# Patient Record
Sex: Female | Born: 1970 | Race: White | Hispanic: No | Smoking: Current every day smoker
Health system: Southern US, Community
[De-identification: ages and names within clinical notes are randomized; demographics above are authoritative.]

## PROBLEM LIST (undated history)

## (undated) ENCOUNTER — Emergency Department: Admission: EM | Payer: Self-pay | Source: Home / Self Care

## (undated) VITALS — BP 129/88 | HR 75 | Temp 97.7°F | Resp 18 | Ht 69.0 in | Wt 147.0 lb

## (undated) DIAGNOSIS — Z8669 Personal history of other diseases of the nervous system and sense organs: Secondary | ICD-10-CM

## (undated) DIAGNOSIS — R569 Unspecified convulsions: Secondary | ICD-10-CM

## (undated) DIAGNOSIS — G43909 Migraine, unspecified, not intractable, without status migrainosus: Secondary | ICD-10-CM

## (undated) DIAGNOSIS — G8929 Other chronic pain: Secondary | ICD-10-CM

## (undated) DIAGNOSIS — B019 Varicella without complication: Secondary | ICD-10-CM

## (undated) DIAGNOSIS — K219 Gastro-esophageal reflux disease without esophagitis: Secondary | ICD-10-CM

## (undated) DIAGNOSIS — G4700 Insomnia, unspecified: Secondary | ICD-10-CM

## (undated) DIAGNOSIS — F1910 Other psychoactive substance abuse, uncomplicated: Secondary | ICD-10-CM

## (undated) DIAGNOSIS — F419 Anxiety disorder, unspecified: Secondary | ICD-10-CM

## (undated) DIAGNOSIS — J449 Chronic obstructive pulmonary disease, unspecified: Secondary | ICD-10-CM

## (undated) DIAGNOSIS — M1990 Unspecified osteoarthritis, unspecified site: Secondary | ICD-10-CM

## (undated) DIAGNOSIS — F319 Bipolar disorder, unspecified: Secondary | ICD-10-CM

## (undated) DIAGNOSIS — M542 Cervicalgia: Secondary | ICD-10-CM

## (undated) DIAGNOSIS — F17200 Nicotine dependence, unspecified, uncomplicated: Secondary | ICD-10-CM

## (undated) DIAGNOSIS — Z789 Other specified health status: Secondary | ICD-10-CM

## (undated) DIAGNOSIS — E785 Hyperlipidemia, unspecified: Secondary | ICD-10-CM

## (undated) DIAGNOSIS — L0390 Cellulitis, unspecified: Secondary | ICD-10-CM

## (undated) DIAGNOSIS — F32A Depression, unspecified: Secondary | ICD-10-CM

## (undated) DIAGNOSIS — M549 Dorsalgia, unspecified: Secondary | ICD-10-CM

## (undated) DIAGNOSIS — F329 Major depressive disorder, single episode, unspecified: Secondary | ICD-10-CM

## (undated) DIAGNOSIS — E559 Vitamin D deficiency, unspecified: Secondary | ICD-10-CM

## (undated) HISTORY — DX: Insomnia, unspecified: G47.00

## (undated) HISTORY — DX: Unspecified convulsions: R56.9

## (undated) HISTORY — DX: Anxiety disorder, unspecified: F41.9

## (undated) HISTORY — DX: Chronic obstructive pulmonary disease, unspecified: J44.9

## (undated) HISTORY — DX: Cellulitis, unspecified: L03.90

## (undated) HISTORY — DX: Cervicalgia: M54.2

## (undated) HISTORY — DX: Migraine, unspecified, not intractable, without status migrainosus: G43.909

## (undated) HISTORY — DX: Vitamin D deficiency, unspecified: E55.9

## (undated) HISTORY — DX: Bipolar disorder, unspecified: F31.9

## (undated) HISTORY — PX: ESOPHAGOGASTRODUODENOSCOPY: SHX1529

## (undated) HISTORY — PX: ABDOMINAL HYSTERECTOMY: SHX81

## (undated) HISTORY — DX: Hyperlipidemia, unspecified: E78.5

## (undated) HISTORY — DX: Other psychoactive substance abuse, uncomplicated: F19.10

## (undated) HISTORY — DX: Varicella without complication: B01.9

## (undated) HISTORY — DX: Nicotine dependence, unspecified, uncomplicated: F17.200

## (undated) HISTORY — DX: Personal history of other diseases of the nervous system and sense organs: Z86.69

## (undated) HISTORY — PX: BREAST SURGERY: SHX581

## (undated) HISTORY — PX: CHOLECYSTECTOMY: SHX55

## (undated) HISTORY — DX: Gastro-esophageal reflux disease without esophagitis: K21.9

---

## 2000-03-18 ENCOUNTER — Encounter: Payer: Self-pay | Admitting: Gastroenterology

## 2000-03-18 ENCOUNTER — Ambulatory Visit (HOSPITAL_COMMUNITY): Admission: RE | Admit: 2000-03-18 | Discharge: 2000-03-18 | Payer: Self-pay | Admitting: Gastroenterology

## 2004-07-22 ENCOUNTER — Emergency Department: Payer: Self-pay | Admitting: Emergency Medicine

## 2004-09-11 ENCOUNTER — Emergency Department: Payer: Self-pay | Admitting: Emergency Medicine

## 2005-10-09 ENCOUNTER — Emergency Department: Payer: Self-pay | Admitting: Emergency Medicine

## 2005-11-24 ENCOUNTER — Ambulatory Visit: Payer: Self-pay | Admitting: Psychiatry

## 2005-11-24 ENCOUNTER — Inpatient Hospital Stay (HOSPITAL_COMMUNITY): Admission: EM | Admit: 2005-11-24 | Discharge: 2005-11-27 | Payer: Self-pay | Admitting: Psychiatry

## 2007-12-24 ENCOUNTER — Other Ambulatory Visit: Payer: Self-pay

## 2007-12-24 ENCOUNTER — Emergency Department: Payer: Self-pay | Admitting: Unknown Physician Specialty

## 2008-01-15 ENCOUNTER — Inpatient Hospital Stay (HOSPITAL_COMMUNITY): Admission: RE | Admit: 2008-01-15 | Discharge: 2008-01-19 | Payer: Self-pay | Admitting: *Deleted

## 2008-01-15 ENCOUNTER — Ambulatory Visit: Payer: Self-pay | Admitting: *Deleted

## 2008-02-19 ENCOUNTER — Ambulatory Visit: Payer: Self-pay | Admitting: Pain Medicine

## 2008-11-23 ENCOUNTER — Ambulatory Visit: Payer: Self-pay | Admitting: Pulmonary Disease

## 2008-11-23 ENCOUNTER — Inpatient Hospital Stay (HOSPITAL_COMMUNITY): Admission: EM | Admit: 2008-11-23 | Discharge: 2008-11-25 | Payer: Self-pay | Admitting: Emergency Medicine

## 2008-11-25 ENCOUNTER — Ambulatory Visit: Payer: Self-pay | Admitting: *Deleted

## 2008-11-25 ENCOUNTER — Inpatient Hospital Stay (HOSPITAL_COMMUNITY): Admission: AD | Admit: 2008-11-25 | Discharge: 2008-12-01 | Payer: Self-pay | Admitting: *Deleted

## 2009-01-25 ENCOUNTER — Emergency Department: Payer: Self-pay | Admitting: Emergency Medicine

## 2009-03-06 ENCOUNTER — Emergency Department: Payer: Self-pay | Admitting: Emergency Medicine

## 2009-03-08 ENCOUNTER — Ambulatory Visit: Payer: Self-pay | Admitting: Unknown Physician Specialty

## 2009-03-24 ENCOUNTER — Inpatient Hospital Stay (HOSPITAL_COMMUNITY): Admission: EM | Admit: 2009-03-24 | Discharge: 2009-04-02 | Payer: Self-pay | Admitting: Pulmonary Disease

## 2009-03-24 ENCOUNTER — Ambulatory Visit: Payer: Self-pay | Admitting: Pulmonary Disease

## 2009-07-04 ENCOUNTER — Emergency Department (HOSPITAL_COMMUNITY): Admission: EM | Admit: 2009-07-04 | Discharge: 2009-07-04 | Payer: Self-pay | Admitting: Emergency Medicine

## 2009-08-17 ENCOUNTER — Emergency Department: Payer: Self-pay | Admitting: Emergency Medicine

## 2009-09-12 ENCOUNTER — Ambulatory Visit: Payer: Self-pay | Admitting: Unknown Physician Specialty

## 2009-09-13 ENCOUNTER — Emergency Department: Payer: Self-pay | Admitting: Emergency Medicine

## 2009-10-06 ENCOUNTER — Ambulatory Visit: Payer: Self-pay | Admitting: Unknown Physician Specialty

## 2010-02-07 ENCOUNTER — Emergency Department: Payer: Self-pay | Admitting: Emergency Medicine

## 2011-01-12 LAB — PREGNANCY, URINE: Preg Test, Ur: NEGATIVE

## 2011-01-12 LAB — URINALYSIS, ROUTINE W REFLEX MICROSCOPIC
Bilirubin Urine: NEGATIVE
Glucose, UA: NEGATIVE mg/dL
Ketones, ur: NEGATIVE mg/dL
Protein, ur: NEGATIVE mg/dL
pH: 6 (ref 5.0–8.0)

## 2011-01-12 LAB — POCT I-STAT, CHEM 8
Calcium, Ion: 1.03 mmol/L — ABNORMAL LOW (ref 1.12–1.32)
HCT: 42 % (ref 36.0–46.0)
Hemoglobin: 14.3 g/dL (ref 12.0–15.0)
Sodium: 141 mEq/L (ref 135–145)
TCO2: 23 mmol/L (ref 0–100)

## 2011-01-12 LAB — ETHANOL: Alcohol, Ethyl (B): 18 mg/dL — ABNORMAL HIGH (ref 0–10)

## 2011-01-12 LAB — URINE MICROSCOPIC-ADD ON

## 2011-01-12 LAB — RAPID URINE DRUG SCREEN, HOSP PERFORMED
Benzodiazepines: POSITIVE — AB
Cocaine: POSITIVE — AB
Opiates: POSITIVE — AB

## 2011-01-12 LAB — POCT CARDIAC MARKERS: Myoglobin, poc: 53.7 ng/mL (ref 12–200)

## 2011-01-15 LAB — BASIC METABOLIC PANEL
BUN: 2 mg/dL — ABNORMAL LOW (ref 6–23)
BUN: 2 mg/dL — ABNORMAL LOW (ref 6–23)
CO2: 26 mEq/L (ref 19–32)
CO2: 27 mEq/L (ref 19–32)
CO2: 28 mEq/L (ref 19–32)
CO2: 30 mEq/L (ref 19–32)
CO2: 31 mEq/L (ref 19–32)
Calcium: 8.4 mg/dL (ref 8.4–10.5)
Calcium: 8.6 mg/dL (ref 8.4–10.5)
Chloride: 102 mEq/L (ref 96–112)
Chloride: 103 mEq/L (ref 96–112)
Creatinine, Ser: 0.54 mg/dL (ref 0.4–1.2)
Creatinine, Ser: 0.58 mg/dL (ref 0.4–1.2)
GFR calc Af Amer: >60 mL/min (ref >60)
GFR calc Af Amer: >60 mL/min (ref >60)
GFR calc Af Amer: >60 mL/min (ref >60)
GFR calc non Af Amer: >60 mL/min (ref >60)
GFR calc non Af Amer: >60 mL/min (ref >60)
Glucose, Bld: 117 mg/dL — ABNORMAL HIGH (ref 70–99)
Glucose, Bld: 87 mg/dL (ref 70–99)
Potassium: 3.3 mEq/L — ABNORMAL LOW (ref 3.5–5.1)
Potassium: 4.3 mEq/L (ref 3.5–5.1)
Sodium: 137 mEq/L (ref 135–145)
Sodium: 137 mEq/L (ref 135–145)
Sodium: 139 mEq/L (ref 135–145)

## 2011-01-15 LAB — URINE CULTURE: Culture: NO GROWTH

## 2011-01-15 LAB — RETICULOCYTES
RBC.: 3.05 MIL/uL — ABNORMAL LOW (ref 3.87–5.11)
Retic Ct Pct: 0.6 % (ref 0.4–3.1)

## 2011-01-15 LAB — CBC
HCT: 27.6 % — ABNORMAL LOW (ref 36.0–46.0)
HCT: 28.8 % — ABNORMAL LOW (ref 36.0–46.0)
HCT: 29.3 % — ABNORMAL LOW (ref 36.0–46.0)
HCT: 32.2 % — ABNORMAL LOW (ref 36.0–46.0)
Hemoglobin: 10.7 g/dL — ABNORMAL LOW (ref 12.0–15.0)
Hemoglobin: 10.8 g/dL — ABNORMAL LOW (ref 12.0–15.0)
Hemoglobin: 9.8 g/dL — ABNORMAL LOW (ref 12.0–15.0)
Hemoglobin: 9.9 g/dL — ABNORMAL LOW (ref 12.0–15.0)
MCHC: 33.1 g/dL (ref 30.0–36.0)
MCHC: 33.4 g/dL (ref 30.0–36.0)
MCHC: 33.9 g/dL (ref 30.0–36.0)
MCHC: 34.1 g/dL (ref 30.0–36.0)
MCHC: 34.1 g/dL (ref 30.0–36.0)
MCV: 91.7 fL (ref 78.0–100.0)
MCV: 91.9 fL (ref 78.0–100.0)
MCV: 92.8 fL (ref 78.0–100.0)
MCV: 93.3 fL (ref 78.0–100.0)
MCV: 93.6 fL (ref 78.0–100.0)
Platelets: 376 10*3/uL (ref 150–400)
Platelets: 399 10*3/uL (ref 150–400)
RBC: 2.93 MIL/uL — ABNORMAL LOW (ref 3.87–5.11)
RBC: 3.1 MIL/uL — ABNORMAL LOW (ref 3.87–5.11)
RBC: 3.16 MIL/uL — ABNORMAL LOW (ref 3.87–5.11)
RBC: 3.16 MIL/uL — ABNORMAL LOW (ref 3.87–5.11)
RBC: 3.39 MIL/uL — ABNORMAL LOW (ref 3.87–5.11)
RDW: 15.2 % (ref 11.5–15.5)
RDW: 15.4 % (ref 11.5–15.5)
RDW: 15.4 % (ref 11.5–15.5)
RDW: 15.9 % — ABNORMAL HIGH (ref 11.5–15.5)
WBC: 15.5 10*3/uL — ABNORMAL HIGH (ref 4.0–10.5)

## 2011-01-15 LAB — LEGIONELLA ANTIGEN, URINE: Legionella Antigen, Urine: NEGATIVE

## 2011-01-15 LAB — COMPREHENSIVE METABOLIC PANEL
ALT: 11 U/L (ref 0–35)
AST: 37 U/L (ref 0–37)
Albumin: 2.4 g/dL — ABNORMAL LOW (ref 3.5–5.2)
BUN: <1 mg/dL — ABNORMAL LOW (ref 6–23)
CO2: 24 mEq/L (ref 19–32)
Chloride: 103 mEq/L (ref 96–112)
Chloride: 99 mEq/L (ref 96–112)
Creatinine, Ser: 0.54 mg/dL (ref 0.4–1.2)
Creatinine, Ser: 0.58 mg/dL (ref 0.4–1.2)
GFR calc Af Amer: >60 mL/min (ref >60)
GFR calc non Af Amer: >60 mL/min (ref >60)
Glucose, Bld: 136 mg/dL — ABNORMAL HIGH (ref 70–99)
Sodium: 138 mEq/L (ref 135–145)
Total Bilirubin: 0.2 mg/dL — ABNORMAL LOW (ref 0.3–1.2)
Total Bilirubin: 0.4 mg/dL (ref 0.3–1.2)
Total Protein: 5.7 g/dL — ABNORMAL LOW (ref 6.0–8.3)

## 2011-01-15 LAB — DIFFERENTIAL
Basophils Absolute: 0 10*3/uL (ref 0.0–0.1)
Lymphocytes Relative: 13 % (ref 12–46)
Monocytes Absolute: 0.3 10*3/uL (ref 0.1–1.0)
Neutro Abs: 5.6 10*3/uL (ref 1.7–7.7)

## 2011-01-15 LAB — IRON AND TIBC: UIBC: 149 ug/dL

## 2011-01-15 LAB — CULTURE, BLOOD (ROUTINE X 2)

## 2011-01-15 LAB — MAGNESIUM: Magnesium: 1.4 mg/dL — ABNORMAL LOW (ref 1.5–2.5)

## 2011-01-23 LAB — CBC
HCT: 32.9 % — ABNORMAL LOW (ref 36.0–46.0)
Hemoglobin: 12.8 g/dL (ref 12.0–15.0)
MCHC: 35 g/dL (ref 30.0–36.0)
MCHC: 35.1 g/dL (ref 30.0–36.0)
MCV: 93.4 fL (ref 78.0–100.0)
MCV: 94.1 fL (ref 78.0–100.0)
Platelets: 170 10*3/uL (ref 150–400)
RBC: 3.49 MIL/uL — ABNORMAL LOW (ref 3.87–5.11)
RBC: 3.93 MIL/uL (ref 3.87–5.11)
RBC: 3.97 MIL/uL (ref 3.87–5.11)
RBC: 4.1 MIL/uL (ref 3.87–5.11)
RDW: 13.7 % (ref 11.5–15.5)
WBC: 5.8 10*3/uL (ref 4.0–10.5)
WBC: 6.6 10*3/uL (ref 4.0–10.5)

## 2011-01-23 LAB — POCT PREGNANCY, URINE: Preg Test, Ur: NEGATIVE

## 2011-01-23 LAB — TRICYCLICS SCREEN, URINE: TCA Scrn: NOT DETECTED

## 2011-01-23 LAB — COMPREHENSIVE METABOLIC PANEL
ALT: 42 U/L — ABNORMAL HIGH (ref 0–35)
AST: 25 U/L (ref 0–37)
CO2: 26 mEq/L (ref 19–32)
Chloride: 111 mEq/L (ref 96–112)
GFR calc Af Amer: >60 mL/min (ref >60)
GFR calc non Af Amer: >60 mL/min (ref >60)
Sodium: 145 mEq/L (ref 135–145)
Total Bilirubin: 0.4 mg/dL (ref 0.3–1.2)

## 2011-01-23 LAB — BASIC METABOLIC PANEL
BUN: 5 mg/dL — ABNORMAL LOW (ref 6–23)
CO2: 25 mEq/L (ref 19–32)
CO2: 25 mEq/L (ref 19–32)
Calcium: 8.7 mg/dL (ref 8.4–10.5)
Chloride: 101 mEq/L (ref 96–112)
Chloride: 111 mEq/L (ref 96–112)
Creatinine, Ser: 0.62 mg/dL (ref 0.4–1.2)
GFR calc Af Amer: >60 mL/min (ref >60)
GFR calc Af Amer: >60 mL/min (ref >60)
GFR calc non Af Amer: >60 mL/min (ref >60)
Glucose, Bld: 84 mg/dL (ref 70–99)
Potassium: 3.4 mEq/L — ABNORMAL LOW (ref 3.5–5.1)
Potassium: 3.5 mEq/L (ref 3.5–5.1)
Sodium: 141 mEq/L (ref 135–145)

## 2011-01-23 LAB — PROTIME-INR
Prothrombin Time: 12.5 seconds (ref 11.6–15.2)
Prothrombin Time: 13.6 seconds (ref 11.6–15.2)

## 2011-01-23 LAB — URINALYSIS, ROUTINE W REFLEX MICROSCOPIC
Glucose, UA: NEGATIVE mg/dL
Protein, ur: NEGATIVE mg/dL
Urobilinogen, UA: 0.2 mg/dL (ref 0.0–1.0)

## 2011-01-23 LAB — POCT I-STAT 3, ART BLOOD GAS (G3+)
Acid-Base Excess: 3 mmol/L — ABNORMAL HIGH (ref 0.0–2.0)
O2 Saturation: 100 %
Patient temperature: 97.7

## 2011-01-23 LAB — LACTIC ACID, PLASMA: Lactic Acid, Venous: 2.4 mmol/L — ABNORMAL HIGH (ref 0.5–2.2)

## 2011-01-23 LAB — PHOSPHORUS: Phosphorus: 3.3 mg/dL (ref 2.3–4.6)

## 2011-01-23 LAB — DIFFERENTIAL
Basophils Absolute: 0 10*3/uL (ref 0.0–0.1)
Eosinophils Absolute: 0.2 10*3/uL (ref 0.0–0.7)
Eosinophils Relative: 3 % (ref 0–5)
Lymphs Abs: 2.1 10*3/uL (ref 0.7–4.0)

## 2011-01-23 LAB — POCT CARDIAC MARKERS
CKMB, poc: <1 ng/mL — ABNORMAL LOW (ref 1.0–8.0)
Myoglobin, poc: 57.9 ng/mL (ref 12–200)
Troponin i, poc: <0.05 ng/mL (ref 0.00–0.09)

## 2011-01-23 LAB — MAGNESIUM: Magnesium: 2.4 mg/dL (ref 1.5–2.5)

## 2011-01-23 LAB — ACETAMINOPHEN LEVEL: Acetaminophen (Tylenol), Serum: <10 ug/mL — ABNORMAL LOW (ref 10–30)
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
## 2011-02-20 NOTE — Consult Note (Signed)
Erin [name], Erin [name]                ACCOUNT NO.:  [id number]   MEDICAL RECORD NO.:  [id number]          PATIENT TYPE:  INP   LOCATION:  3730                         FACILITY:  [facility]   PHYSICIAN:  Antonietta Breach, M.D.  DATE OF BIRTH:  1971/07/13   DATE OF CONSULTATION:  03/29/2009  DATE OF DISCHARGE:                                 CONSULTATION   REASON FOR CONSULTATION:  Labile mood.   REQUESTING PHYSICIAN:  Felipa Evener, MD   HISTORY OF PRESENT ILLNESS:  Ms. Erin [name] is a 40 year old female  admitted to the [facility] on June 17 due to pneumonia.   She has experienced low energy with her pneumonia.  She was displaying  some irritability over the past 2 days.  Her irritability has subsided  today.  She is discussing constructive future goals and interests.   Her physical discomfort has improved which is correlated with her  improvement in mood.  She has not been violent.  She has no  hallucinations or delusions.  She is not having any racing thoughts or  pressured speech.   Her orientation and memory function are intact.  She has been maintained  on her Prozac 20 mg daily along with Lamictal 25 mg daily and Seroquel  200 mg q.h.s.   She has been displaying some feeling on edge and muscle tension.  She  has been receiving Valium 10 mg t.i.d. p.r.n. in the hospital as she  takes it at home and she has required less than 30 mg per day.   PAST PSYCHIATRIC HISTORY:  Ms. Erin [name] does have a history of suicide  attempts.  She does have a history of severe depression.   In review of the past medical record, she was last admitted to the [facility] in February 2010 which was her third admission  to the [facility].  At that time, she had overdosed  on multiple medications.   She does have a history of panic attacks requiring benzodiazepine  therapy.   She also has a history of alcohol dependence.  She has received 2 DWIs.  She also  required 7 days of jail and 18 months' probation.   FAMILY PSYCHIATRIC HISTORY:  Her father has had difficulty with alcohol  and mood symptoms.  The specifics are not known.   SOCIAL HISTORY:  She has a 79-year-old daughter and a common-law husband.  She worked as a Interior and spatial designer.  She does not use any illegal drugs.   PAST MEDICAL HISTORY:  Bilateral pneumonia, anemia.   MEDICATIONS:  Her MAR is reviewed.  Her psychotropics include:  1. Prozac 20 mg daily.  2. Lamictal 25 mg daily.  3. Seroquel 200 mg q.h.s.  4. Valium 10 mg q.8 h. p.r.n.   ALLERGIES:  SULFA.   LABORATORY DATA:  Sodium 137, BUN 2, creatinine 0.68, glucose 92.  WBC  7.4, hemoglobin 9.9, platelet count 423,000.  Folic acid normal, B12  normal.  SGOT 51, SGPT 22.   REVIEW OF SYSTEMS:  CONSTITUTIONAL, HEAD, EYES, EARS, NOSE, AND THROAT,  MOUTH, NEUROLOGIC:  Unremarkable.  PSYCHIATRIC:  Ms. Erin [name] was on  Wellbutrin with her Prozac for antidepression in March as prescribed by  the [facility].  However, she had side effects with  the Wellbutrin including increased feeling on edge and tremor.  The  Wellbutrin was discontinued.  Also, she has been followed by an  outpatient psychiatrist in [place].  CARDIOVASCULAR, RESPIRATORY,  GASTROINTESTINAL, GENITOURINARY, SKIN, MUSCULOSKELETAL, HEMATOLOGIC,  LYMPHATIC, ENDOCRINE, METABOLIC:  All unremarkable.   EXAMINATION:  VITAL SIGNS:  Temperature 99.0, pulse 94, respiratory rate  14, blood pressure 100/70, O2 saturation 2 liters 82%.  GENERAL APPEARANCE:  Ms. Erin [name] is a middle-aged female lying in a  partially reclined supine position in her hospital bed with no abnormal  involuntary movements.   MENTAL STATUS EXAM:  Ms. Erin [name] is alert.  Her eye contact is good.  Her  attention span is normal.  Her affect is mildly flat at baseline but  with a broad and appropriate range.  Her mood is slightly anxious.  Her  concentration is within normal limits.  She  is oriented to all spheres.  Her memory is intact to immediate, recent, and remote.  Her fund of  knowledge and intelligence are normal.  Her speech involves normal rate  and prosody without dysarthria.  Thought process is logical, coherent,  goal-directed.  No looseness of associations.  Thought content:  No  thoughts of harming herself or others.  No delusions or hallucinations.  Her insight is intact.  Her judgment is intact.   ASSESSMENT:  AXIS I:  293.83  Mood disorder not otherwise specified  (idiopathic baseline with general medical acute factors), depressed, now  improved.  This category is used to emphasize that Ms. Erin [name] has  experienced acute factors such as requiring opioid medication and  elevated cytokines of her infection on top of her anemia.  As her  general medical care has progressed, her irritability has improved.  293.84  Anxiety disorder not otherwise specified, rule out panic  disorder.  This is now stable.  She is still requiring some p.r.n.  Valium and could benefit from a course of cognitive behavioral therapy.  Major depressive disorder, stable.  Alcohol dependence.  AXIS II:  Deferred.  AXIS III:  See past medical history.  AXIS IV:  General medical.  AXIS V:  55.   Ms. Erin [name] is not at risk to harm herself or others.  She agrees to call  Emergency Services immediately for any thoughts of harming herself,  thoughts of harming others, or distress.   The undersigned provided ego supportive psychotherapy and education.   The indications, alternatives, and adverse effects of her psychotropic  medication were discussed.  The patient understands and wants to proceed  with the following.   RECOMMENDATIONS:  She will continue on her Seroquel 200 mg q.h.s. with  Valium 5-10 mg t.i.d. p.r.n. anxiety, Prozac 20 mg daily augmented with  Lamictal 25 mg daily for antidepression.   Would continue to monitor for any rash regarding the Lamictal and would  use the  Valium judiciously with the goal of discontinuing the Valium due  to its risk of dependence.   In order to eliminate the need for benzodiazepine therapy, she could  benefit from a psychotherapy course of cognitive behavioral therapy  combined with deep breathing and progressive muscle relaxation.   Would ask the social worker to set Ms. Erin [name] up with outpatient  psychiatric followup within the first week of discharge.   Would also  recommend a course of psychotherapy as discussed above.   DISCUSSION:  In addition to reducing her need for benzodiazepine  therapy, cognitive behavioral therapy has been helpful with insomnia.  At this time, she is requiring Seroquel which can have adverse long-term  side effects.  With cognitive behavioral therapy, alternative  pharmacotherapy could be re-tried in order to get her off of the  Seroquel.  Therapy could be coordinated between the psychiatrist and the  psychotherapist.      Antonietta Breach, M.D.  Electronically Signed     JW/MEDQ  D:  03/29/2009  T:  03/29/2009  Job:  [id number]

## 2011-02-20 NOTE — H&P (Signed)
NAMEMARGEAN, Erin [name]                ACCOUNT NO.:  [id number]   MEDICAL RECORD NO.:  [id number]          PATIENT TYPE:  IPS   LOCATION:  0506                          FACILITY:  [facility]   PHYSICIAN:  Jasmine Pang, M.D. DATE OF BIRTH:  August 24, 1971   DATE OF ADMISSION:  11/25/2008  DATE OF DISCHARGE:                       PSYCHIATRIC ADMISSION ASSESSMENT   TIME:  1200.   IDENTIFYING INFORMATION:  A 40 year old Caucasian female, voluntary  admission.   HISTORY OF PRESENT ILLNESS:  Third [facility] admission for this patient who  was transferred from the medical unit where she was admitted on November 23, 2008 after a polypharmacy overdose of approximately 75 tablets of  Seroquel 50 mg; 90 tablets of Valium 10 mg and 30 tablets of Prozac 40  mg.  She endorses drinking alcohol on the night that the overdose  occurred, but she is unable to remember exactly what triggered the  overdose.  She reports that her boyfriend of 7 years told her that they  had argued that night, but she has no memory of it.  She does endorse  some recent depressed mood, being stressed by being out of work for the  past 10 months and her boyfriend is also out of work due to the  inclement winter weather, and they have had significant financial  stressors.  She says she has mixed feelings today about surviving the  overdose, part of her wishes that she had succeeded and another part of  her is glad that she is alive to care for her 47-year-old daughter.   PAST PSYCHIATRIC HISTORY:  Third [facility] admission.  She has a history of  previous admission in February 2007 to address problems of anxiety,  agitation and increased tearfulness.  Had decreased sleep at that time  for 3 weeks and describes panic attacks.  She has a history of alcohol  abuse  and received her second DWI.  In October 2009, went to jail for 7  days and is currently on 18 months probation.  She is currently  minimizing her alcohol use as it is only possibly a  glass of wine once  or twice a week.  She endorses depressive mood disorder and has a  history of prior suicidal thoughts, but no prior suicide attempts.   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  They have a 39-year-old daughter together. Currently endorsing  financial stressors since her boyfriend is unable to do his roofing job  in the inclement winter weather, 18 months probation for her second DWI  and probation started October 2009.   FAMILY HISTORY:  Remarkable for father with mood problems and alcohol  abuse.   MEDICAL HISTORY:  Primary care [name] is Dr. Mylinda Latina in [place], [place].   MEDICAL PROBLEMS:  1. Back pain NOS.  2. Acute respiratory failure secondary to polypharmacy overdose.  3. Polypharmacy overdose.   MEDICATIONS PRIOR TO ADMISSION:  1. Valium 10 mg t.i.d.  2. Prozac 40 mg daily  taking for about 5 months.  3. Percocet 1-2 tablets as needed for back pain which she states she      does not have a current prescription.  4. Seroquel 50 mg b.i.d. as needed and routinely took 100 mg p.o.      q.h.s.   DRUG ALLERGIES:  SULFA.   PHYSICAL EXAMINATION:  See the physical exam and medical history  dictated by Dr. Molli Knock on the medical unit and his complete discharge  summary dated November 25, 2008.   LABORATORY DATA:  Urine drug screen was positive for benzodiazepines and  initial blood alcohol level was 285 mg/dL.   MENTAL STATUS EXAM:  Fully alert female, pleasant, cooperative.  She has  a very poor memory of the events surrounding her overdose.  Endorses  having some conflict and depressed mood.  Endorsing financial stresses  at home.  Speech is normal.  Gives a coherent history with memory lapses  related to the precipitating event.  Mood is mildly irritable.  Thought  process logical, coherent.  No signs of delusion or internal   distractions.  No homicidal thoughts.  Endorsing passive SI.  Cognition,  immediate and distant memory are intact.  Her recent intact other than  events of overdose.   AXIS I:  Depressive disorder, not otherwise specified.  Alcohol abuse  rule out dependence.  AXIS III:  Status post polypharmacy overdose.  AXIS IV:  Relationship conflict, not otherwise specified.  AXIS V:  Current 44, past year not known.   PLAN:  The plan is to voluntarily admit her with a goal of alleviating  her suicidal thoughts.  We will not restart her Valium and I have placed  her on a Librium taper to follow up the Ativan taper started on the  medical unit.  We will increase her Prozac by 10 mg a day to 50 mg daily  and we will consider the need for a mood stabilizer.  Meanwhile, we hope  to get a family session with her boyfriend and get some additional  history.  She is currently followed by Dr. Janeece Riggers at [facility] [place] and we will plan to have her followup  there.      Margaret A. Lorin Picket, N.P.      Jasmine Pang, M.D.  Electronically Signed    MAS/MEDQ  D:  11/26/2008  T:  11/26/2008  Job:  [id number]

## 2011-02-20 NOTE — Discharge Summary (Signed)
Erin [name], Erin [name]                ACCOUNT NO.:  [id number]   MEDICAL RECORD NO.:  [id number]          PATIENT TYPE:  INP   LOCATION:  5009                         FACILITY:  [facility]   PHYSICIAN:  Beckey Rutter, MD  DATE OF BIRTH:  1971-04-12   DATE OF ADMISSION:  03/24/2009  DATE OF DISCHARGE:  04/02/2009                               DISCHARGE SUMMARY   BRIEF HISTORY OF PRESENT ILLNESS:  This is a 40 year old Caucasian  female, transferred to our hospital from small hospital in [place] with  pneumonia, respiratory distress.   HOSPITAL COURSE:  1. During hospital stay, the patient was treated for pneumonia with      good improvement.  Her white count is normal and there is no fever      for the last 3 days.  2. Pain issue.  The patient was requesting multiple medications for      pain control.  It was felt that the pain issue is secondary to      diagnosis #3, which is the depression.  3. Depression, please see the psychiatric consultation.  4. Evidence of iron-deficiency anemia.   It was felt that the overriding diagnosis to her problem is the  depression and the psychiatric problems.  The patient will be discharged  with sedative and psychotic plan.   DISCHARGE DIAGNOSES:  1. Psychiatric disorder/depression.  2. Pneumonia, resolved.  3. Chronic back pain, not otherwise specified.  4. Acute respiratory failure secondary to polypharmacy overdose.  5. History of polypharmacy overdose.   DISCHARGE MEDICATIONS:  1. Colace 100 mg p.o. b.i.d. p.r.n.  2. Ferrous sulfate 325 mg p.o. q.12h.  3. Prozac 20 mg daily.  4. Folic acid 1 mg daily.  5. Lamictal 25 mg p.o. daily.  6. Seroquel 200 mg p.o. at bedtime.  7. Albuterol MDI q.2-4 h. p.r.n.  8. Percocet 5/325 two tabs p.o. q.6 h. p.r.n.  9. Tussionex suspension 5 mL p.o. q.12 h. p.r.n.  10.Valium 5 mg p.o. q.8 h. p.r.n.   DISCHARGE PLAN:  The patient should follow up with her psychiatrist.  She stated she has an  appointment with the psychiatrist on June 29.  She  should follow up with her primary physician and discussed with her.  I  discussed the discharge plan with the patient thoroughly and also for  more than 20 minutes over the phone with her mom.  She is stable for  discharge.      Beckey Rutter, MD  Electronically Signed     EME/MEDQ  D:  04/02/2009  T:  04/02/2009  Job:  [phone]
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
## 2011-02-23 NOTE — Procedures (Signed)
[facility]  Patient:    Erin [name], Erin [name]                       MRN: [id number] Proc. Date: 03/18/00 Adm. Date:  [id number] Disc. Date: [id number] Attending:  Deneen Harts CC:         Dr. Sullivan Lone, [facility]             Cardiologist, [facility]                           Procedure Report  PROCEDURE:  Panendoscopy.  INDICATION:  A 40 year old white female undergoing endoscopy to evaluate symptoms of recurrent chest pain.  Evaluated by three different cardiologists without etiology.  Thought to have probable intermittent esophageal spasm based on symptoms, negative cardiology evaluation, failure to respond to Prevacid.  Prescribed NuLev at the time of her initial evaluation three weeks ago.  This has offered symptomatic improvement.  Undergoing endoscopy to rule out upper GI pathology.  DESCRIPTION OF PROCEDURE:  After reviewing the nature of the procedure with the patient, including potential risks and complications, and after discussing alternative methods of diagnosis and treatment, informed consent was signed.  Patient premedicated, receiving IV sedation totalling Versed 10 mg, fentanyl 100 mcg administered IV in divided doses prior to the onset of the procedure.  Using Olympus video endoscope, the patient was intubated under direct vision. Normal oropharynx, no lesion of the epiglottis, vocal cords, or piriform sinus.  The proximal, mid-, and distal segments of the esophagus were entirely normal.  The mucosal Z-line distinct and at 40 cm.  No hiatal hernia.  No evidence of reflux-induced inflammation.  No neoplasia, no infection.  No hiatal hernia appreciated.  Gastric fundus, body, and antrum normal.  Pylorus symmetric.  Duodenal bulb and second portion normal.  Retroflexed view of the angularis, lesser curve, gastric cardia, and fundus without findings.  Stomach decompressed, scope withdrawn.  The patient tolerated the  procedure without difficulty, being maintained on Dayscope monitor, low-flow oxygen throughout.  ASSESSMENT:  Normal endoscopy to chest pain, suspect functional etiology given normal endoscopy, and response to NuLev.  RECOMMENDATIONS: 1. Continue NuLev p.r.n. 2. Antireflux measures. 3. ROV p.r.n. DD:  03/18/00 TD:  03/21/00 Job: [id number]

## 2011-02-23 NOTE — Discharge Summary (Signed)
NAMESADY, MONACO                ACCOUNT NO.:  [id number]   MEDICAL RECORD NO.:  [id number]          PATIENT TYPE:  IPS   LOCATION:  0307                          [facility]ACILITY:  [facility]   PHYSICIAN:  Anselm Jungling, MD  DATE OF BIRTH:  Apr 11, 1971   DATE OF ADMISSION:  11/23/2005  DATE OF DISCHARGE:  11/27/2005                                 DISCHARGE SUMMARY   IDENTIFYING DATA/REASON FOR ADMISSION:  The patient is a 40 year old single  white female who was admitted on a voluntary basis. She requested help with  her moods. She described anxiety, agitation, and increasing tearfulness for  three weeks with decreased sleep, increase in cigarette smoking, panic  attacks, and feeling out of control. She also had had some suicidal  ideation. She felt hopeless and helpless. Please refer to the admission note  for further details pertaining to the symptoms, circumstances and history  that led to her hospitalization. This was her first inpatient  hospitalization ever.   INITIAL DIAGNOSTIC IMPRESSION:  She was given an initial AXIS I diagnosis of  rule out bipolar disorder, mixed state.   MEDICAL/LABORATORY:  The patient was medically and physically assessed by  the psychiatric nurse practitioner. She had a history of taking Percocet  10/650 p.o. q.i.d. for chronic pain and described allergy to SULFA. There  were no significant medical issues during this brief inpatient psychiatric  stay. She was continued on her usual Percocet.   HOSPITAL COURSE:  The patient was admitted to the adult inpatient  psychiatric service where she participated in various therapeutic groups,  activities and classes. She presented as a well-nourished, normally  developed adult female who was pleasant and cooperative throughout her  inpatient stay. She came to [place] on a course of Zoloft, and was continued on a  dose of 100 mg daily. To address anxiety, Valium in doses of 5-10 mg was  ordered. She was started on a trial  of Lamictal 25 mg daily to address  bipolar, mixed state. Wellbutrin XL 150 mg q.a.m. was given, along with  Zyprexa 2.5 mg q.p.m.   Zoloft was subsequently tapered to 50 mg daily, and Zyprexa was increased to  5 mg q.p.m.   The patient reported feeling better over the course of her inpatient stay.  She was absent suicidal ideation throughout. On the fifth hospital day, she  indicated that she felt ready for discharge.   AFTERCARE:  The patient was to follow-up with her therapist, Clement Sayres, on November 28, 2005, the day following discharge. For  medications, she was to follow-up with Dr. Lang Snow at the [facility] on  November 29, 2005.   DISCHARGE MEDICATIONS:  1.  Valium 5 mg q.a.m. and 10 mg q.p.m.  2.  Lamictal 25 mg daily.  3.  Wellbutrin XL 150 mg daily.  4.  Zoloft 50 mg daily.  5.  Zyprexa 5 mg q.h.s.  6.  Ambien 10 mg at h.s. p.r.n. insomnia.  7.  Percocet 10/625 q.i.d.   DISCHARGE DIAGNOSES:  AXIS I:  Bipolar disorder, type 2, most recently  mixed.  AXIS II:  Deferred.  AXIS III:  No acute or chronic illnesses.  AXIS IV:  Stressors:  Severe.  AXIS V:  GAF on discharge 65.           ______________________________  Anselm Jungling, MD  Electronically Signed     SPB/MEDQ  D:  11/28/2005  T:  11/28/2005  Job:  [id number]
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
## 2011-02-23 NOTE — Discharge Summary (Signed)
Erin [name], Erin [name] NO.:  [id number]   MEDICAL RECORD NO.:  [id number]          PATIENT TYPE:  IPS   LOCATION:  0304                          FACILITY:  [facility]   PHYSICIAN:  Jasmine Pang, M.D. DATE OF BIRTH:  02/15/1971   DATE OF ADMISSION:  01/15/2008  DATE OF DISCHARGE:  01/19/2008                               DISCHARGE SUMMARY   IDENTIFICATION:  This is a 40 year old single white female from  [place], [place], who was admitted on January 15, 2008.   HISTORY OF PRESENT ILLNESS:  The patient reports a history of depression  with suicidal ideation.  She states that she is having passive suicidal  ideation with severe depressive symptoms.  She states she has lost  interest in everything.  She is not working or cleaning herself, not  bathing.  The symptoms have gotten progressively worse over the past 4-6  months.  She states that she does not like her current medications.   PAST PSYCHIATRIC HISTORY:  The patient was here 3 years ago for  depression in the past.  She is treated with Zoloft for 6 years.  She  tapered herself off pristiq.  She has no outpatient therapy at this  point.   FAMILY HISTORY:  Father suicided 7 years ago, he had problems with  alcohol and shot himself.   MEDICAL PROBLEMS:  Pain for which she is going to [facility].   MEDICATIONS:  1. Lovastatin 20 mg.  2. Valium 10 mg t.i.d.  3. Seroquel 50 mg h.s.  4. Gabapentin 300 mg p.o. q.h.s.  5. Oxycodone 10 mg p.r.n. q.4-6 h. pain.   PHYSICAL FINDINGS:  There were no acute physical or medical problems  noted.   HOSPITAL COURSE:  Upon admission, the patient was restarted on  lovastatin 20 mg daily, Endocet 10/650 mg q.i.d., diazepam 10 mg p.o.  t.i.d., Seroquel 50 mg p.o. q.h.s. gabapentin 300 mg p.o. q.h.s.  She  was also given 21 mg nicotine patch as per smoking cessation protocol.  On January 16, 2008, Valium was decreased to 10 mg b.i.d. and 5 mg q.h.s.  She was also started on Wellbutrin XL 150 mg daily.  In the individual  sessions with me, the patient was friendly and cooperative.  She also  participated appropriately in unit therapeutic groups and activities.  She denied any stressors.  She discussed just sitting on my couch and  being vegetative.  She came for help because her mother and her  boyfriend wanted her to.  She had been on pristiq, but took herself off.  She has a bulging disk and is on pain medicines.  As hospitalization  progressed, mental status improved.  The patient became less depressed  and less anxious.  Her back was hurting, and she was anxious about  getting into pain management clinic as soon as possible.  She has an  appointment May 14, in [place], but states she would come to  [place], she could get an earlier appointment.  She is very active in  the milieu.  There was some  discussion about conflict with her  boyfriend, but this did not appear to be severe.  On January 19, 2008,  mental status had improved markedly from admission status.  Her sleep  was good, appetite was good, mood was less depressed, and less anxious.  Affect consistent with mood.  There was no suicidal or homicidal  ideation.  No thoughts of self-injurious behavior.  Cognitive was  grossly back to baseline.  The patient discussed family issues and  indicated she had a supportive mother who she felt would help her when  she left the hospital.  She was felt to be safe for discharge.  The  patient's Wellbutrin was also increased to 300 mg p.o. daily.  As the  patient's Wellbutrin XL was also increased to 300 mg daily, she was also  started on Neurontin 300 mg p.o. q.i.d. p.r.n. breakthrough pain.   DISCHARGE DIAGNOSES:  Axis I:  Depressive disorder, not otherwise  specified.  Axis II:  None.  Axis III:  Back pain.  Axis IV:  Mild-to-moderate (medical problems).  Axis V:  Global assessment of functioning was 50 upon discharge.  GAF  was 35  upon admission.  GAF highest past year was 65.   DISCHARGE PLAN:  There was no specific activity level or dietary  restriction.   POSTHOSPITAL CARE PLANS:  The patient will be followed up by the [facility] on May 18 at 10 a.m.  She will also follow up at [facility] in 2 weeks on April 21 at 11:30 a.m. for her back  pain.   DISCHARGE MEDICATIONS:  1. Wellbutrin XL 300 mg p.o. daily.  2. Valium 10 mg p.o. b.i.d. and 5 mg at bedtime.  3. Lovastatin 20 mg daily.  4. Neurontin 300 mg at bedtime.  5. Endocet 10/650 mg 4 times a day.      Jasmine Pang, M.D.  Electronically Signed     BHS/MEDQ  D:  02/19/2008  T:  02/19/2008  Job:  [id number]

## 2011-07-27 ENCOUNTER — Emergency Department (HOSPITAL_COMMUNITY): Payer: Medicaid Other

## 2011-07-27 ENCOUNTER — Emergency Department (HOSPITAL_COMMUNITY)
Admission: EM | Admit: 2011-07-27 | Discharge: 2011-07-27 | Disposition: A | Discharge status: Home / Self Care | Payer: Medicaid Other | Attending: Emergency Medicine | Admitting: Emergency Medicine

## 2011-07-27 DIAGNOSIS — R110 Nausea: Secondary | ICD-10-CM | POA: Insufficient documentation

## 2011-07-27 DIAGNOSIS — F1120 Opioid dependence, uncomplicated: Secondary | ICD-10-CM | POA: Insufficient documentation

## 2011-07-27 DIAGNOSIS — F19939 Other psychoactive substance use, unspecified with withdrawal, unspecified: Secondary | ICD-10-CM | POA: Insufficient documentation

## 2011-07-27 DIAGNOSIS — R569 Unspecified convulsions: Secondary | ICD-10-CM | POA: Insufficient documentation

## 2011-07-27 DIAGNOSIS — M545 Low back pain, unspecified: Secondary | ICD-10-CM | POA: Insufficient documentation

## 2011-07-27 DIAGNOSIS — F29 Unspecified psychosis not due to a substance or known physiological condition: Secondary | ICD-10-CM | POA: Insufficient documentation

## 2011-07-27 LAB — URINALYSIS, ROUTINE W REFLEX MICROSCOPIC
Bilirubin Urine: NEGATIVE
Glucose, UA: NEGATIVE mg/dL
Ketones, ur: NEGATIVE mg/dL
Leukocytes, UA: NEGATIVE
pH: 6.5 (ref 5.0–8.0)

## 2011-07-27 LAB — URINE MICROSCOPIC-ADD ON

## 2011-07-27 LAB — POCT I-STAT, CHEM 8
BUN: 4 mg/dL — ABNORMAL LOW (ref 6–23)
Calcium, Ion: 1.17 mmol/L (ref 1.12–1.32)
Chloride: 107 meq/L (ref 96–112)
Creatinine, Ser: 0.7 mg/dL (ref 0.50–1.10)
Glucose, Bld: 135 mg/dL — ABNORMAL HIGH (ref 70–99)
HCT: 46 % (ref 36.0–46.0)
Hemoglobin: 15.6 g/dL — ABNORMAL HIGH (ref 12.0–15.0)
Potassium: 3.9 meq/L (ref 3.5–5.1)
Sodium: 139 meq/L (ref 135–145)
TCO2: 21 mmol/L (ref 0–100)

## 2011-07-27 LAB — CBC
Hemoglobin: 15.4 g/dL — ABNORMAL HIGH (ref 12.0–15.0)
MCH: 33 pg (ref 26.0–34.0)
MCV: 90.6 fL (ref 78.0–100.0)
RBC: 4.66 MIL/uL (ref 3.87–5.11)

## 2011-07-27 LAB — RAPID URINE DRUG SCREEN, HOSP PERFORMED: Barbiturates: NOT DETECTED

## 2011-07-27 LAB — PREGNANCY, URINE: Preg Test, Ur: NEGATIVE

## 2011-07-29 LAB — URINE CULTURE: Culture  Setup Time: 201210192025

## 2011-08-28 ENCOUNTER — Encounter: Payer: Self-pay | Admitting: Anesthesiology

## 2011-09-08 ENCOUNTER — Encounter: Payer: Self-pay | Admitting: Anesthesiology

## 2011-11-11 ENCOUNTER — Encounter (HOSPITAL_COMMUNITY): Payer: Self-pay | Admitting: Emergency Medicine

## 2011-11-11 ENCOUNTER — Emergency Department (HOSPITAL_COMMUNITY)
Admission: EM | Admit: 2011-11-11 | Discharge: 2011-11-11 | Disposition: A | Discharge status: Home / Self Care | Payer: Medicaid Other | Attending: Emergency Medicine | Admitting: Emergency Medicine

## 2011-11-11 DIAGNOSIS — G40909 Epilepsy, unspecified, not intractable, without status epilepticus: Secondary | ICD-10-CM | POA: Insufficient documentation

## 2011-11-11 DIAGNOSIS — M549 Dorsalgia, unspecified: Secondary | ICD-10-CM

## 2011-11-11 DIAGNOSIS — Z79899 Other long term (current) drug therapy: Secondary | ICD-10-CM | POA: Insufficient documentation

## 2011-11-11 DIAGNOSIS — M545 Low back pain, unspecified: Secondary | ICD-10-CM | POA: Insufficient documentation

## 2011-11-11 DIAGNOSIS — G8929 Other chronic pain: Secondary | ICD-10-CM | POA: Insufficient documentation

## 2011-11-11 DIAGNOSIS — M546 Pain in thoracic spine: Secondary | ICD-10-CM | POA: Insufficient documentation

## 2011-11-11 HISTORY — DX: Dorsalgia, unspecified: M54.9

## 2011-11-11 HISTORY — DX: Other chronic pain: G89.29

## 2011-11-11 HISTORY — DX: Unspecified convulsions: R56.9

## 2011-11-11 MED ORDER — HYDROMORPHONE HCL PF 2 MG/ML IJ SOLN
2.0000 mg | Freq: Once | INTRAMUSCULAR | Status: AC
  Administered 2011-11-11: 2 mg via INTRAMUSCULAR
  Filled 2011-11-11: qty 1

## 2011-11-11 NOTE — ED Provider Notes (Signed)
History     CSN: [id number]  Arrival date & time 11/11/11  1550   First MD Initiated Contact with Patient 11/11/11 1701      Chief Complaint  Patient presents with  . Back Pain     Patient is a 41 y.o. female presenting with back pain. The history is provided by the patient.  Back Pain  This is a chronic problem. Episode onset: years ago. The problem occurs constantly. The problem has been gradually worsening. The pain is associated with no known injury. The pain is present in the thoracic spine and lumbar spine. The pain is severe. The symptoms are aggravated by twisting and bending. The pain is the same all the time. Pertinent negatives include no chest pain, no fever, no numbness, no abdominal pain, no bowel incontinence, no bladder incontinence, no dysuria, no paresthesias, no paresis and no weakness.  denies h/o cancer No falls/trauma No fever She is able to ambulate No h/o back surgery No h/o neck surgery  Reports she seen chronic pain specialist for back pain but now her meds are not working No new injury Denies fecal/urinary incontinence Reports never seen by spine specialist She is requesting MRI  Past Medical History  Diagnosis Date  . Back pain, chronic   . Seizures     pt had 1 seizure October 2012- no other history of seizures    Past Surgical History  Procedure Date  . Abdominal hysterectomy     No family history on file.  History  Substance Use Topics  . Smoking status: Current Everyday Smoker  . Smokeless tobacco: Not on file  . Alcohol Use: Yes    OB History    Grav Para Term Preterm Abortions TAB SAB Ect Mult Living                  Review of Systems  Constitutional: Negative for fever.  Cardiovascular: Negative for chest pain.  Gastrointestinal: Negative for abdominal pain and bowel incontinence.  Genitourinary: Negative for bladder incontinence and dysuria.  Musculoskeletal: Positive for back pain.  Neurological: Negative for weakness,  numbness and paresthesias.  All other systems reviewed and are negative.    Allergies  Clonidine derivatives and Sulfa drugs cross reactors  Home Medications   Current Outpatient Rx  Name Route Sig Dispense Refill  . ALPRAZOLAM 1 MG PO TABS Oral Take 1 mg by mouth 4 (four) times daily.    . BC HEADACHE POWDER PO Oral Take 1 Package by mouth every 4 (four) hours as needed. For pain    . IBUPROFEN 800 MG PO TABS Oral Take 800 mg by mouth every 8 (eight) hours.    . OXYCODONE HCL 15 MG PO TABS Oral Take 15 mg by mouth 4 (four) times daily.    Marland Kitchen PROGESTERONE MICRONIZED 100 MG PO CAPS Oral Take 100 mg by mouth daily.      BP 108/42  Pulse 101  Temp(Src) 98.2 F (36.8 C) (Oral)  Resp 20  SpO2 99%  Physical Exam CONSTITUTIONAL: Well developed/well nourished HEAD AND FACE: Normocephalic/atraumatic EYES: EOMI/PERRL ENMT: Mucous membranes moist NECK: supple no meningeal signs SPINE:cervical spine nontender, thoracic/lumbar spine tender, No bruising/crepitance/stepoffs noted to spine CV: S1/S2 noted, no murmurs/rubs/gallops noted LUNGS: Lungs are clear to auscultation bilaterally, no apparent distress ABDOMEN: soft, nontender, no rebound or guarding GU:no cva tenderness NEURO: Awake/alert, equal distal motor: hip flexion/knee flexion/extension, ankle dorsi/plantar flexion, great toe extension intact bilaterally, no clonus bilaterally, plantar reflex appropriate, no apparent sensory  deficit in any dermatome.  Equal patellar/achilles reflex noted.  Pt is able to ambulate. EXTREMITIES: pulses normal, full ROM SKIN: warm, color normal PSYCH: no abnormalities of mood noted  ED Course  Procedures      1. Back pain     The patient appears reasonably screened and/or stabilized for discharge and I doubt any other medical condition or other [facility] requiring further screening, evaluation, or treatment in the ED at this time prior to discharge.   MDM  Nursing notes reviewed and  considered in documentation Narcotic database reviewed        Joya Gaskins, MD 11/11/11 [phone]

## 2011-11-11 NOTE — ED Notes (Signed)
I gave the patient a large ice pack. 

## 2011-11-11 NOTE — ED Notes (Signed)
Pt has been going to pain clinic since October and has been complying with all the recommendations provided. Since the last two weeks her pain has increased and needs an MRI. sts they have not done an MRI on her since she has been going there.

## 2011-11-11 NOTE — ED Notes (Signed)
C/o chronic lower back pain.  States she was seen at Pain clinic 2 weeks ago and the medicine that has been working for the past couple of months is no longer working. States her pain clinic doctor told her to come to ED if pain got worse.

## 2012-01-01 ENCOUNTER — Other Ambulatory Visit: Payer: Self-pay | Admitting: Neurological Surgery

## 2012-01-01 DIAGNOSIS — M545 Low back pain: Secondary | ICD-10-CM

## 2012-01-08 ENCOUNTER — Ambulatory Visit
Admission: RE | Admit: 2012-01-08 | Discharge: 2012-01-08 | Disposition: A | Discharge status: Home / Self Care | Payer: Medicaid Other | Source: Ambulatory Visit | Attending: Neurological Surgery | Admitting: Neurological Surgery

## 2012-01-08 DIAGNOSIS — M545 Low back pain: Secondary | ICD-10-CM

## 2012-02-07 ENCOUNTER — Encounter (HOSPITAL_COMMUNITY): Payer: Self-pay | Admitting: *Deleted

## 2012-02-07 ENCOUNTER — Emergency Department (HOSPITAL_COMMUNITY)
Admission: EM | Admit: 2012-02-07 | Discharge: 2012-02-08 | Disposition: A | Discharge status: Home / Self Care | Payer: Self-pay | Attending: Emergency Medicine | Admitting: Emergency Medicine

## 2012-02-07 DIAGNOSIS — F1120 Opioid dependence, uncomplicated: Secondary | ICD-10-CM | POA: Insufficient documentation

## 2012-02-07 HISTORY — DX: Other chronic pain: G89.29

## 2012-02-07 HISTORY — DX: Dorsalgia, unspecified: M54.9

## 2012-02-07 LAB — COMPREHENSIVE METABOLIC PANEL
Alkaline Phosphatase: 68 U/L (ref 39–117)
BUN: 8 mg/dL (ref 6–23)
Calcium: 9.3 mg/dL (ref 8.4–10.5)
Creatinine, Ser: 0.63 mg/dL (ref 0.50–1.10)
GFR calc Af Amer: >90 mL/min (ref >90)
Glucose, Bld: 74 mg/dL (ref 70–99)
Potassium: 4.4 mEq/L (ref 3.5–5.1)
Total Protein: 7.1 g/dL (ref 6.0–8.3)

## 2012-02-07 LAB — RAPID URINE DRUG SCREEN, HOSP PERFORMED: Benzodiazepines: POSITIVE — AB

## 2012-02-07 LAB — CBC
HCT: 41.1 % (ref 36.0–46.0)
Hemoglobin: 14 g/dL (ref 12.0–15.0)
MCH: 31.5 pg (ref 26.0–34.0)
MCHC: 34.1 g/dL (ref 30.0–36.0)
MCV: 92.6 fL (ref 78.0–100.0)

## 2012-02-07 LAB — ETHANOL: Alcohol, Ethyl (B): 105 mg/dL — ABNORMAL HIGH (ref 0–11)

## 2012-02-07 NOTE — ED Notes (Signed)
The pt is here for detox from percocet and xanax.  Her mother is at her bedside and says the pt has not had percocet since Monday,  However the pt is very sleepy anf can barely keep her eyes open

## 2012-02-07 NOTE — ED Notes (Signed)
Pt up to the br with assistance 

## 2012-02-07 NOTE — ED Notes (Signed)
Pt states that she needs detox from "high dose of percocet" which she is on due to chronic back pain.  Pt used this last on Tuesday.  Pt appears sedated and states that she takes xanax

## 2012-02-07 NOTE — ED Notes (Signed)
Patient was desatting while asleep, so I started her on 2L O2 to compensate.

## 2012-02-07 NOTE — ED Provider Notes (Signed)
History     CSN: [id number]  Arrival date & time 02/07/12  1813   First MD Initiated Contact with Patient 02/07/12 2107      Chief Complaint  Patient presents with  . Medical Clearance    (Consider location/radiation/quality/duration/timing/severity/associated sxs/prior treatment) Patient is a 41 y.o. female presenting with drug/alcohol assessment. The history is provided by the patient.  Drug / Alcohol Assessment Primary symptoms include somnolence and weakness. This is a chronic problem. Pertinent negatives include no fever.  Pt states she is taking prescribed opiods, benzos, and drinks alcohol. Here for detox. States medications are for chronic back pain, states she takes more than prescribed because not working and wants to get off of them. She denies new complaints with back pain, no recent injuries. Followed by a neurosurgeon and pain management. Denies SI or HI. No other complaints.   Past Medical History  Diagnosis Date  . Back pain, chronic   . Seizures     pt had 1 seizure October 2012- no other history of seizures  . Chronic back pain     Past Surgical History  Procedure Date  . Abdominal hysterectomy     No family history on file.  History  Substance Use Topics  . Smoking status: Current Everyday Smoker  . Smokeless tobacco: Not on file  . Alcohol Use: Yes    OB History    Grav Para Term Preterm Abortions TAB SAB Ect Mult Living                  Review of Systems  Constitutional: Negative for fever and chills.  HENT: Negative.   Eyes: Negative.   Respiratory: Negative.   Cardiovascular: Negative.   Gastrointestinal: Negative.   Musculoskeletal: Positive for back pain.  Skin: Negative.   Neurological: Positive for weakness. Negative for dizziness, tremors and numbness.  Psychiatric/Behavioral: The patient is nervous/anxious.     Allergies  Clonidine derivatives and Sulfa drugs cross reactors  Home Medications   Current Outpatient Rx  Name  Route Sig Dispense Refill  . ALPRAZOLAM 1 MG PO TABS Oral Take 1 mg by mouth 4 (four) times daily.    . BC HEADACHE POWDER PO Oral Take 1 Package by mouth 2 (two) times daily as needed. For pain    . IBUPROFEN 800 MG PO TABS Oral Take 800 mg by mouth every 8 (eight) hours as needed. For pain    . OXYCODONE HCL 15 MG PO TABS Oral Take 15 mg by mouth every 4 (four) hours.    Marland Kitchen PROGESTERONE MICRONIZED 100 MG PO CAPS Oral Take 100 mg by mouth at bedtime.     Marland Kitchen QUETIAPINE FUMARATE 200 MG PO TABS Oral Take 200 mg by mouth at bedtime.      BP 97/64  Pulse 85  Temp(Src) 97.5 F (36.4 C) (Oral)  Resp 12  SpO2 92%  Physical Exam  Nursing note and vitals reviewed. Constitutional: She is oriented to person, place, and time. She appears well-developed and well-nourished.       Very sedated, solmnolent  HENT:  Head: Normocephalic and atraumatic.  Eyes: Conjunctivae and EOM are normal. Pupils are equal, round, and reactive to light.  Neck: Normal range of motion. Neck supple.  Cardiovascular: Normal rate, regular rhythm and normal heart sounds.   Pulmonary/Chest: Effort normal and breath sounds normal. No respiratory distress.  Abdominal: Soft. Bowel sounds are normal. She exhibits no distension.  Musculoskeletal: Normal range of motion.  Neurological: She is alert  and oriented to person, place, and time. She has normal reflexes. No cranial nerve deficit.       Poor coordination, slurring speech  Skin: Skin is warm and dry.  Psychiatric:       Flat affect, appears sedated, intoxicated    ED Course  Procedures (including critical care time)  Labs Reviewed  COMPREHENSIVE METABOLIC PANEL - Abnormal; Notable for the following:    Total Bilirubin 0.2 (*)    All other components within normal limits  ETHANOL - Abnormal; Notable for the following:    Alcohol, Ethyl (B) 105 (*)    All other components within normal limits  URINE RAPID DRUG SCREEN (HOSP PERFORMED) - Abnormal; Notable for the  following:    Benzodiazepines POSITIVE (*)    All other components within normal limits  CBC   Pt wanting detox from alcohol, opiates. She is currently very sedated, but able to keep a conversation. Will call ACT for assessment. Pt denies SI or HI  Spoke with ACT, will assess  1. Polysubstance dependence including opioid type drug, continuous use       MDM        Lottie Mussel, PA 02/13/12 1610

## 2012-02-08 MED ORDER — LORAZEPAM 1 MG PO TABS
1.0000 mg | ORAL_TABLET | Freq: Three times a day (TID) | ORAL | Status: DC | PRN
  Administered 2012-02-08 (×2): 1 mg via ORAL
  Filled 2012-02-08 (×2): qty 1

## 2012-02-08 MED ORDER — CLONIDINE HCL 0.1 MG PO TABS: 0.1000 mg | ORAL_TABLET | Freq: Every day | ORAL | Status: DC

## 2012-02-08 MED ORDER — HYDROXYZINE HCL 25 MG PO TABS
25.0000 mg | ORAL_TABLET | Freq: Four times a day (QID) | ORAL | Status: DC | PRN
  Administered 2012-02-08: 25 mg via ORAL
  Filled 2012-02-08: qty 1

## 2012-02-08 MED ORDER — DICYCLOMINE HCL 20 MG PO TABS: 20.0000 mg | ORAL_TABLET | ORAL | Status: DC | PRN

## 2012-02-08 MED ORDER — NICOTINE 21 MG/24HR TD PT24
21.0000 mg | MEDICATED_PATCH | Freq: Every day | TRANSDERMAL | Status: DC
  Administered 2012-02-08: 21 mg via TRANSDERMAL
  Filled 2012-02-08: qty 1

## 2012-02-08 MED ORDER — IBUPROFEN 200 MG PO TABS
600.0000 mg | ORAL_TABLET | Freq: Three times a day (TID) | ORAL | Status: DC | PRN
  Administered 2012-02-08 (×2): 600 mg via ORAL
  Filled 2012-02-08 (×2): qty 3

## 2012-02-08 MED ORDER — LOPERAMIDE HCL 2 MG PO CAPS
2.0000 mg | ORAL_CAPSULE | ORAL | Status: DC | PRN
Start: 2012-02-08 — End: 2012-02-09

## 2012-02-08 MED ORDER — ONDANSETRON HCL 8 MG PO TABS
4.0000 mg | ORAL_TABLET | Freq: Three times a day (TID) | ORAL | Status: DC | PRN
  Administered 2012-02-08: 4 mg via ORAL
  Filled 2012-02-08: qty 1

## 2012-02-08 MED ORDER — ALBUTEROL SULFATE HFA 108 (90 BASE) MCG/ACT IN AERS
2.0000 | INHALATION_SPRAY | Freq: Once | RESPIRATORY_TRACT | Status: AC
  Administered 2012-02-08: 2 via RESPIRATORY_TRACT
  Filled 2012-02-08: qty 6.7

## 2012-02-08 MED ORDER — ONDANSETRON 4 MG PO TBDP
4.0000 mg | ORAL_TABLET | Freq: Four times a day (QID) | ORAL | Status: DC | PRN
  Administered 2012-02-08: 4 mg via ORAL
  Filled 2012-02-08: qty 1

## 2012-02-08 MED ORDER — NAPROXEN 500 MG PO TABS: 500.0000 mg | ORAL_TABLET | Freq: Two times a day (BID) | ORAL | Status: DC | PRN

## 2012-02-08 MED ORDER — CLONIDINE HCL 0.1 MG PO TABS
0.1000 mg | ORAL_TABLET | Freq: Four times a day (QID) | ORAL | Status: DC
  Administered 2012-02-08: 0.1 mg via ORAL
  Filled 2012-02-08: qty 1

## 2012-02-08 MED ORDER — CLONIDINE HCL 0.1 MG PO TABS: 0.1000 mg | ORAL_TABLET | ORAL | Status: DC

## 2012-02-08 MED ORDER — METHOCARBAMOL 500 MG PO TABS: 500.0000 mg | ORAL_TABLET | Freq: Three times a day (TID) | ORAL | Status: DC | PRN

## 2012-02-08 NOTE — ED Notes (Signed)
Pt denies any pain or questions upon discharge, pt discharge with sister and verbal understanding pt is going [facility].

## 2012-02-08 NOTE — ED Notes (Signed)
Patient's O2 saturation decreased to upper 80s while sleeping on RA. Oxygen applied via [place]. O2 sats increased to the mid 90s. Patient resting comfortably. Will continue to monitor

## 2012-02-08 NOTE — ED Notes (Signed)
Pt reports addiction to prescription pain medication for severe back pain. States she was taking 6-8 15mg  percocets/day PTA. States relapsed in October. Prior to that last use was five years ago when her personal physician was reprimanded by the state medical board for over prescription of narcotics. Pt also reports severe anxiety and depression. States she normally takes xanax several times a day. Pt very drowsy and medicated during conversation. States she feels sweaty. No other withdrawal symptoms expressed at present.

## 2012-02-08 NOTE — ED Notes (Signed)
Patient in blue scrubs. Patient clothing and medications sent home with mother. Pocketbook remains at bedside with patient.

## 2012-02-08 NOTE — BH Assessment (Signed)
Assessment Note   [name] is an 41 y.o. female.  Patient is a 41 year old white female that requests detox from pain pills.  Documentation in her file reports that she has a blood alcohol level of 105.  Patient tested positive for Benzos.  Patient reports that she has a chronic back problem.   Patient reports that she receives her pain medication from her pain management physician.  Patient reports that he is prescribed 6 pills daily but will take more due to the increased pain.  Patient reports that the pills do not stop her nonstop pain.  Patient reports a history of detox in the past at [name], and [facility].  Patient was not able to remember the year in which she was in detox.  Patient reports that she has hospitalized when she was 41 years old for depression after she had her child.  Patient denies SI/HI.  Patient denies psychosis.   Therapist sent referral form to [name] for possible placement for detox.    Axis I: 304.00 Opoid Dependence     311 Depressive Disorder  Axis II: Deferred Axis III:  Past Medical History  Diagnosis Date  . Back pain, chronic   . Seizures     pt had 1 seizure October 2012- no other history of seizures  . Chronic back pain    Axis IV: occupational problems, other psychosocial or environmental problems and problems with access to health care services Axis V: 51-60 moderate symptoms  Past Medical History:  Past Medical History  Diagnosis Date  . Back pain, chronic   . Seizures     pt had 1 seizure October 2012- no other history of seizures  . Chronic back pain     Past Surgical History  Procedure Date  . Abdominal hysterectomy     Family History: No family history on file.  Social History:  reports that she has been smoking.  She does not have any smokeless tobacco history on file. She reports that she drinks alcohol. She reports that she does not use illicit drugs.  Additional Social History:    Allergies:  Allergies  Allergen Reactions  . Clonidine  Derivatives Other (See Comments)    The patch causes seizures  . Sulfa Drugs Cross Reactors Other (See Comments)    unknown    Home Medications:  (Not in a hospital admission)  OB/GYN Status:  No LMP recorded. Patient has had a hysterectomy.  General Assessment Data Location of Assessment: [name] Assessment: Yes Living Arrangements: Children (She has her own home) Can pt return to current living arrangement?: Yes Admission Status: Voluntary Is patient capable of signing voluntary admission?: Yes Transfer from: Home Referral Source: Self/Family/Friend     Risk to self Suicidal Ideation: No Suicidal Intent: No Is patient at risk for suicide?: No Suicidal Plan?: No Access to Means: No What has been your use of drugs/alcohol within the last 12 months?: benzos Previous Attempts/Gestures: No How many times?: 0  Other Self Harm Risks: 0 Triggers for Past Attempts: Unpredictable Intentional Self Injurious Behavior: None Family Suicide History: No Recent stressful life event(s): Trauma (Comment) Persecutory voices/beliefs?: No Depression: Yes Depression Symptoms: Fatigue;Guilt;Feeling worthless/self pity Substance abuse history and/or treatment for substance abuse?: Yes Suicide prevention information given to non-admitted patients: Not applicable  Risk to Others Homicidal Ideation: No Thoughts of Harm to Others: No Current Homicidal Intent: No Current Homicidal Plan: No Access to Homicidal Means: No Identified Victim: na History of harm to others?:  No Assessment of Violence: None Noted Violent Behavior Description: calm Does patient have access to weapons?: No Criminal Charges Pending?: No Does patient have a court date: No  Psychosis Hallucinations: None noted Delusions: None noted  Mental Status Report Appear/Hygiene: Disheveled Eye Contact: Fair Motor Activity: Restlessness;Unsteady Speech: Soft;Slow;Slurred Level of Consciousness:  Alert;Quiet/awake Mood: Depressed Affect: Irritable;Sad Anxiety Level: None Thought Processes: Coherent;Relevant Judgement: Unimpaired Orientation: Person;Place;Time;Situation Obsessive Compulsive Thoughts/Behaviors: None  Cognitive Functioning Concentration: Decreased Memory: Recent Impaired;Remote Impaired IQ: Average Insight: Poor Impulse Control: Poor Appetite: Fair Weight Loss: 0  Weight Gain: 0  Sleep: Decreased Total Hours of Sleep: 4  Vegetative Symptoms: None  Prior Inpatient Therapy Prior Inpatient Therapy: Yes Prior Therapy Dates: unknown  [name] Reason for Treatment: detox  Prior Outpatient Therapy Prior Outpatient Therapy: No Prior Therapy Dates: na Prior Therapy Facilty/Provider(s): na Reason for Treatment: na                     Additional Information 1:1 In Past 12 Months?: No CIRT Risk: No Elopement Risk: No Does patient have medical clearance?: Yes     Disposition: Pending placement at [name] Disposition Type of inpatient treatment program: Adult  On Site Evaluation by:   Reviewed with Physician:     Phillip Heal LaVerne 02/08/2012 1:00 AM

## 2012-02-08 NOTE — ED Provider Notes (Signed)
Patient with no acute medical issues this morning she is pending possible admission at the behavioral health for her substance abuse of pain medications.  Erin Christen, MD 02/08/12 [phone]

## 2012-02-08 NOTE — ED Notes (Signed)
Pt continues to sleep in room. VSS. Pts medication held pending pt waking up with withdrawal symptoms.

## 2012-02-08 NOTE — ED Notes (Signed)
Labs faxed to [facility] per request of Irving Burton, ACT team for patient placement.

## 2012-02-08 NOTE — Discharge Instructions (Signed)
Go to [facility]

## 2012-02-14 NOTE — ED Provider Notes (Signed)
Medical screening examination/treatment/procedure(s) were performed by non-physician practitioner and as supervising physician I was immediately available for consultation/collaboration.    Glynn Octave, MD 02/14/12 [phone]

## 2013-03-04 ENCOUNTER — Emergency Department: Payer: Self-pay | Admitting: Emergency Medicine

## 2013-03-04 LAB — COMPREHENSIVE METABOLIC PANEL
Alkaline Phosphatase: 89 U/L (ref 50–136)
BUN: 9 mg/dL (ref 7–18)
Bilirubin,Total: 0.2 mg/dL (ref 0.2–1.0)
EGFR (African American): >60
EGFR (Non-African Amer.): >60
Glucose: 109 mg/dL — ABNORMAL HIGH (ref 65–99)
Potassium: 3.7 mmol/L (ref 3.5–5.1)
SGOT(AST): 19 U/L (ref 15–37)
SGPT (ALT): 21 U/L (ref 12–78)
Sodium: 138 mmol/L (ref 136–145)
Total Protein: 8 g/dL (ref 6.4–8.2)

## 2013-03-04 LAB — URINALYSIS, COMPLETE
Glucose,UR: NEGATIVE mg/dL (ref 0–75)
Ketone: NEGATIVE
Nitrite: NEGATIVE
Ph: 6 (ref 4.5–8.0)
Protein: NEGATIVE
Squamous Epithelial: 5
WBC UR: 21 /HPF (ref 0–5)

## 2013-03-04 LAB — CBC: MCH: 33.4 pg (ref 26.0–34.0)

## 2013-03-06 ENCOUNTER — Ambulatory Visit: Payer: Self-pay | Admitting: Surgery

## 2013-03-09 LAB — PATHOLOGY REPORT

## 2013-03-24 ENCOUNTER — Encounter (HOSPITAL_COMMUNITY): Payer: Self-pay | Admitting: *Deleted

## 2013-03-24 ENCOUNTER — Encounter (HOSPITAL_COMMUNITY): Payer: Self-pay

## 2013-03-24 ENCOUNTER — Inpatient Hospital Stay (HOSPITAL_COMMUNITY)
Admission: RE | Admit: 2013-03-24 | Discharge: 2013-03-30 | DRG: 885 | Disposition: A | Discharge status: Home / Self Care | Payer: Medicaid Other | Attending: Psychiatry | Admitting: Psychiatry

## 2013-03-24 ENCOUNTER — Emergency Department (HOSPITAL_COMMUNITY)
Admission: EM | Admit: 2013-03-24 | Discharge: 2013-03-24 | Disposition: A | Discharge status: Other Acute Inpatient Hospital | Payer: Medicaid Other | Source: Home / Self Care | Attending: Emergency Medicine | Admitting: Emergency Medicine

## 2013-03-24 DIAGNOSIS — F329 Major depressive disorder, single episode, unspecified: Secondary | ICD-10-CM

## 2013-03-24 DIAGNOSIS — F11220 Opioid dependence with intoxication, uncomplicated: Secondary | ICD-10-CM

## 2013-03-24 DIAGNOSIS — Z3202 Encounter for pregnancy test, result negative: Secondary | ICD-10-CM | POA: Insufficient documentation

## 2013-03-24 DIAGNOSIS — R45851 Suicidal ideations: Secondary | ICD-10-CM | POA: Insufficient documentation

## 2013-03-24 DIAGNOSIS — G40909 Epilepsy, unspecified, not intractable, without status epilepticus: Secondary | ICD-10-CM | POA: Insufficient documentation

## 2013-03-24 DIAGNOSIS — F314 Bipolar disorder, current episode depressed, severe, without psychotic features: Secondary | ICD-10-CM

## 2013-03-24 DIAGNOSIS — G8929 Other chronic pain: Secondary | ICD-10-CM | POA: Insufficient documentation

## 2013-03-24 DIAGNOSIS — R450 Nervousness: Secondary | ICD-10-CM | POA: Insufficient documentation

## 2013-03-24 DIAGNOSIS — F1110 Opioid abuse, uncomplicated: Secondary | ICD-10-CM

## 2013-03-24 DIAGNOSIS — Z79899 Other long term (current) drug therapy: Secondary | ICD-10-CM

## 2013-03-24 DIAGNOSIS — F1910 Other psychoactive substance abuse, uncomplicated: Secondary | ICD-10-CM | POA: Diagnosis present

## 2013-03-24 DIAGNOSIS — F1120 Opioid dependence, uncomplicated: Secondary | ICD-10-CM | POA: Insufficient documentation

## 2013-03-24 DIAGNOSIS — M549 Dorsalgia, unspecified: Secondary | ICD-10-CM | POA: Diagnosis present

## 2013-03-24 DIAGNOSIS — F32A Depression, unspecified: Secondary | ICD-10-CM

## 2013-03-24 DIAGNOSIS — F319 Bipolar disorder, unspecified: Secondary | ICD-10-CM | POA: Insufficient documentation

## 2013-03-24 DIAGNOSIS — F411 Generalized anxiety disorder: Secondary | ICD-10-CM | POA: Insufficient documentation

## 2013-03-24 DIAGNOSIS — F3160 Bipolar disorder, current episode mixed, unspecified: Principal | ICD-10-CM | POA: Diagnosis present

## 2013-03-24 HISTORY — DX: Depression, unspecified: F32.A

## 2013-03-24 HISTORY — DX: Major depressive disorder, single episode, unspecified: F32.9

## 2013-03-24 LAB — COMPREHENSIVE METABOLIC PANEL
AST: 12 U/L (ref 0–37)
Albumin: 3.9 g/dL (ref 3.5–5.2)
Alkaline Phosphatase: 106 U/L (ref 39–117)
BUN: 4 mg/dL — ABNORMAL LOW (ref 6–23)
CO2: 27 mEq/L (ref 19–32)
Chloride: 106 mEq/L (ref 96–112)
GFR calc non Af Amer: >90 mL/min (ref >90)
Potassium: 3.7 mEq/L (ref 3.5–5.1)
Total Bilirubin: 0.1 mg/dL — ABNORMAL LOW (ref 0.3–1.2)

## 2013-03-24 LAB — CBC
HCT: 38.6 % (ref 36.0–46.0)
Platelets: 273 10*3/uL (ref 150–400)
RBC: 4.15 MIL/uL (ref 3.87–5.11)
RDW: 12.6 % (ref 11.5–15.5)
WBC: 9.2 10*3/uL (ref 4.0–10.5)

## 2013-03-24 LAB — RAPID URINE DRUG SCREEN, HOSP PERFORMED
Amphetamines: NOT DETECTED
Barbiturates: POSITIVE — AB
Benzodiazepines: POSITIVE — AB

## 2013-03-24 LAB — ACETAMINOPHEN LEVEL: Acetaminophen (Tylenol), Serum: <15 ug/mL (ref 10–30)

## 2013-03-24 MED ORDER — ACETAMINOPHEN 325 MG PO TABS: 650.0000 mg | ORAL_TABLET | ORAL | Status: DC | PRN

## 2013-03-24 MED ORDER — NICOTINE 21 MG/24HR TD PT24
21.0000 mg | MEDICATED_PATCH | Freq: Every day | TRANSDERMAL | Status: DC
  Administered 2013-03-24: 21 mg via TRANSDERMAL
  Filled 2013-03-24: qty 1

## 2013-03-24 MED ORDER — DIAZEPAM 5 MG PO TABS
10.0000 mg | ORAL_TABLET | Freq: Three times a day (TID) | ORAL | Status: DC | PRN
  Administered 2013-03-24: 10 mg via ORAL
  Filled 2013-03-24: qty 2

## 2013-03-24 MED ORDER — TEMAZEPAM 30 MG PO CAPS
30.0000 mg | ORAL_CAPSULE | Freq: Every day | ORAL | Status: DC
  Administered 2013-03-24: 30 mg via ORAL
  Filled 2013-03-24: qty 1

## 2013-03-24 MED ORDER — IBUPROFEN 600 MG PO TABS: 600.0000 mg | ORAL_TABLET | Freq: Three times a day (TID) | ORAL | Status: DC | PRN

## 2013-03-24 MED ORDER — PROGESTERONE MICRONIZED 200 MG PO CAPS
200.0000 mg | ORAL_CAPSULE | Freq: Every day | ORAL | Status: DC
  Administered 2013-03-24: 200 mg via ORAL
  Filled 2013-03-24 (×2): qty 1

## 2013-03-24 MED ORDER — VORTIOXETINE HBR 20 MG PO TABS: 1.0000 | ORAL_TABLET | Freq: Every day | ORAL | Status: DC

## 2013-03-24 MED ORDER — TRAMADOL HCL 50 MG PO TABS: 50.0000 mg | ORAL_TABLET | Freq: Three times a day (TID) | ORAL | Status: DC | PRN

## 2013-03-24 MED ORDER — ZOLPIDEM TARTRATE 5 MG PO TABS: 5.0000 mg | ORAL_TABLET | Freq: Every evening | ORAL | Status: DC | PRN

## 2013-03-24 MED ORDER — ALUM & MAG HYDROXIDE-SIMETH 200-200-20 MG/5ML PO SUSP: 30.0000 mL | ORAL | Status: DC | PRN

## 2013-03-24 MED ORDER — LORAZEPAM 1 MG PO TABS
1.0000 mg | ORAL_TABLET | Freq: Three times a day (TID) | ORAL | Status: DC | PRN
  Administered 2013-03-24: 1 mg via ORAL
  Filled 2013-03-24: qty 1

## 2013-03-24 MED ORDER — CYANOCOBALAMIN 1000 MCG/ML IJ SOLN: 1000.0000 ug | INTRAMUSCULAR | Status: DC

## 2013-03-24 MED ORDER — BUTALBITAL-APAP-CAFFEINE 50-325-40 MG PO TABS: 2.0000 | ORAL_TABLET | Freq: Two times a day (BID) | ORAL | Status: DC | PRN

## 2013-03-24 MED ORDER — ONDANSETRON HCL 4 MG PO TABS: 4.0000 mg | ORAL_TABLET | Freq: Three times a day (TID) | ORAL | Status: DC | PRN

## 2013-03-24 MED ORDER — GABAPENTIN 300 MG PO CAPS
600.0000 mg | ORAL_CAPSULE | Freq: Three times a day (TID) | ORAL | Status: DC
  Administered 2013-03-24 (×2): 600 mg via ORAL
  Filled 2013-03-24 (×2): qty 2

## 2013-03-24 NOTE — ED Provider Notes (Signed)
  Medical screening examination/treatment/procedure(s) were performed by non-physician practitioner and as supervising physician I was immediately available for consultation/collaboration.    Fitzgerald Dunne, MD 03/24/13 2342 

## 2013-03-24 NOTE — ED Notes (Signed)
Assessment completed; crying; medicated

## 2013-03-24 NOTE — ED Notes (Signed)
Pt was sent here from [facility] for medical clearance d/t severe depression and SI with no plan.  Pt is calm and cooperative at this time.  Pt reports back and head pain at this time.
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
## 2013-03-24 NOTE — ED Provider Notes (Signed)
History  This chart was scribed for non-physician practitioner working with Gerhard Munch, MD by Greggory Stallion, ED scribe. This patient was seen in room WTR2/WLPT2 and the patient's care was started at 4:32 PM.  CSN: [id number]  Arrival date & time 03/24/13  1611    No chief complaint on file.   The history is provided by the patient. No language interpreter was used.    HPI Comments: Erin [name] is a 42 y.o. female with h/o depression and bipolar disorder who presents to the Emergency Department complaining of gradual onset, gradually worsening anxiety and depression. Pt states she was sent here from [facility] for medical clearance. Pt states her depression has gotten worse over the last two weeks since she got her gallbladder taken out. Pt states that her depression gets bad like this every couple of years. She states she has self medicated in the past but not today or yesterday. Pt states she took a methadone pill yesterday from a friend. She states she has chronic back pain. She states she has an opiate addiction. Pt states she took pills in 2006 to try to commit suicide but she states she doesn't want to do that so that's why she wants help. She denies SI, HI, hallucinations, CP, and SOB as associated symptoms. Pt states that are quite a few people in her family that have committed suicide. Pt states she smokes 1 pack of cigarettes a day.   Past Medical History  Diagnosis Date  . Back pain, chronic   . Seizures     pt had 1 seizure October 2012- no other history of seizures  . Chronic back pain     Past Surgical History  Procedure Laterality Date  . Abdominal hysterectomy      No family history on file.  History  Substance Use Topics  . Smoking status: Current Every Day Smoker  . Smokeless tobacco: Not on file  . Alcohol Use: Yes    OB History   Grav Para Term Preterm Abortions TAB SAB Ect Mult Living                  Review of Systems   Respiratory: Negative for shortness of breath.   Cardiovascular: Negative for chest pain.  Psychiatric/Behavioral: Negative for suicidal ideas and hallucinations. The patient is nervous/anxious.   All other systems reviewed and are negative.    Allergies  Clonidine derivatives and Sulfa drugs cross reactors  Home Medications   Current Outpatient Rx  Name  Route  Sig  Dispense  Refill  . albuterol (PROVENTIL HFA;VENTOLIN HFA) 108 (90 BASE) MCG/ACT inhaler   Inhalation   Inhale 2 puffs into the lungs every 6 (six) hours as needed for wheezing or shortness of breath.         . butalbital-acetaminophen-caffeine (FIORICET, ESGIC) 50-325-40 MG per tablet   Oral   Take 2 tablets by mouth 2 (two) times daily as needed for pain or headache.         . cyanocobalamin (,VITAMIN B-12,) 1000 MCG/ML injection   Intramuscular   Inject 1,000 mcg into the muscle every 30 (thirty) days.         . cyclobenzaprine (FLEXERIL) 10 MG tablet   Oral   Take 10 mg by mouth 3 (three) times daily as needed for muscle spasms.         . diazepam (VALIUM) 10 MG tablet   Oral   Take 10 mg by mouth every 8 (eight)  hours as needed for anxiety.         . gabapentin (NEURONTIN) 600 MG tablet   Oral   Take 600 mg by mouth 3 (three) times daily.         Marland Kitchen ibuprofen (ADVIL,MOTRIN) 600 MG tablet   Oral   Take 600 mg by mouth every 8 (eight) hours as needed for pain.         . progesterone (PROMETRIUM) 200 MG capsule   Oral   Take 200 mg by mouth at bedtime.         . temazepam (RESTORIL) 15 MG capsule   Oral   Take 30 mg by mouth at bedtime.         . traMADol (ULTRAM) 50 MG tablet   Oral   Take 50 mg by mouth every 8 (eight) hours as needed for pain.         . Vortioxetine HBr (BRINTELLIX) 20 MG TABS   Oral   Take 1 tablet by mouth daily.           BP 107/79  Pulse 62  Temp(Src) 98.9 F (37.2 C) (Oral)  Resp 16  Ht 5\' 9"  (1.753 m)  Wt 148 lb (67.132 kg)  BMI 21.85  kg/m2  SpO2 100%  Physical Exam  Nursing note and vitals reviewed. Constitutional: She is oriented to person, place, and time. She appears well-developed and well-nourished.  HENT:  Head: Normocephalic and atraumatic.  Eyes: Pupils are equal, round, and reactive to light.  Neck: Normal range of motion. Neck supple.  Cardiovascular: Normal rate.   Pulmonary/Chest: Effort normal and breath sounds normal.  Abdominal: Soft. There is no tenderness.  Musculoskeletal: Normal range of motion.  Neurological: She is alert and oriented to person, place, and time. GCS eye subscore is 4. GCS verbal subscore is 5. GCS motor subscore is 6.  Skin: Skin is warm and dry. No rash noted.  Psychiatric: She has a normal mood and affect. Her speech is normal and behavior is normal. Thought content normal.  Anxious.     ED Course  Procedures (including critical care time)  DIAGNOSTIC STUDIES: Oxygen Saturation is 100% on RA, normal by my interpretation.    COORDINATION OF CARE: 4:41 PM-Discussed treatment plan which includes CBC, CMP, ethanol, acetaminophen level, and urine rapid drug screen with pt at bedside and pt agreed to plan.   5:02 PM Pt currently waiting for a bed at [facility].  Med rec and psych hold orders filled.  Pt currently stable, medically cleared.  Care discussed with ACT.    Labs Reviewed  COMPREHENSIVE METABOLIC PANEL - Abnormal; Notable for the following:    BUN 4 (*)    Total Bilirubin 0.1 (*)    All other components within normal limits  SALICYLATE LEVEL - Abnormal; Notable for the following:    Salicylate Lvl <2.0 (*)    All other components within normal limits  URINE RAPID DRUG SCREEN (HOSP PERFORMED) - Abnormal; Notable for the following:    Benzodiazepines POSITIVE (*)    Barbiturates POSITIVE (*)    All other components within normal limits  ACETAMINOPHEN LEVEL  CBC  ETHANOL  POCT PREGNANCY, URINE   No results found.   1. Depression   2.  Opiate abuse, continuous       MDM  BP 127/81  Pulse 61  Temp(Src) 97.5 F (36.4 C) (Oral)  Resp 16  Ht 5\' 9"  (1.753 m)  Wt 148 lb (67.132  kg)  BMI 21.85 kg/m2  SpO2 98%  I personally performed the services described in this documentation, which was scribed in my presence. The recorded information has been reviewed and is accurate.     Fayrene Helper, PA-C 03/24/13 2027

## 2013-03-25 MED ORDER — BUTALBITAL-APAP-CAFFEINE 50-325-40 MG PO TABS
2.0000 | ORAL_TABLET | Freq: Two times a day (BID) | ORAL | Status: DC | PRN
  Administered 2013-03-26 – 2013-03-27 (×2): 2 via ORAL
  Filled 2013-03-25 (×2): qty 2

## 2013-03-25 MED ORDER — PROGESTERONE MICRONIZED 200 MG PO CAPS
200.0000 mg | ORAL_CAPSULE | Freq: Every day | ORAL | Status: DC
  Filled 2013-03-25: qty 1

## 2013-03-25 MED ORDER — TRAMADOL HCL 50 MG PO TABS
50.0000 mg | ORAL_TABLET | Freq: Four times a day (QID) | ORAL | Status: DC | PRN
  Administered 2013-03-25 – 2013-03-26 (×5): 50 mg via ORAL
  Filled 2013-03-25 (×5): qty 1

## 2013-03-25 MED ORDER — TRAZODONE HCL 50 MG PO TABS
50.0000 mg | ORAL_TABLET | Freq: Every evening | ORAL | Status: DC | PRN
  Filled 2013-03-25: qty 1

## 2013-03-25 MED ORDER — CYCLOBENZAPRINE HCL 10 MG PO TABS
10.0000 mg | ORAL_TABLET | Freq: Three times a day (TID) | ORAL | Status: DC | PRN
  Administered 2013-03-25 – 2013-03-30 (×9): 10 mg via ORAL
  Filled 2013-03-25 (×9): qty 1

## 2013-03-25 MED ORDER — IBUPROFEN 600 MG PO TABS
600.0000 mg | ORAL_TABLET | Freq: Three times a day (TID) | ORAL | Status: DC | PRN
  Administered 2013-03-25 – 2013-03-30 (×5): 600 mg via ORAL
  Filled 2013-03-25 (×5): qty 1

## 2013-03-25 MED ORDER — HYDROXYZINE HCL 50 MG PO TABS
50.0000 mg | ORAL_TABLET | Freq: Four times a day (QID) | ORAL | Status: DC | PRN
  Administered 2013-03-25 – 2013-03-29 (×9): 50 mg via ORAL
  Filled 2013-03-25 (×2): qty 1

## 2013-03-25 MED ORDER — MAGNESIUM HYDROXIDE 400 MG/5ML PO SUSP: 30.0000 mL | Freq: Every day | ORAL | Status: DC | PRN

## 2013-03-25 MED ORDER — VORTIOXETINE HBR 20 MG PO TABS
20.0000 mg | ORAL_TABLET | Freq: Every day | ORAL | Status: DC
  Administered 2013-03-25: 20 mg via ORAL
  Filled 2013-03-25: qty 1

## 2013-03-25 MED ORDER — LURASIDONE HCL 40 MG PO TABS
40.0000 mg | ORAL_TABLET | Freq: Every day | ORAL | Status: DC
  Administered 2013-03-25 – 2013-03-30 (×6): 40 mg via ORAL
  Filled 2013-03-25: qty 14
  Filled 2013-03-25 (×4): qty 1
  Filled 2013-03-25: qty 14
  Filled 2013-03-25 (×3): qty 1

## 2013-03-25 MED ORDER — TRAZODONE HCL 50 MG PO TABS
50.0000 mg | ORAL_TABLET | Freq: Every evening | ORAL | Status: DC | PRN
  Administered 2013-03-25 (×3): 50 mg via ORAL
  Filled 2013-03-25 (×7): qty 1

## 2013-03-25 MED ORDER — ACETAMINOPHEN 325 MG PO TABS
650.0000 mg | ORAL_TABLET | Freq: Four times a day (QID) | ORAL | Status: DC | PRN
  Administered 2013-03-28: 650 mg via ORAL

## 2013-03-25 MED ORDER — PROGESTERONE MICRONIZED 200 MG PO CAPS
200.0000 mg | ORAL_CAPSULE | Freq: Every day | ORAL | Status: DC
  Administered 2013-03-25 – 2013-03-26 (×2): 200 mg via ORAL
  Filled 2013-03-25 (×4): qty 1

## 2013-03-25 MED ORDER — ALUM & MAG HYDROXIDE-SIMETH 200-200-20 MG/5ML PO SUSP: 30.0000 mL | ORAL | Status: DC | PRN

## 2013-03-25 MED ORDER — NICOTINE 21 MG/24HR TD PT24
21.0000 mg | MEDICATED_PATCH | Freq: Every day | TRANSDERMAL | Status: DC
  Administered 2013-03-25 – 2013-03-30 (×6): 21 mg via TRANSDERMAL
  Filled 2013-03-25 (×9): qty 1

## 2013-03-25 MED ORDER — ALBUTEROL SULFATE HFA 108 (90 BASE) MCG/ACT IN AERS: 2.0000 | INHALATION_SPRAY | Freq: Four times a day (QID) | RESPIRATORY_TRACT | Status: DC | PRN

## 2013-03-25 MED ORDER — DIAZEPAM 5 MG PO TABS
10.0000 mg | ORAL_TABLET | Freq: Two times a day (BID) | ORAL | Status: DC | PRN
  Administered 2013-03-25 – 2013-03-27 (×3): 10 mg via ORAL
  Filled 2013-03-25 (×4): qty 2

## 2013-03-25 MED ORDER — GABAPENTIN 300 MG PO CAPS
600.0000 mg | ORAL_CAPSULE | Freq: Three times a day (TID) | ORAL | Status: DC
  Administered 2013-03-25 – 2013-03-30 (×17): 600 mg via ORAL
  Filled 2013-03-25 (×2): qty 2
  Filled 2013-03-25: qty 42
  Filled 2013-03-25 (×2): qty 2
  Filled 2013-03-25 (×2): qty 42
  Filled 2013-03-25 (×9): qty 2
  Filled 2013-03-25: qty 42
  Filled 2013-03-25 (×4): qty 2
  Filled 2013-03-25: qty 42
  Filled 2013-03-25: qty 2
  Filled 2013-03-25: qty 42
  Filled 2013-03-25: qty 2

## 2013-03-25 NOTE — H&P (Signed)
Psychiatric Admission Assessment Adult  Patient Identification:  Erin [name] Date of Evaluation:  03/25/2013 Chief Complaint:  MOOD DISORDER History of Present Illness:: Erin [name] is admitted emergently and voluntarily at [facility] for  bipolar disorder with a mix of symptoms and polysubstance abuse with suicidal ideation. Patient has a history of multiple suicide attempts with overdose on her medications and also has a family history of completed suicide attempts in her father and uncles. Her previous suicide attempt was in 2008 which was triggered by an argument with her boyfriend Patient Has been medically cleared at [facility] long emergency department. Her urine drug screen positive for barbiturates and benzodiazepines. Patient was diagnosed with bipolar disorder in 2006.  Patient Stated That she called her mother requesting help because of becoming emotionally drained, crying, sad, unhappy,hopeless, fatigue, guilt, tearful, isolating, loss of interest in usual pleasures, feeling worthless, self pity, angry, irritable and insomnia and has thoughts of ending her life x one week, "Patient stated she does not want to be here any longer". Patient complaining about feeling anxiety and need of medication management. Patient is accompanied by her mother and sister to the hospital. Patient has minimal withdrawal symptoms to include anxiety, tingling, agitation, tachycardia, sweats. Patient Lives  with her 68 yo daughter and her boyfriend x14 years.  Patient underwent laparoscopic gallbladder surgery 2 weeks ago since then taking Percocets. Patient reported he has history of polysubstance abuse,  alcohol abuse onset 42 yo (last use 6/13), cannabis  abuse onset 42 yo (last use 4/14), cocaine  abuse onset 42 yo (last use 6/13), opiates onset unsure (last use oxycodone 6/16 & Methadone last use 6/16),  smoking  cigarettes 1 1/2 pk daily. Patient has history of one episode of seizure in her life on 07/27/11.  Reportedly she  had a bruise relationship with her boyfriend when she was a teenager. Reportedly patient was admitted to the [facility] for Bi-Polar and Depression x 3 and her last admission was in 2010.  patient had  outpatient treatment from Dr. Adron Bene  In [place] x 8 years, [place] "until my insurance said I couldn't see him anymore". Patient stresses are unable to drive motor vehicle because of lost license 4 years ago secondary to 2 DWI's. She or her boyfriend gives her self "B-12 shots .     Elements:  Location:  [facility] adult . Quality:  All aspects of her life. Severity:  Does not want to live any longer. Timing:  Recent gallbladder surgery. Duration:  One week. Context:  Medication is not helping and unable to function. Associated Signs/Synptoms: Depression Symptoms:  depressed mood, anhedonia, insomnia, psychomotor agitation, fatigue, feelings of worthlessness/guilt, difficulty concentrating, hopelessness, impaired memory, suicidal thoughts without plan, panic attacks, weight loss, decreased appetite, (Hypo) Manic Symptoms:  Flight of Ideas, Impulsivity, Irritable Mood, Labiality of Mood, Anxiety Symptoms:  Excessive Worry, Panic Symptoms, Psychotic Symptoms:  Denied PTSD Symptoms: Negative  Psychiatric Specialty Exam: Physical Exam  ROS  Blood pressure 112/82, pulse 120, temperature 98.5 F (36.9 C), temperature source Oral, resp. rate 18, height 5\' 9"  (1.753 m), weight 66.679 kg (147 lb).Body mass index is 21.7 kg/(m^2).  General Appearance: Disheveled and Guarded  Eye Solicitor::  Fair  Speech:  Clear and Coherent and Slow  Volume:  Decreased  Mood:  Angry, Anxious, Depressed, Dysphoric, Hopeless, Irritable and Worthless  Affect:  Congruent and Depressed  Thought Process:  Coherent, Goal Directed, Linear and Logical  Orientation:  Full (Time, Place, and Person)  Thought Content:  Rumination  Suicidal Thoughts:  Yes.  without intent/plan  Homicidal Thoughts:  No  Memory:  Immediate;    Good Recent;   Fair  Judgement:  Fair  Insight:  Lacking  Psychomotor Activity:  Decreased, Psychomotor Retardation and Restlessness  Concentration:  Fair  Recall:  Fair  Akathisia:  Negative  Handed:  Right  AIMS (if indicated):     Assets:  Communication Skills Desire for Improvement Housing Resilience Social Support Transportation  Sleep:  Number of Hours: 3.75    Past Psychiatric History: Diagnosis:  Hospitalizations:  Outpatient Care:  Substance Abuse Care:  Self-Mutilation:  Suicidal Attempts:  Violent Behaviors:   Past Medical History:   Past Medical History  Diagnosis Date  . Back pain, chronic   . Seizures     pt had 1 seizure October 2012- no other history of seizures  . Chronic back pain   . Depression    Seizure History:  One episode in 2012 Allergies:   Allergies  Allergen Reactions  . Clonidine Derivatives Other (See Comments)    The patch causes seizures  . Sulfa Drugs Cross Reactors Other (See Comments)    unknown   PTA Medications: Prescriptions prior to admission  Medication Sig Dispense Refill  . albuterol (PROVENTIL HFA;VENTOLIN HFA) 108 (90 BASE) MCG/ACT inhaler Inhale 2 puffs into the lungs every 6 (six) hours as needed for wheezing or shortness of breath.      . butalbital-acetaminophen-caffeine (FIORICET, ESGIC) 50-325-40 MG per tablet Take 2 tablets by mouth 2 (two) times daily as needed for pain or headache.      . cyanocobalamin (,VITAMIN B-12,) 1000 MCG/ML injection Inject 1,000 mcg into the muscle every 30 (thirty) days.      . cyclobenzaprine (FLEXERIL) 10 MG tablet Take 10 mg by mouth 3 (three) times daily as needed for muscle spasms.      . diazepam (VALIUM) 10 MG tablet Take 10 mg by mouth every 8 (eight) hours as needed for anxiety.      . gabapentin (NEURONTIN) 600 MG tablet Take 600 mg by mouth 3 (three) times daily.      Marland Kitchen ibuprofen (ADVIL,MOTRIN) 600 MG tablet Take 600 mg by mouth every 8 (eight) hours as needed for pain.       . progesterone (PROMETRIUM) 200 MG capsule Take 200 mg by mouth at bedtime.      . temazepam (RESTORIL) 15 MG capsule Take 30 mg by mouth at bedtime.      . traMADol (ULTRAM) 50 MG tablet Take 50 mg by mouth every 8 (eight) hours as needed for pain.      . Vortioxetine HBr (BRINTELLIX) 20 MG TABS Take 1 tablet by mouth daily.        Previous Psychotropic Medications:  Medication/Dose                 Substance Abuse History in the last 12 months:  yes  Consequences of Substance Abuse: Legal Consequences:  2 DUIs Blackouts:   Withdrawal Symptoms:   Diaphoresis Tremors  Social History:  reports that she has been smoking Cigarettes.  She has been smoking about 1.00 pack per day. She does not have any smokeless tobacco history on file. She reports that  drinks alcohol. She reports that she does not use illicit drugs. Additional Social History: Pain Medications: pt confirms oxycodone and methadone Negative Consequences of Use: Financial;Legal;Personal relationships;Work / [employer]  Current Place of Residence:   Place of Birth:   Family Members: Marital Status:  She lives with her boyfriend x 13-14 years Children:  Sons:  Daughters: Relationships: Education:  HS Print production planner Problems/Performance: Religious Beliefs/Practices: History of Abuse (Emotional/Phsycial/Sexual) Teacher, music History:  None. Legal History: Hobbies/Interests:  Family History:  History reviewed. No pertinent family history.  Results for orders placed during the hospital encounter of 03/24/13 (from the past 72 hour(s))  ACETAMINOPHEN LEVEL     Status: None   Collection Time    03/24/13  4:46 PM      Result Value Range   Acetaminophen (Tylenol), Serum <15.0  10 - 30 ug/mL   Comment:            THERAPEUTIC CONCENTRATIONS VARY     SIGNIFICANTLY. A RANGE OF 10-30     ug/mL MAY BE AN EFFECTIVE     CONCENTRATION FOR MANY PATIENTS.      HOWEVER, SOME ARE BEST TREATED     AT CONCENTRATIONS OUTSIDE THIS     RANGE.     ACETAMINOPHEN CONCENTRATIONS     >150 ug/mL AT 4 HOURS AFTER     INGESTION AND >50 ug/mL AT 12     HOURS AFTER INGESTION ARE     OFTEN ASSOCIATED WITH TOXIC     REACTIONS.  CBC     Status: None   Collection Time    03/24/13  4:46 PM      Result Value Range   WBC 9.2  4.0 - 10.5 K/uL   RBC 4.15  3.87 - 5.11 MIL/uL   Hemoglobin 13.6  12.0 - 15.0 g/dL   HCT 16.1  09.6 - 04.5 %   MCV 93.0  78.0 - 100.0 fL   MCH 32.8  26.0 - 34.0 pg   MCHC 35.2  30.0 - 36.0 g/dL   RDW 40.9  81.1 - 91.4 %   Platelets 273  150 - 400 K/uL  COMPREHENSIVE METABOLIC PANEL     Status: Abnormal   Collection Time    03/24/13  4:46 PM      Result Value Range   Sodium 141  135 - 145 mEq/L   Potassium 3.7  3.5 - 5.1 mEq/L   Chloride 106  96 - 112 mEq/L   CO2 27  19 - 32 mEq/L   Glucose, Bld 97  70 - 99 mg/dL   BUN 4 (*) 6 - 23 mg/dL   Creatinine, Ser 7.82  0.50 - 1.10 mg/dL   Calcium 9.4  8.4 - 95.6 mg/dL   Total Protein 7.4  6.0 - 8.3 g/dL   Albumin 3.9  3.5 - 5.2 g/dL   AST 12  0 - 37 U/L   ALT 15  0 - 35 U/L   Alkaline Phosphatase 106  39 - 117 U/L   Total Bilirubin 0.1 (*) 0.3 - 1.2 mg/dL   GFR calc non Af Amer >90  >90 mL/min   GFR calc Af Amer >90  >90 mL/min   Comment:            The eGFR has been calculated     using the CKD EPI equation.     This calculation has not been     validated in all clinical     situations.     eGFR's persistently     <90 mL/min signify     possible Chronic Kidney Disease.  ETHANOL     Status: None   Collection  Time    03/24/13  4:46 PM      Result Value Range   Alcohol, Ethyl (B) <11  0 - 11 mg/dL   Comment:            LOWEST DETECTABLE LIMIT FOR     SERUM ALCOHOL IS 11 mg/dL     FOR MEDICAL PURPOSES ONLY  SALICYLATE LEVEL     Status: Abnormal   Collection Time    03/24/13  4:46 PM      Result Value Range   Salicylate Lvl <2.0 (*) 2.8 - 20.0 mg/dL  URINE RAPID DRUG  SCREEN (HOSP PERFORMED)     Status: Abnormal   Collection Time    03/24/13  5:14 PM      Result Value Range   Opiates NONE DETECTED  NONE DETECTED   Cocaine NONE DETECTED  NONE DETECTED   Benzodiazepines POSITIVE (*) NONE DETECTED   Amphetamines NONE DETECTED  NONE DETECTED   Tetrahydrocannabinol NONE DETECTED  NONE DETECTED   Barbiturates POSITIVE (*) NONE DETECTED   Comment:            DRUG SCREEN FOR MEDICAL PURPOSES     ONLY.  IF CONFIRMATION IS NEEDED     FOR ANY PURPOSE, NOTIFY LAB     WITHIN 5 DAYS.                LOWEST DETECTABLE LIMITS     FOR URINE DRUG SCREEN     Drug Class       Cutoff (ng/mL)     Amphetamine      1000     Barbiturate      200     Benzodiazepine   200     Tricyclics       300     Opiates          300     Cocaine          300     THC              50  POCT PREGNANCY, URINE     Status: None   Collection Time    03/24/13  5:22 PM      Result Value Range   Preg Test, Ur NEGATIVE  NEGATIVE   Comment:            THE SENSITIVITY OF THIS     METHODOLOGY IS >24 mIU/mL   Psychological Evaluations:  Assessment:   AXIS I:  Bipolar, mixed, Substance Abuse and Substance Induced Mood Disorder AXIS II:  Cluster B Traits AXIS III:   Past Medical History  Diagnosis Date  . Back pain, chronic   . Seizures     pt had 1 seizure October 2012- no other history of seizures  . Chronic back pain   . Depression    AXIS IV:  economic problems, occupational problems, other psychosocial or environmental problems, problems related to social environment and problems with access to health care services AXIS V:  41-50 serious symptoms  Treatment Plan/Recommendations:  Admit  Treatment Plan Summary: Daily contact with patient to assess and evaluate symptoms and progress in treatment Medication management Current Medications:  Current Facility-Administered Medications  Medication Dose Route Frequency Provider Last Rate Last Dose  . acetaminophen (TYLENOL) tablet  650 mg  650 mg Oral Q6H PRN Kerry Hough, PA-C      . albuterol (PROVENTIL HFA;VENTOLIN HFA) 108 (90 BASE) MCG/ACT inhaler 2 puff  2 puff Inhalation  Q6H PRN Kerry Hough, PA-C      . alum & mag hydroxide-simeth (MAALOX/MYLANTA) 200-200-20 MG/5ML suspension 30 mL  30 mL Oral Q4H PRN Kerry Hough, PA-C      . butalbital-acetaminophen-caffeine (FIORICET, ESGIC) [phone] MG per tablet 2 tablet  2 tablet Oral BID PRN Kerry Hough, PA-C      . cyclobenzaprine (FLEXERIL) tablet 10 mg  10 mg Oral TID PRN Kerry Hough, PA-C   10 mg at 03/25/13 0842  . diazepam (VALIUM) tablet 10 mg  10 mg Oral Q12H PRN Nehemiah Settle, MD      . gabapentin (NEURONTIN) capsule 600 mg  600 mg Oral TID Kerry Hough, PA-C   600 mg at 03/25/13 0841  . hydrOXYzine (ATARAX/VISTARIL) tablet 50 mg  50 mg Oral Q6H PRN Nehemiah Settle, MD      . ibuprofen (ADVIL,MOTRIN) tablet 600 mg  600 mg Oral Q8H PRN Kerry Hough, PA-C   600 mg at 03/25/13 0357  . lurasidone (LATUDA) tablet 40 mg  40 mg Oral Q breakfast Nehemiah Settle, MD      . magnesium hydroxide (MILK OF MAGNESIA) suspension 30 mL  30 mL Oral Daily PRN Kerry Hough, PA-C      . nicotine (NICODERM CQ - dosed in mg/24 hours) patch 21 mg  21 mg Transdermal Daily Nehemiah Settle, MD   21 mg at 03/25/13 0948  . progesterone (PROMETRIUM) capsule 200 mg  200 mg Oral QHS Nehemiah Settle, MD      . traMADol Janean Sark) tablet 50 mg  50 mg Oral Q6H PRN Kerry Hough, PA-C   50 mg at 03/25/13 0842  . traZODone (DESYREL) tablet 50 mg  50 mg Oral QHS,MR X 1 Kerry Hough, PA-C   50 mg at 03/25/13 0056    Observation Level/Precautions:  15 minute checks  Laboratory:  As per admission labs completed at [facility]  Psychotherapy:  Group therapy, milieu therapy, supportive therapy, substance abuse counseling   Medications:  Start Latuda 40 mg daily, discontinue Brintellix, continue valium, and Vistaril and Nicotine patch     Consultations: None    Discharge Concerns:   safety and suicidal ideations   Estimated LOS: 5-7 days   Other:  substance abuse rehabilitation    I certify that inpatient services furnished can reasonably be expected to improve the patient's condition.   Rheya Minogue,JANARDHAHA R. 6/18/201410:45 AM

## 2013-03-25 NOTE — Progress Notes (Signed)
D: Patient denies SI/HI and A/V hallucinations; patient has complaints of anxiety and that her antidepressant is not working; patient has complaints of fatigue  A: Monitored q 15 minutes; patient encouraged to attend groups; patient educated about medications; patient given medications per physician orders; patient encouraged to express feelings and/or concerns  R: Patient is reporting anxiety and back pain; patient's interaction with staff and peers is appropriate; patient is taking medications as prescribed and tolerating medications; patient attended some of the afternoon group but slept most of the day

## 2013-03-25 NOTE — Tx Team (Signed)
Initial Interdisciplinary Treatment Plan  PATIENT STRENGTHS: (choose at least two) Average or above average intelligence General fund of knowledge  PATIENT STRESSORS: Financial difficulties Medication change or noncompliance   PROBLEM LIST: Problem List/Patient Goals Date to be addressed Date deferred Reason deferred Estimated date of resolution  Depression      Medication management/Compliance      Risk for suicide                                           DISCHARGE CRITERIA:  Improved stabilization in mood, thinking, and/or behavior Medical problems require only outpatient monitoring Motivation to continue treatment in a less acute level of care  PRELIMINARY DISCHARGE PLAN: Attend aftercare/continuing care group Attend PHP/IOP  PATIENT/FAMIILY INVOLVEMENT: This treatment plan has been presented to and reviewed with the patient, Erin [name].  The patient and family have been given the opportunity to ask questions and make suggestions.  Jacques Navy A 03/25/2013, 1:21 AM

## 2013-03-25 NOTE — [facility] Suicide Risk Assessment (Signed)
Suicide Risk Assessment  Admission Assessment     Nursing information obtained from:  Patient Demographic factors:  Divorced or widowed;Unemployed;Low socioeconomic status Current Mental Status:  NA Loss Factors:  Financial problems / change in socioeconomic status Historical Factors:  Prior suicide attempts Risk Reduction Factors:  Employed;Positive social support;Responsible for children under 42 years of age  CLINICAL FACTORS:   Severe Anxiety and/or Agitation Bipolar Disorder:   Mixed State Depression:   Anhedonia Comorbid alcohol abuse/dependence Hopelessness Impulsivity Insomnia Recent sense of peace/wellbeing Severe Alcohol/Substance Abuse/Dependencies Personality Disorders:   Comorbid alcohol abuse/dependence Previous Psychiatric Diagnoses and Treatments Medical Diagnoses and Treatments/Surgeries  COGNITIVE FEATURES THAT CONTRIBUTE TO RISK:  Closed-mindedness Loss of executive function Polarized thinking    SUICIDE RISK:   Severe:  Frequent, intense, and enduring suicidal ideation, specific plan, no subjective intent, but some objective markers of intent (i.e., choice of lethal method), the method is accessible, some limited preparatory behavior, evidence of impaired self-control, severe dysphoria/symptomatology, multiple risk factors present, and few if any protective factors, particularly a lack of social support.  PLAN OF CARE: Admit emergently and voluntarily for severe depression and suicidal ideation and has history of multiple suicidal attempts and impulsive behaviors like drug abuse.   I certify that inpatient services furnished can reasonably be expected to improve the patient's condition.  Erin [name],Erin R. 03/25/2013, 10:36 AM

## 2013-03-25 NOTE — Progress Notes (Signed)
Adult Psychoeducational Group Note  Date:  03/25/2013 Time:  9:42 PM  Group Topic/Focus:  Wrap-Up Group:   The focus of this group is to help patients review their daily goal of treatment and discuss progress on daily workbooks.  Participation Level:  Active  Participation Quality:  Appropriate  Affect:  Appropriate  Cognitive:  Alert and Oriented  Insight: Appropriate  Engagement in Group:  Developing/Improving  Modes of Intervention:  Exploration, Problem-solving and Support  Additional Comments:  Pt stated that one positive is that she was able to get her meds changed. Pt stated that she has also been able to interact with others. Pt stated that she wants to work on not isolating herself and work on her depression.  Johnny Gorter, Randal Buba 03/25/2013, 9:42 PM

## 2013-03-25 NOTE — [facility] Group Notes (Signed)
[facility] LCSW Group Therapy  03/25/2013 2:35 PM  Type of Therapy:  Group Therapy  Participation Level:  Active  Participation Quality:  Attentive and Sharing  Affect:  Depressed  Cognitive:  Alert and Oriented  Insight:  Improving  Engagement in Therapy:  Engaged  Modes of Intervention:  Discussion, Exploration, Problem-solving, Rapport Building, Socialization and Support  Summary of Progress/Problems: The topic for group today was emotional regulation.  This group focused on both positive and negative emotion identification and allowed group members to process ways to identify feelings, regulate negative emotions, and find healthy ways to manage internal/external emotions. Group members were asked to reflect on a time when their reaction to an emotion led to a negative outcome and explored how alternative responses using emotion regulation would have benefited them. Group members were also asked to discuss a time when emotion regulation was utilized when a negative emotion was experienced.  Erin [name] was observed to be reserved in group initially but then she disclosed her thoughts towards emotions and how she currently identifies with the feeling of sadness. She discussed her diagnosis of bipolar disorder and verbalized her past experiences with severe depression. Erin [name] shared with her peers that her depression has caused severe impairments in her social functioning, evidenced by minimal desire to be around others including her boyfriend. She demonstrated improving engagement within group as she received the emotional support from her peers and CSW. Erin [name] ended the session by stating her desire to rectify her psychotropic medications and take control of her emotions.   Paulino Door, Matteus Mcnelly C 03/25/2013, 2:35 PM

## 2013-03-25 NOTE — Progress Notes (Signed)
Adult Psychoeducational Group Note  Date:  03/25/2013 Time:  11:49 AM  Group Topic/Focus:  Personal Choices and Values:   The focus of this group is to help patients assess and explore the importance of values in their lives, how their values affect their decisions, how they express their values and what opposes their expression.  Participation Level:  Did Not Attend  Participation Quality:  Did not Attend  Affect:  Did not Attend  Cognitive:  Did not Attend  Insight: None  Engagement in Group:  Did not Attend  Modes of Intervention:  Did not Attend  Additional Comments:  This Clinical research associate asked patient to attend group and patient stated they were waiting on the doctor for a medication change.  Cathlean Cower 03/25/2013, 11:49 AM

## 2013-03-25 NOTE — Progress Notes (Signed)
Patient ID: Erin [name], female   DOB: 22-Feb-1971, 42 y.o.   MRN: [id number] D: Pt. C/o pain and anxiety at 10 of 10. 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 A: Writer introduced self to client, administered meds(See MAR). Writer encouraged pt. To use coping skills also i.e. Guided imagery etc. Writer encouraged group. Staff will monitor q26min for safety. R: Pt. Attended group and is safe on the unit.

## 2013-03-25 NOTE — Progress Notes (Signed)
Patient ID: Erin [name], female   DOB: [birth year], 42 y.o.   MRN: [id number]  Admission Note:  D:41 yr female who presents [facility] in no acute distress for the treatment of  Depression and medication management . Pt appears flat and depressed, tearful and emotional. Pt was calm and cooperative with admission process. Pt Denies SI/HI/AVH   and contracts for safety upon admission. Pt states she had Gallbladder surgery 2 weeks ago and things have been getting worse and to prevent herself from having SI she thought it was time to come in for some help. Pt states she was started on new medication and it has not been helping. Pt would like to get her medications stabilized while here.   A: Skin was assessed and found to be clear of any abnormal marks apart from Tattoo R-side, L-back, Incisions on abdomen from gall bladder surgery.POC and unit policies explained and understanding verbalized. Consents obtained. Food and fluids offered, and fluids accepted.  R: Pt had no additional questions or concerns.

## 2013-03-25 NOTE — Progress Notes (Signed)
Recreation Therapy Notes  Date: 06.18.2014 Time: 3:00pm Location: [address] Dayroom      Group Topic/Focus: Communication, Journalist, newspaper, Team Work  Participation Level: Did not attend  [employer], LRT/CTRS  Jearl Klinefelter 03/25/2013 5:15 PM

## 2013-03-25 NOTE — Tx Team (Signed)
Interdisciplinary Treatment Plan Update (Adult)  Date: 03/25/2013  Time Reviewed:  9:45 AM  Progress in Treatment: Attending groups: Yes Participating in groups:  Yes Taking medication as prescribed:  Yes Tolerating medication:  Yes Family/Significant othe contact made: CSW assessing  Patient understands diagnosis:  Yes Discussing patient identified problems/goals with staff:  Yes Medical problems stabilized or resolved:  Yes Denies suicidal/homicidal ideation: Yes Issues/concerns per patient self-inventory:  Yes Other:  New problem(s) identified: N/A  Discharge Plan or Barriers: CSW assessing for appropriate referrals.  Reason for Continuation of Hospitalization: Anxiety Depression Medication Stabilization  Comments: N/A  Estimated length of stay: 3-5 days  For review of initial/current patient goals, please see plan of care.  Attendees: Patient:     Family:     Physician:  Dr. Javier Glazier 03/25/2013 11:09 AM   Nursing:   Lowella Grip, RN 03/25/2013 11:09 AM   Clinical Social Worker:  Reyes Ivan, LCSWA 03/25/2013 11:09 AM   Other: Tacy Learn, RN 03/25/2013 11:09 AM   Other:  Frankey Shown, MA care coordination 03/25/2013 11:09 AM   Other:  Juline Patch, LCSW 03/25/2013 11:09 AM   Other:     Other:    Other:    Other:    Other:    Other:    Other:     Scribe for Treatment Team:   Carmina Miller, 03/25/2013 11:09 AM

## 2013-03-26 DIAGNOSIS — F1994 Other psychoactive substance use, unspecified with psychoactive substance-induced mood disorder: Secondary | ICD-10-CM

## 2013-03-26 DIAGNOSIS — F3160 Bipolar disorder, current episode mixed, unspecified: Principal | ICD-10-CM

## 2013-03-26 DIAGNOSIS — F1910 Other psychoactive substance abuse, uncomplicated: Secondary | ICD-10-CM

## 2013-03-26 MED ORDER — CLONIDINE HCL 0.1 MG PO TABS
0.1000 mg | ORAL_TABLET | Freq: Four times a day (QID) | ORAL | Status: DC | PRN
  Administered 2013-03-26 – 2013-03-27 (×3): 0.1 mg via ORAL
  Filled 2013-03-26: qty 1

## 2013-03-26 MED ORDER — MIRTAZAPINE 15 MG PO TABS
15.0000 mg | ORAL_TABLET | Freq: Every day | ORAL | Status: DC
  Administered 2013-03-26 – 2013-03-29 (×4): 15 mg via ORAL
  Filled 2013-03-26 (×4): qty 1
  Filled 2013-03-26 (×2): qty 14
  Filled 2013-03-26 (×2): qty 1

## 2013-03-26 MED ORDER — HYDROXYZINE HCL 50 MG PO TABS: 50.0000 mg | ORAL_TABLET | Freq: Every evening | ORAL | Status: DC | PRN

## 2013-03-26 MED ORDER — TRAZODONE HCL 50 MG PO TABS
50.0000 mg | ORAL_TABLET | Freq: Every evening | ORAL | Status: DC | PRN
  Administered 2013-03-26 – 2013-03-29 (×3): 50 mg via ORAL
  Filled 2013-03-26 (×2): qty 1
  Filled 2013-03-26: qty 14

## 2013-03-26 NOTE — Progress Notes (Signed)
Patient ID: Erin [name], female   DOB: 1971/09/04, 42 y.o.   MRN: [id number]  D: Pt denies SI/HI/AVH. Pt is pleasant and cooperative. "I had a good day, I had no sleep in 2 days". Pt continues to be worried about not getting sleep. Pt observed in dayroom watching TV and interacting with other pt's. Pt concerned about diet after gallbladder surgery.  A: Pt was offered support and encouragement. Pt was given scheduled medications. Pt was encourage to attend groups. Q 15 minute checks were done for safety. Pt given information about diet and encouraged to speak to her doctor about the introduction of foods to her diet.   R:Pt attended Karaoke and interacts well with peers and staff. Pt is taking medication. Pt receptive to treatment and safety maintained on unit.

## 2013-03-26 NOTE — Progress Notes (Signed)
[facility] MD Progress Note  03/26/2013 3:26 PM Erin [name]  MRN:  [id number] Subjective:  Patient very concerned today about not sleeping but one hour last night. She does feel the Kasandra Knudsen is helping to stabilize her mood stating "I have been able to function today despite having no sleep. That is a difference for me." Rates depression at five. Appears very anxious and worried about not sleeping tonight. Patient feels that she is experiencing some opiate withdrawal stating "My whole body aches and I was sweating." Patient reports having a bad experience with the clonidine patch but states "I want to try the oral tablet. I think I will be fine. That patch was too much before."  Diagnosis:   Axis I: Bipolar, mixed, Substance Abuse and Substance Induced Mood Disorder Axis II: Cluster B Traits Axis III:  Past Medical History  Diagnosis Date  . Back pain, chronic   . Seizures     pt had 1 seizure October 2012- no other history of seizures  . Chronic back pain   . Depression    Axis IV: economic problems, occupational problems, other psychosocial or environmental problems, problems related to social environment and problems with access to health care services Axis V: 41-50 serious symptoms  ADL's:  Intact  Sleep: Poor  Appetite:  Poor  Suicidal Ideation:  Passive SI-Patient worries about not being able to function if the Latuda does Not increase her energy level.  Homicidal Ideation:  Denies AEB (as evidenced by):  Psychiatric Specialty Exam: Review of Systems  Constitutional: Negative.   HENT: Negative.   Eyes: Negative.   Respiratory: Negative.   Cardiovascular: Negative.   Gastrointestinal: Negative.   Genitourinary: Negative.   Musculoskeletal: Positive for back pain and joint pain.  Skin: Negative.   Neurological: Negative.   Endo/Heme/Allergies: Negative.   Psychiatric/Behavioral: Positive for depression and substance abuse. Negative for suicidal ideas, hallucinations and  memory loss. The patient is nervous/anxious and has insomnia.     Blood pressure 102/72, pulse 81, temperature 98.2 F (36.8 C), temperature source Oral, resp. rate 18, height 5\' 9"  (1.753 m), weight 66.679 kg (147 lb).Body mass index is 21.7 kg/(m^2).  General Appearance: Disheveled  Eye Solicitor::  Fair  Speech:  Clear and Coherent  Volume:  Normal  Mood:  Anxious and Dysphoric  Affect:  Flat  Thought Process:  Goal Directed  Orientation:  Full (Time, Place, and Person)  Thought Content:  WDL  Suicidal Thoughts:  No  Homicidal Thoughts:  No  Memory:  Immediate;   Good Recent;   Good Remote;   Good  Judgement:  Impaired  Insight:  Fair  Psychomotor Activity:  Normal  Concentration:  Fair  Recall:  Good  Akathisia:  No  Handed:  Right  AIMS (if indicated):     Assets:  Communication Skills Desire for Improvement Leisure Time Social Support Talents/Skills  Sleep:  Number of Hours: 2.5   Current Medications: Current Facility-Administered Medications  Medication Dose Route Frequency Provider Last Rate Last Dose  . acetaminophen (TYLENOL) tablet 650 mg  650 mg Oral Q6H PRN Kerry Hough, PA-C      . albuterol (PROVENTIL HFA;VENTOLIN HFA) 108 (90 BASE) MCG/ACT inhaler 2 puff  2 puff Inhalation Q6H PRN Kerry Hough, PA-C      . alum & mag hydroxide-simeth (MAALOX/MYLANTA) 200-200-20 MG/5ML suspension 30 mL  30 mL Oral Q4H PRN Kerry Hough, PA-C      . butalbital-acetaminophen-caffeine (FIORICET, ESGIC) 50-325-40 MG per tablet 2  tablet  2 tablet Oral BID PRN Kerry Hough, PA-C   2 tablet at 03/26/13 0810  . cloNIDine (CATAPRES) tablet 0.1 mg  0.1 mg Oral Q6H PRN Fransisca Kaufmann, NP      . cyclobenzaprine (FLEXERIL) tablet 10 mg  10 mg Oral TID PRN Kerry Hough, PA-C   10 mg at 03/26/13 1454  . diazepam (VALIUM) tablet 10 mg  10 mg Oral Q12H PRN Nehemiah Settle, MD   10 mg at 03/26/13 0500  . gabapentin (NEURONTIN) capsule 600 mg  600 mg Oral TID Kerry Hough,  PA-C   600 mg at 03/26/13 1154  . hydrOXYzine (ATARAX/VISTARIL) tablet 50 mg  50 mg Oral Q6H PRN Nehemiah Settle, MD   50 mg at 03/26/13 1454  . ibuprofen (ADVIL,MOTRIN) tablet 600 mg  600 mg Oral Q8H PRN Kerry Hough, PA-C   600 mg at 03/25/13 2306  . lurasidone (LATUDA) tablet 40 mg  40 mg Oral Q breakfast Nehemiah Settle, MD   40 mg at 03/26/13 0706  . magnesium hydroxide (MILK OF MAGNESIA) suspension 30 mL  30 mL Oral Daily PRN Kerry Hough, PA-C      . mirtazapine (REMERON) tablet 15 mg  15 mg Oral QHS Fransisca Kaufmann, NP      . nicotine (NICODERM CQ - dosed in mg/24 hours) patch 21 mg  21 mg Transdermal Daily Nehemiah Settle, MD   21 mg at 03/26/13 0709  . progesterone (PROMETRIUM) capsule 200 mg  200 mg Oral QHS Nehemiah Settle, MD   200 mg at 03/25/13 2150  . traMADol (ULTRAM) tablet 50 mg  50 mg Oral Q6H PRN Kerry Hough, PA-C   50 mg at 03/26/13 1454    Lab Results:  Results for orders placed during the hospital encounter of 03/24/13 (from the past 48 hour(s))  ACETAMINOPHEN LEVEL     Status: None   Collection Time    03/24/13  4:46 PM      Result Value Range   Acetaminophen (Tylenol), Serum <15.0  10 - 30 ug/mL   Comment:            THERAPEUTIC CONCENTRATIONS VARY     SIGNIFICANTLY. A RANGE OF 10-30     ug/mL MAY BE AN EFFECTIVE     CONCENTRATION FOR MANY PATIENTS.     HOWEVER, SOME ARE BEST TREATED     AT CONCENTRATIONS OUTSIDE THIS     RANGE.     ACETAMINOPHEN CONCENTRATIONS     >150 ug/mL AT 4 HOURS AFTER     INGESTION AND >50 ug/mL AT 12     HOURS AFTER INGESTION ARE     OFTEN ASSOCIATED WITH TOXIC     REACTIONS.  CBC     Status: None   Collection Time    03/24/13  4:46 PM      Result Value Range   WBC 9.2  4.0 - 10.5 K/uL   RBC 4.15  3.87 - 5.11 MIL/uL   Hemoglobin 13.6  12.0 - 15.0 g/dL   HCT 16.1  09.6 - 04.5 %   MCV 93.0  78.0 - 100.0 fL   MCH 32.8  26.0 - 34.0 pg   MCHC 35.2  30.0 - 36.0 g/dL   RDW 40.9  81.1  - 91.4 %   Platelets 273  150 - 400 K/uL  COMPREHENSIVE METABOLIC PANEL     Status: Abnormal   Collection Time    03/24/13  4:46 PM      Result Value Range   Sodium 141  135 - 145 mEq/L   Potassium 3.7  3.5 - 5.1 mEq/L   Chloride 106  96 - 112 mEq/L   CO2 27  19 - 32 mEq/L   Glucose, Bld 97  70 - 99 mg/dL   BUN 4 (*) 6 - 23 mg/dL   Creatinine, Ser 1.19  0.50 - 1.10 mg/dL   Calcium 9.4  8.4 - 14.7 mg/dL   Total Protein 7.4  6.0 - 8.3 g/dL   Albumin 3.9  3.5 - 5.2 g/dL   AST 12  0 - 37 U/L   ALT 15  0 - 35 U/L   Alkaline Phosphatase 106  39 - 117 U/L   Total Bilirubin 0.1 (*) 0.3 - 1.2 mg/dL   GFR calc non Af Amer >90  >90 mL/min   GFR calc Af Amer >90  >90 mL/min   Comment:            The eGFR has been calculated     using the CKD EPI equation.     This calculation has not been     validated in all clinical     situations.     eGFR's persistently     <90 mL/min signify     possible Chronic Kidney Disease.  ETHANOL     Status: None   Collection Time    03/24/13  4:46 PM      Result Value Range   Alcohol, Ethyl (B) <11  0 - 11 mg/dL   Comment:            LOWEST DETECTABLE LIMIT FOR     SERUM ALCOHOL IS 11 mg/dL     FOR MEDICAL PURPOSES ONLY  SALICYLATE LEVEL     Status: Abnormal   Collection Time    03/24/13  4:46 PM      Result Value Range   Salicylate Lvl <2.0 (*) 2.8 - 20.0 mg/dL  URINE RAPID DRUG SCREEN (HOSP PERFORMED)     Status: Abnormal   Collection Time    03/24/13  5:14 PM      Result Value Range   Opiates NONE DETECTED  NONE DETECTED   Cocaine NONE DETECTED  NONE DETECTED   Benzodiazepines POSITIVE (*) NONE DETECTED   Amphetamines NONE DETECTED  NONE DETECTED   Tetrahydrocannabinol NONE DETECTED  NONE DETECTED   Barbiturates POSITIVE (*) NONE DETECTED   Comment:            DRUG SCREEN FOR MEDICAL PURPOSES     ONLY.  IF CONFIRMATION IS NEEDED     FOR ANY PURPOSE, NOTIFY LAB     WITHIN 5 DAYS.                LOWEST DETECTABLE LIMITS     FOR URINE  DRUG SCREEN     Drug Class       Cutoff (ng/mL)     Amphetamine      1000     Barbiturate      200     Benzodiazepine   200     Tricyclics       300     Opiates          300     Cocaine          300     THC              50  POCT PREGNANCY, URINE     Status: None   Collection Time    03/24/13  5:22 PM      Result Value Range   Preg Test, Ur NEGATIVE  NEGATIVE   Comment:            THE SENSITIVITY OF THIS     METHODOLOGY IS >24 mIU/mL    Physical Findings: AIMS: Facial and Oral Movements Muscles of Facial Expression: None, normal Lips and Perioral Area: None, normal Jaw: None, normal Tongue: None, normal,Extremity Movements Upper (arms, wrists, hands, fingers): None, normal Lower (legs, knees, ankles, toes): None, normal, Trunk Movements Neck, shoulders, hips: None, normal, Overall Severity Severity of abnormal movements (highest score from questions above): None, normal Incapacitation due to abnormal movements: None, normal Patient's awareness of abnormal movements (rate only patient's report): No Awareness, Dental Status Current problems with teeth and/or dentures?: No Does patient usually wear dentures?: No  CIWA:  CIWA-Ar Total: 1 COWS:  COWS Total Score: 2  Treatment Plan Summary: Daily contact with patient to assess and evaluate symptoms and progress in treatment Medication management  Plan: Continue crisis management and stabilization.  Medication management: Ordered Clonidine 0.1 mg as needed for opiate withdrawal. D/C trazodone. Start Remeron 15 mg daily at bedtime to improve quality of sleep.  Encouraged patient to attend groups and participate in group counseling sessions and activities.  Discharge plan in progress.  Address health issues: Vitals reviewed and stable.  Continue current treatment plan.   Medical Decision Making Problem Points:  Established problem, stable/improving (1) and Review of psycho-social stressors (1) Data Points:  Review of new  medications or change in dosage (2)  I certify that inpatient services furnished can reasonably be expected to improve the patient's condition.   Kale Rondeau NP-C 03/26/2013, 3:26 PM

## 2013-03-26 NOTE — Progress Notes (Signed)
Adult Psychoeducational Group Note  Date:  03/26/2013 Time:  11:55 AM  Group Topic/Focus:  Overcoming Stress:   The focus of this group is to define stress and help patients assess their triggers.  Participation Level:  Active  Participation Quality:  Appropriate  Affect:  Appropriate  Cognitive:  Appropriate  Insight: Appropriate and Good  Engagement in Group:  Engaged  Modes of Intervention:  Discussion, Education and Support  Additional Comments:  Pt actively participated in group. 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 Reynolds Bowl 03/26/2013, 11:55 AM

## 2013-03-26 NOTE — Progress Notes (Signed)
D: Patient denies SI/HI and auditory and visual hallucinations. The patient has an anxious mood and affect. The patient did not complete her self-inventory sheet. The patient reported not sleeping well to RN and states that she needs "something better than trazodone." The patient is complaining of pain, muscle spasms, and a migraine. Patient is attending groups and interacting appropriately within the milieu.  A: Patient given emotional support from RN. Patient encouraged to come to staff with concerns and/or questions. Patient's medication routine continued. Patient's orders and plan of care reviewed. PRN medications given to patient.  R: Patient remains cooperative. Will continue to monitor patient q15 minutes for safety.

## 2013-03-26 NOTE — Progress Notes (Signed)
Pt attended karaoke group this evening. Pt didn't participate with peers.

## 2013-03-26 NOTE — [facility] Group Notes (Signed)
[facility] LCSW Aftercare Discharge Planning Group Note   03/26/2013 1:57 PM  Participation Quality:  Appropriate  Mood/Affect:  Appropriate and Depressed  Depression Rating:  8  Anxiety Rating:  8  Thoughts of Suicide:  No  Will you contract for safety?   Yes  Current AVH:  NA  Plan for Discharge/Comments:  Patient reports being okay today.  She stated she slept well last night.  Transportation Means: Patient has transportation.   Supports:  Patient has a good support system.   Delorean Knutzen, Joesph July

## 2013-03-26 NOTE — [facility] Suicide Risk Assessment (Signed)
[facility] INPATIENT:  Family/Significant Other Suicide Prevention Education  Suicide Prevention Education:  Education Completed; Erin [name] - mother [phone]),  (name of family member/significant other) has been identified by the patient as the family member/significant other with whom the patient will be residing, and identified as the person(s) who will aid the patient in the event of a mental health crisis (suicidal ideations/suicide attempt).  With written consent from the patient, the family member/significant other has been provided the following suicide prevention education, prior to the and/or following the discharge of the patient.  The suicide prevention education provided includes the following:  Suicide risk factors  Suicide prevention and interventions  National Suicide Hotline telephone number  [facility] assessment telephone number  [facility] Emergency Assistance 911  [facility] and/or Residential Mobile Crisis Unit telephone number  Request made of family/significant other to:  Remove weapons (e.g., guns, rifles, knives), all items previously/currently identified as safety concern.    Remove drugs/medications (over-the-counter, prescriptions, illicit drugs), all items previously/currently identified as a safety concern.  The family member/significant other verbalizes understanding of the suicide prevention education information provided.  The family member/significant other agrees to remove the items of safety concern listed above.  Erin [name] 03/26/2013, 11:26 AM

## 2013-03-26 NOTE — B[facility] Counselor (Signed)
Adult Comprehensive Assessment  Patient ID: Erin [name], female   DOB: [birth year], 42 y.o.   MRN: [id number]  Information Source: Information source: Patient  Current Stressors:  Educational / Learning stressors: N/A Employment / Job issues: Unemployed, wants to work but unable to drive because license is revoked from past DWI's Family Relationships: N/A Surveyor, quantity / Lack of resources (include bankruptcy): Depends on boyfriend for all finances Housing / Lack of housing: N/A Physical health (include injuries & life threatening diseases): had gallbladder out 3 weeks ago Social relationships: N/A Substance abuse: Past history of pain pill addiction, was taking pain pills from surgery as prescribed and panicked when they ran out Bereavement / Loss: dad, uncle and cousin committed suicide, all over 10 years ago  Living/Environment/Situation:  Living Arrangements: Spouse/significant other;Children Living conditions (as described by patient or guardian): Pt lives with long term boyfriend and daughter in [place].  Pt states that it is a good environment. How long has patient lived in current situation?: 2 years What is atmosphere in current home: Supportive;Loving;Comfortable  Family History:  Marital status: Divorced Divorced, when?: 2001 Long term relationship, how long?: 13 years What types of issues is patient dealing with in the relationship?: No significant issues Additional relationship information: Pt currently with long term boyfriend of 13 years.  Pt states that it is a good relationship.  Does patient have children?: Yes How many children?: 1 How is patient's relationship with their children?: 10 year old daughter - pt states that she has a good relationship with her daughter.    Childhood History:  By whom was/is the patient raised?: Mother;Mother/father and step-parent;Father Additional childhood history information: Pt states that she was raised by her mother, stpe father  and saw her father every other weekend.  Pt states that her childhood was great and couldn't have been any better.   Description of patient's relationship with caregiver when they were a child: Pt states that she got along great with parents growing up. Patient's description of current relationship with people who raised him/her: Pt states that she still has a great relationship with both mother and step father today.  Father committed suicide 12 years ago.   Does patient have siblings?: Yes Number of Siblings: 1 Description of patient's current relationship with siblings: Pt has a younger sister and has a close relationship with her.   Did patient suffer any verbal/emotional/physical/sexual abuse as a child?: No Did patient suffer from severe childhood neglect?: No Has patient ever been sexually abused/assaulted/raped as an adolescent or adult?: No Was the patient ever a victim of a crime or a disaster?: No Witnessed domestic violence?: No Has patient been effected by domestic violence as an adult?: No  Education:  Highest grade of school patient has completed: high school, cosmetology school  Currently a student?: No Learning disability?: No  Employment/Work Situation:   Employment situation: Unemployed Patient's job has been impacted by current illness: No What is the longest time patient has a held a job?: 15 years Where was the patient employed at that time?: [employer] Has patient ever been in the [employer]?: No Has patient ever served in Buyer, retail?: No  Financial Resources:   Surveyor, quantity resources: Support from parents / caregiver;Medicaid;Food stamps Does patient have a representative payee or guardian?: No  Alcohol/Substance Abuse:   What has been your use of drugs/alcohol within the last 12 months?: Alcohol - few beers occasionally If attempted suicide, did drugs/alcohol play a role in this?: No Alcohol/Substance Abuse Treatment Hx: Past  detox If yes, describe treatment: [facility] - 1  year ago to detox off of percocet  Has alcohol/substance abuse ever caused legal problems?: Yes (2 DWI's - haven't driven in 4 years bc unable to drive)  Social Support System:   Patient's Community Support System: Good Describe Community Support System: Pt states that her mother and sister are very supportive Type of faith/religion: [employer]ri[employer] Knuckles How does patient's faith help to cope with current illness?: prayer, occasional church attendance  Leisure/Recreation:   Leisure and Hobbies: Spending time with her daughter, shopping, go to the lake, camping  Strengths/Needs:   What things does the patient do well?: Pt states that she is good at doing hair In what areas does patient struggle / problems for patient: Depression  Discharge Plan:   Does patient have access to transportation?: Yes Will patient be returning to same living situation after discharge?: Yes Currently receiving community mental health services: No If no, would patient like referral for services when discharged?: Yes (What county?) [facility] [place]) Does patient have financial barriers related to discharge medications?: No  Summary/Recommendations:     Patient is a 42 year old Caucasian Female with a diagnosis of Mood Disorder NOS.  Patient lives in [place], [place] with boyfriend and daughter.  Pt states that she has been depressed for awhile with increased anxiety after a recent gallbladder surgery.  Pt states that she gets depressed about her driving situation (unable to drive due to past DWI's) and therefore unable to work.  Patient will benefit from crisis stabilization, medication evaluation, group therapy and psycho education in addition to case management for discharge planning.    [place]orton, Salome Arnt. 03/26/2013

## 2013-03-27 MED ORDER — DIAZEPAM 5 MG PO TABS: 5.0000 mg | ORAL_TABLET | Freq: Two times a day (BID) | ORAL | Status: DC | PRN

## 2013-03-27 MED ORDER — DIAZEPAM 5 MG PO TABS
5.0000 mg | ORAL_TABLET | Freq: Two times a day (BID) | ORAL | Status: AC | PRN
  Administered 2013-03-28 – 2013-03-29 (×2): 5 mg via ORAL
  Filled 2013-03-27 (×2): qty 1

## 2013-03-27 MED ORDER — DIAZEPAM 5 MG PO TABS
5.0000 mg | ORAL_TABLET | Freq: Once | ORAL | Status: AC
  Administered 2013-03-27: 5 mg via ORAL
  Filled 2013-03-27: qty 1

## 2013-03-27 MED ORDER — CELECOXIB 100 MG PO CAPS
100.0000 mg | ORAL_CAPSULE | Freq: Two times a day (BID) | ORAL | Status: DC
  Administered 2013-03-27 – 2013-03-30 (×6): 100 mg via ORAL
  Filled 2013-03-27: qty 1
  Filled 2013-03-27 (×2): qty 6
  Filled 2013-03-27 (×4): qty 1
  Filled 2013-03-27 (×2): qty 6
  Filled 2013-03-27 (×3): qty 1

## 2013-03-27 MED ORDER — CLONIDINE HCL 0.1 MG PO TABS
0.1000 mg | ORAL_TABLET | Freq: Two times a day (BID) | ORAL | Status: DC
  Administered 2013-03-27 – 2013-03-30 (×6): 0.1 mg via ORAL
  Filled 2013-03-27 (×10): qty 1

## 2013-03-27 NOTE — Progress Notes (Signed)
[facility] MD Progress Note  03/27/2013 2:02 PM Celise Bazar  MRN:  [id number] Subjective: "I'm frustrated at the moment." Objective: Patient is restless but feels that the Remeron helped. Slept better. Reviewed medications and d/c'd prometrium due to side effects.  Diagnosis:  Major depressive disorder severe recurrent  ADL's:  Intact  Sleep: Fair  Appetite:  Fair  Suicidal Ideation:  denies Homicidal Ideation:  denies AEB (as evidenced by): patient 's report of improved symptoms.  Psychiatric Specialty Exam: Review of Systems  Constitutional: Negative.  Negative for fever, chills, weight loss, malaise/fatigue and diaphoresis.  HENT: Negative for congestion and sore throat.   Eyes: Negative for blurred vision, double vision and photophobia.  Respiratory: Negative for cough, shortness of breath and wheezing.   Cardiovascular: Negative for chest pain, palpitations and PND.  Gastrointestinal: Negative for heartburn, nausea, vomiting, abdominal pain, diarrhea and constipation.  Musculoskeletal: Negative for myalgias, joint pain and falls.  Neurological: Negative for dizziness, tingling, tremors, sensory change, speech change, focal weakness, seizures, loss of consciousness, weakness and headaches.  Endo/Heme/Allergies: Negative for polydipsia. Does not bruise/bleed easily.  Psychiatric/Behavioral: Negative for depression, suicidal ideas, hallucinations, memory loss and substance abuse. The patient is not nervous/anxious and does not have insomnia.     Blood pressure 114/82, pulse 108, temperature 97.9 F (36.6 C), temperature source Oral, resp. rate 20, height 5\' 9"  (1.753 m), weight 66.679 kg (147 lb).Body mass index is 21.7 kg/(m^2).  General Appearance: Casual  Eye Contact::  Good  Speech:  Clear and Coherent  Volume:  Normal  Mood:  Anxious and Depressed  Affect:  Congruent  Thought Process:  Goal Directed  Orientation:  Full (Time, Place, and Person)  Thought Content:  WDL   Suicidal Thoughts:  No  Homicidal Thoughts:  No  Memory:  Immediate;   Fair  Judgement:  Fair  Insight:  Present  Psychomotor Activity:  Restlessness  Concentration:  Poor  Recall:  Fair  Akathisia:  No  Handed:  Right  AIMS (if indicated):     Assets:  Communication Skills Desire for Improvement Housing Physical Health Social Support Talents/Skills Vocational/Educational  Sleep:  Number of Hours: 4.75   Current Medications: Current Facility-Administered Medications  Medication Dose Route Frequency Provider Last Rate Last Dose  . acetaminophen (TYLENOL) tablet 650 mg  650 mg Oral Q6H PRN Kerry Hough, PA-C      . albuterol (PROVENTIL HFA;VENTOLIN HFA) 108 (90 BASE) MCG/ACT inhaler 2 puff  2 puff Inhalation Q6H PRN Kerry Hough, PA-C      . alum & mag hydroxide-simeth (MAALOX/MYLANTA) 200-200-20 MG/5ML suspension 30 mL  30 mL Oral Q4H PRN Kerry Hough, PA-C      . celecoxib (CELEBREX) capsule 100 mg  100 mg Oral BID Verne Spurr, PA-C      . cloNIDine (CATAPRES) tablet 0.1 mg  0.1 mg Oral BID Verne Spurr, PA-C      . cyclobenzaprine (FLEXERIL) tablet 10 mg  10 mg Oral TID PRN Kerry Hough, PA-C   10 mg at 03/26/13 1454  . diazepam (VALIUM) tablet 5 mg  5 mg Oral Q12H PRN Verne Spurr, PA-C      . diazepam (VALIUM) tablet 5 mg  5 mg Oral Once Verne Spurr, PA-C      . gabapentin (NEURONTIN) capsule 600 mg  600 mg Oral TID Kerry Hough, PA-C   600 mg at 03/27/13 1144  . hydrOXYzine (ATARAX/VISTARIL) tablet 50 mg  50 mg Oral Q6H PRN Randal Buba  Jonnalagadda, MD   50 mg at 03/27/13 0933  . ibuprofen (ADVIL,MOTRIN) tablet 600 mg  600 mg Oral Q8H PRN Kerry Hough, PA-C   600 mg at 03/25/13 2306  . lurasidone (LATUDA) tablet 40 mg  40 mg Oral Q breakfast Nehemiah Settle, MD   40 mg at 03/27/13 0800  . magnesium hydroxide (MILK OF MAGNESIA) suspension 30 mL  30 mL Oral Daily PRN Kerry Hough, PA-C      . mirtazapine (REMERON) tablet 15 mg  15 mg Oral  QHS Fransisca Kaufmann, NP   15 mg at 03/26/13 2238  . nicotine (NICODERM CQ - dosed in mg/24 hours) patch 21 mg  21 mg Transdermal Daily Nehemiah Settle, MD   21 mg at 03/27/13 0641  . traZODone (DESYREL) tablet 50 mg  50 mg Oral QHS PRN Fransisca Kaufmann, NP   50 mg at 03/26/13 2238    Lab Results: No results found for this or any previous visit (from the past 48 hour(s)).  Physical Findings: AIMS: Facial and Oral Movements Muscles of Facial Expression: None, normal Lips and Perioral Area: None, normal Jaw: None, normal Tongue: None, normal,Extremity Movements Upper (arms, wrists, hands, fingers): None, normal Lower (legs, knees, ankles, toes): None, normal, Trunk Movements Neck, shoulders, hips: None, normal, Overall Severity Severity of abnormal movements (highest score from questions above): None, normal Incapacitation due to abnormal movements: None, normal Patient's awareness of abnormal movements (rate only patient's report): No Awareness, Dental Status Current problems with teeth and/or dentures?: No Does patient usually wear dentures?: No  CIWA:  CIWA-Ar Total: 1 COWS:  COWS Total Score: 2  Treatment Plan Summary: Daily contact with patient to assess and evaluate symptoms and progress in treatment Medication management  Plan: 1. Reviewed medication list with patient and discontinued Prometrium due to side effects. 2. D/C'd Fioricet due to side effects. 3. D/C'd Tramadol due to hx of substance abuse. 4. Lowered the Valium to 5mg  to taper down to stop due to history of substance abuse. 5. Added Celebrex 100mg  po BID for osteoarthritis. 6. Changed Clonidine to Q 12 scheduled for any withdrawal symptoms. 7. Will continue the remainder of these medications as written. 8. Patient will be allowed to go to group programming on 300 for her substance abuse issues.  Medical Decision Making Problem Points:  Established problem, stable/improving (1) and New problem, with no  additional work-up planned (3) Data Points:  Review of medication regiment & side effects (2) Review of new medications or change in dosage (2)  I certify that inpatient services furnished can reasonably be expected to improve the patient's condition.  Rona Ravens. [place]a Hedman RPAC 2:16 PM 03/27/2013

## 2013-03-27 NOTE — Progress Notes (Signed)
Adult Psychoeducational Group Note  Date:  03/27/2013 Time:  10:57 AM  Group Topic/Focus:  Patients engaged in therapeutic activity where they played coping skills pictionary. Each patient was asked to idenitify one coping skill they currently use and one they have learned from the activity.   Participation Level:  Active  Participation Quality:  Attentive  Affect:  Appropriate  Cognitive:  Appropriate  Insight: Good  Engagement in Group:  Engaged  Modes of Intervention:  Activity  Additional Comments:  [name] shared that one coping skill she currently uses is "talking to others about her issues". One coping skill she has learned from the group was walking away from an argument.   Alyson Reedy 03/27/2013, 10:57 AM

## 2013-03-27 NOTE — [facility] Group Notes (Signed)
[facility] LCSW Group Therapy  Feelings Around Relapse 1:15 -2:30        03/27/2013  12:55 PM   Type of Therapy:  Group Therapy  Participation Level:  Patient was meeting with medical staff.   Wynn Banker 03/27/2013 12:55 PM

## 2013-03-27 NOTE — Progress Notes (Signed)
[facility] Group Notes:  (Nursing/MHT/Case Management/Adjunct)  Date:  03/27/2013  Time:  10:25 PM  Type of Therapy:  Group Therapy  Participation Level:  Active  Participation Quality:  Appropriate  Affect:  Appropriate  Cognitive:  Appropriate  Insight:  Appropriate  Engagement in Group:  Engaged  Modes of Intervention:  Socialization and Support  Summary of Progress/Problems: Pt. Stated she would communicate with family more and "get out of bed," for steps to prevent relapse.   Sondra Come 03/27/2013, 10:25 PM

## 2013-03-27 NOTE — Progress Notes (Addendum)
D: Patient appropriate and cooperative with staff and peers. Patient's affect/mood is anxious. Patient complained of migraine pain 7/10; also c/o anxiety and opiate withdrawals. She verbalized that she's not sleeping well at night; pt. shared that she often have a lot of things on her mind that makes her really anxious. Pt. Reported that last night her husband and daughter came to visit and were discussing with her the different things they've been doing lately; she verbalized that the visit made her anxious as well.  A: Support and encouragement provided to patient. Scheduled medications administered per MD orders. PRN Fioricet given for migraine, Vistaril for anxiety and Clonidine for withdrawals. Maintain Q15 minute checks for safety.  R: Patient receptive. She reported decrease in anxiety and withdrawals and no continuation of migraine pain. Denies SI/HI/AVH. Patient remains safe.

## 2013-03-27 NOTE — Progress Notes (Signed)
Adult Psychoeducational Group Note  Date:  03/27/2013 Time:  11:59 AM  Group Topic/Focus:  Early Warning Signs:   The focus of this group is to help patients identify signs or symptoms they exhibit before slipping into an unhealthy state or crisis.  Participation Level:  Active  Participation Quality:  Appropriate, Attentive, Sharing and Supportive  Affect:  Appropriate  Cognitive:  Appropriate  Insight: Appropriate and Good  Engagement in Group:  Engaged  Modes of Intervention:  Discussion, Education and Support  Additional Comments:  [name] attended group and shared during group. Patient participated and shared a time in life where relapse occurred and shard the changes that happened during that time of crisis. Patient shared behavior, attitude, feelings, and thought changes during the time of crisis. Identifying the warning signs and learning how to cope and identify triggers during a time of crisis was a way for patient to understand what could be done differently if in time of need of crisis.  Erin [name] 03/27/2013, 11:59 AM

## 2013-03-27 NOTE — Tx Team (Signed)
Interdisciplinary Treatment Plan Update (Adult)  Date: 03/27/2013  Time Reviewed:  9:45 AM  Progress in Treatment: Attending groups: Yes Participating in groups:  Yes Taking medication as prescribed:  Yes Tolerating medication:  Yes Family/Significant othe contact made: CSW attempting Patient understands diagnosis:  Yes Discussing patient identified problems/goals with staff:  Yes Medical problems stabilized or resolved:  Yes Denies suicidal/homicidal ideation: Yes Issues/concerns per patient self-inventory:  Yes Other:  New problem(s) identified: N/A  Discharge Plan or Barriers: CSW assessing for appropriate referrals.  Reason for Continuation of Hospitalization: Anxiety Depression Medication Stabilization  Comments: N/A  Estimated length of stay: 3-5 days  For review of initial/current patient goals, please see plan of care.  Attendees: Patient:     Family:     Physician:  Dr. Javier Glazier 03/27/2013 10:09 AM   Nursing:   Lowella Grip, RN 03/27/2013 10:09 AM   Clinical Social Worker:  Reyes Ivan, LCSWA 03/27/2013 10:09 AM   Other: Fransisca Kaufmann, NP 03/27/2013 10:09 AM   Other:  Frankey Shown, MA care coordination 03/27/2013 10:09 AM   Other:  Juline Patch, LCSW 03/27/2013 10:09 AM   Other:  Quintella Reichert, RN 03/27/2013 10:09 AM  Other:    Other:    Other:    Other:    Other:    Other:     Scribe for Treatment Team:   Carmina Miller, 03/27/2013 10:09 AM

## 2013-03-28 DIAGNOSIS — F332 Major depressive disorder, recurrent severe without psychotic features: Secondary | ICD-10-CM

## 2013-03-28 LAB — TSH: TSH: 0.47 u[IU]/mL (ref 0.350–4.500)

## 2013-03-28 NOTE — Progress Notes (Signed)
 .  Psychoeducational Group Note    Date: 03/28/2013 Time:  0915   Goal Setting Purpose of Group: To be able to set a goal that is measurable and that can be accomplished in one day Participation Level:  Active  Participation Quality:  Appropriate  Affect:  Appropriate  Cognitive:  Oriented  Insight:  Improving  Engagement in Group:  Engaged  Additional Comments:  Set a reasonable goal for today of talking to the doctor about her poor sleep.  Dione Housekeeper

## 2013-03-28 NOTE — Progress Notes (Signed)
Patient ID: Erin [name], female   DOB: Dec 30, 1970, 42 y.o.   MRN: [id number] D)  Has been out on the hall tonight, participating in the milieu, attended group.  When she came to the med window afterward, she indicated she was tired tonight, hoping her meds would let her get some sleep, was pleased that Lloyd Huger had been so helpful with med adjustments.  In addition to her hs meds, relaxation techniques were reviewed as she had expressed some doubt that she would be able to sleep tonight. Encouraged positive thinking and relaxation.  Denied thoughts of SI/HI. A)  Will continue to monitor for safety, support, continue POC R)  Receptive, appreciative, safety maintained.

## 2013-03-28 NOTE — Progress Notes (Signed)
Adult Psychoeducational Group Note  Date:  03/28/2013 Time:  1315 Group Topic/Focus:  Self Care:   The focus of this group is to help patients understand the importance of self-care in order to improve or restore emotional, physical, spiritual, interpersonal, and financial health.  Participation Level:  Active  Participation Quality:  Appropriate, Attentive and Sharing  Affect:  Flat  Cognitive:  Alert and Appropriate  Insight: Appropriate  Engagement in Group:  Engaged  Modes of Intervention:  Activity, Clarification, Discussion, Education and Support  Additional Comments:  Pt appeared to understand the importance of nurturing oneself physically, emotionally, intellectually, spiritually, and socially.  She shared that she has many things she can do with her boyfriend and daughter but due to her depression, she doesn't want to.  Pt stated that she is committed to being more active beginning today.  Pt plans to walk, go [place], be in nature more. Pt appeared vested in her treatment.  Gwyndolyn Kaufman 03/28/2013, 10:28 AM

## 2013-03-28 NOTE — Progress Notes (Signed)
D:  Per pt self inventory pt reports sleeping is poor , appetite is good, energy level, rates depression at a 0, rates hopelessness at a 0, denies SI/HI/AVH. C/o back pain, stated pain was at level 6.  A:  Support and encouragement provided, encouraged pt to attend all groups and activities, q15 minute checks continued for safety.  R:  Pt is interacting with peers. Taking meds as ordered, reports some relieve with the flexeril.

## 2013-03-28 NOTE — Progress Notes (Signed)
Patient ID: Erin [name], female   DOB: 1971/06/02, 42 y.o.   MRN: [id number] [facility] MD Progress Note  03/28/2013 3:36 PM Lashena Signer  MRN:  [id number] Subjective: [name] reports that her depression is decreasing and that she is feeling more like herself. Patient talked about having several of her medications such as valium and fiorcet decreased yesterday. Patient stated "At first I panicked, but now I see part of the problem might have been that I was overmedicated. I was in a fog sometimes." Patient remains anxious about not sleeping well but feels she may do better at home. She plans on taking trazodone with the remeron tonight. Rates depression at one but reports that it was ten on admission.   Diagnosis:  Major depressive disorder severe recurrent  ADL's:  Intact  Sleep: Fair  Appetite:  Fair  Suicidal Ideation:  denies Homicidal Ideation:  denies AEB (as evidenced by): patient 's report of improved symptoms.  Psychiatric Specialty Exam: Review of Systems  Constitutional: Negative.  Negative for fever, chills, weight loss, malaise/fatigue and diaphoresis.  HENT: Negative for congestion and sore throat.   Eyes: Negative for blurred vision, double vision and photophobia.  Respiratory: Negative for cough, shortness of breath and wheezing.   Cardiovascular: Negative for chest pain, palpitations and PND.  Gastrointestinal: Negative for heartburn, nausea, vomiting, abdominal pain, diarrhea and constipation.  Musculoskeletal: Positive for back pain. Negative for myalgias, joint pain and falls.  Neurological: Positive for headaches. Negative for dizziness, tingling, tremors, sensory change, speech change, focal weakness, seizures, loss of consciousness and weakness.  Endo/Heme/Allergies: Negative for polydipsia. Does not bruise/bleed easily.  Psychiatric/Behavioral: Negative for depression, suicidal ideas, hallucinations, memory loss and substance abuse. The patient is nervous/anxious. The  patient does not have insomnia.     Blood pressure 126/93, pulse 142, temperature 98.1 F (36.7 C), temperature source Oral, resp. rate 20, height 5\' 9"  (1.753 m), weight 66.679 kg (147 lb).Body mass index is 21.7 kg/(m^2).  General Appearance: Casual  Eye Contact::  Good  Speech:  Clear and Coherent  Volume:  Normal  Mood:  Anxious and Depressed  Affect:  Congruent  Thought Process:  Goal Directed  Orientation:  Full (Time, Place, and Person)  Thought Content:  WDL  Suicidal Thoughts:  No  Homicidal Thoughts:  No  Memory:  Immediate;   Fair  Judgement:  Fair  Insight:  Present  Psychomotor Activity:  Restlessness  Concentration:  Poor  Recall:  Fair  Akathisia:  No  Handed:  Right  AIMS (if indicated):     Assets:  Communication Skills Desire for Improvement Housing Physical Health Social Support Talents/Skills Vocational/Educational  Sleep:  Number of Hours: 4   Current Medications: Current Facility-Administered Medications  Medication Dose Route Frequency Provider Last Rate Last Dose  . acetaminophen (TYLENOL) tablet 650 mg  650 mg Oral Q6H PRN Kerry Hough, PA-C   650 mg at 03/28/13 0410  . albuterol (PROVENTIL HFA;VENTOLIN HFA) 108 (90 BASE) MCG/ACT inhaler 2 puff  2 puff Inhalation Q6H PRN Kerry Hough, PA-C      . alum & mag hydroxide-simeth (MAALOX/MYLANTA) 200-200-20 MG/5ML suspension 30 mL  30 mL Oral Q4H PRN Kerry Hough, PA-C      . celecoxib (CELEBREX) capsule 100 mg  100 mg Oral BID Verne Spurr, PA-C   100 mg at 03/28/13 2952  . cloNIDine (CATAPRES) tablet 0.1 mg  0.1 mg Oral BID Verne Spurr, PA-C   0.1 mg at 03/28/13 8413  . cyclobenzaprine (  FLEXERIL) tablet 10 mg  10 mg Oral TID PRN Kerry Hough, PA-C   10 mg at 03/28/13 0636  . diazepam (VALIUM) tablet 5 mg  5 mg Oral Q12H PRN Verne Spurr, PA-C   5 mg at 03/28/13 0409  . gabapentin (NEURONTIN) capsule 600 mg  600 mg Oral TID Kerry Hough, PA-C   600 mg at 03/28/13 1210  . hydrOXYzine  (ATARAX/VISTARIL) tablet 50 mg  50 mg Oral Q6H PRN Nehemiah Settle, MD   50 mg at 03/27/13 0933  . ibuprofen (ADVIL,MOTRIN) tablet 600 mg  600 mg Oral Q8H PRN Kerry Hough, PA-C   600 mg at 03/28/13 0636  . lurasidone (LATUDA) tablet 40 mg  40 mg Oral Q breakfast Nehemiah Settle, MD   40 mg at 03/28/13 0823  . magnesium hydroxide (MILK OF MAGNESIA) suspension 30 mL  30 mL Oral Daily PRN Kerry Hough, PA-C      . mirtazapine (REMERON) tablet 15 mg  15 mg Oral QHS Fransisca Kaufmann, NP   15 mg at 03/27/13 2259  . nicotine (NICODERM CQ - dosed in mg/24 hours) patch 21 mg  21 mg Transdermal Daily Nehemiah Settle, MD   21 mg at 03/28/13 1610  . traZODone (DESYREL) tablet 50 mg  50 mg Oral QHS PRN Fransisca Kaufmann, NP   50 mg at 03/26/13 2238    Lab Results:  Results for orders placed during the hospital encounter of 03/24/13 (from the past 48 hour(s))  TSH     Status: None   Collection Time    03/27/13  7:40 PM      Result Value Range   TSH 0.470  0.350 - 4.500 uIU/mL  VITAMIN D 25 HYDROXY     Status: Abnormal   Collection Time    03/27/13  7:40 PM      Result Value Range   Vit D, 25-Hydroxy 28 (*) 30 - 89 ng/mL   Comment: (NOTE)     This assay accurately quantifies Vitamin D, which is the sum of the     25-Hydroxy forms of Vitamin D2 and D3.  Studies have shown that the     optimum concentration of 25-Hydroxy Vitamin D is 30 ng/mL or higher.      Concentrations of Vitamin D between 20 and 29 ng/mL are considered to     be insufficient and concentrations less than 20 ng/mL are considered     to be deficient for Vitamin D.    Physical Findings: AIMS: Facial and Oral Movements Muscles of Facial Expression: None, normal Lips and Perioral Area: None, normal Jaw: None, normal Tongue: None, normal,Extremity Movements Upper (arms, wrists, hands, fingers): None, normal Lower (legs, knees, ankles, toes): None, normal, Trunk Movements Neck, shoulders, hips: None,  normal, Overall Severity Severity of abnormal movements (highest score from questions above): None, normal Incapacitation due to abnormal movements: None, normal Patient's awareness of abnormal movements (rate only patient's report): No Awareness, Dental Status Current problems with teeth and/or dentures?: No Does patient usually wear dentures?: No  CIWA:  CIWA-Ar Total: 1 COWS:  COWS Total Score: 2  Treatment Plan Summary: Daily contact with patient to assess and evaluate symptoms and progress in treatment Medication management  Plan: Continue crisis management and stabilization.  Medication management: Reviewed with patient who reports no untoward effects. Denies symptoms of opiate withdrawal.  Encouraged patient to attend groups and participate in group counseling sessions and activities.  Discharge plan in progress.  Address health issues: Vitals reviewed and stable.  Continue current treatment plan.   Medical Decision Making Problem Points:  Established problem, stable/improving (1) and New problem, with no additional work-up planned (3) Data Points:  Review of medication regiment & side effects (2) Review of new medications or change in dosage (2)  I certify that inpatient services furnished can reasonably be expected to improve the patient's condition.  Fransisca Kaufmann NP-C 3:36 PM 03/28/2013

## 2013-03-28 NOTE — [facility] Group Notes (Addendum)
[facility] LCSW Group Therapy  03/28/2013   Type of Therapy:  Group Therapy 3 to 4 PM  Participation Level:  Active  Participation Quality:  Appropriate, Attentive and Sharing  Affect:  Appropriate  Cognitive:  Appropriate  Insight:  Developing/Improving  Engagement in Therapy:  Engaged  Modes of Intervention:  Discussion, Exploration, Rapport Building, Socialization and Support  Summary of Progress/Problems: Topic for group today was readiness for change and the different stages ( Pre contemplation, Contemplation, Preparation,  Action, Maintenance and reoccurrence) were then discussed and described by group.  [name] was able to share that she feels prepared and ready to take action.  She was also able to process need to develop a plan with boyfriend as to how to handle an everyday visitor who is not a healthy support.  After stating all the reasons that she could not state boundaries with the person and would just attempt to avoid she agreed she could start by writing a letter stating need for him to not visit,  Harrill, Julious Payer

## 2013-03-28 NOTE — Progress Notes (Signed)
Psychoeducational Group Note  Date: 03/28/2013 Time:  1015  Group Topic/Focus:  Identifying Needs:   The focus of this group is to help patients identify their personal needs that have been historically problematic and identify healthy behaviors to address their needs.  Participation Level:  Active  Participation Quality:  Appropriate  Affect:  Appropriate  Cognitive:  Appropriate  Insight:  Improving  Engagement in Group:  Engaged  Additional Comments:  Found the group to be useful and that it will be applied to daily life.   Makaelyn Aponte A 

## 2013-03-29 NOTE — [facility] Group Notes (Signed)
[facility] LCSW Group Therapy  03/29/2013   3:00 PM   Type of Therapy:  Group Therapy  Participation Level:  Active  Participation Quality:  Appropriate and Attentive  Affect:  Appropriate  Cognitive:  Alert and Appropriate  Insight:  Developing/Improving and Engaged  Engagement in Therapy:  Developing/Improving and Engaged  Modes of Intervention:  Clarification, Confrontation, Discussion, Education, Exploration, Limit-setting, Orientation, Problem-solving, Rapport Building, Dance movement psychotherapist, Socialization and Support  Summary of Progress/Problems: The main focus of today's process group was to identify the patient's current support system and decide on other supports that can be put in place.  An emphasis was placed on using counselor, doctor, therapy groups, 12-step groups, and problem-specific support groups to expand supports, as well as doing something different than has been done before.  Pt shared that it is important to know who your supports are before crisis to know who to reach out to.  Pt actively participated and was engaged in group discussion.    Reyes Ivan, LCSWA 03/29/2013 1:54 PM

## 2013-03-29 NOTE — Progress Notes (Signed)
Psychoeducational Group Note  Date:  03/29/2013 Time:  1015  Group Topic/Focus:  Making Healthy Choices:   The focus of this group is to help patients identify negative/unhealthy choices they were using prior to admission and identify positive/healthier coping strategies to replace them upon discharge.  Participation Level:  Active  Participation Quality:  Appropriate  Affect:  Appropriate  Cognitive:  Oriented  Insight:  Engaged and Improving  Engagement in Group:  Engaged  Additional Comments:    Gaspar Fowle A 03/29/2013 

## 2013-03-29 NOTE — Progress Notes (Signed)
Psychoeducational Group Note  Psychoeducational Group Note  Date: 03/29/2013 Time:  03/29/2013  Group Topic/Focus:  Gratefulness:  The focus of this group is to help patients identify what two things they are most grateful for in their lives. What helps ground them and to center them on their work to their recovery.  Participation Level:  Active  Participation Quality:  Appropriate  Affect:  Angry and Appropriate  Cognitive:  Appropriate  Insight:  Improving  Engagement in Group:  Engaged  Additional Comments:  Pt participated fully in group. States her gratefulness is for her Mom who is the heart of her family and for the opportunity to be here at [facility] [place] [facility]

## 2013-03-30 MED ORDER — TRAZODONE HCL 50 MG PO TABS: 50.0000 mg | ORAL_TABLET | Freq: Every evening | ORAL | Status: DC | PRN

## 2013-03-30 MED ORDER — LURASIDONE HCL 40 MG PO TABS: 40.0000 mg | ORAL_TABLET | Freq: Every day | ORAL | Status: DC

## 2013-03-30 MED ORDER — CELECOXIB 100 MG PO CAPS: 100.0000 mg | ORAL_CAPSULE | Freq: Two times a day (BID) | ORAL | Status: DC

## 2013-03-30 MED ORDER — GABAPENTIN 600 MG PO TABS: 600.0000 mg | ORAL_TABLET | Freq: Three times a day (TID) | ORAL | Status: DC

## 2013-03-30 MED ORDER — MIRTAZAPINE 15 MG PO TABS: 15.0000 mg | ORAL_TABLET | Freq: Every day | ORAL | Status: DC

## 2013-03-30 NOTE — Discharge Summary (Signed)
Physician Discharge Summary Note  Patient:  Erin [name] is an 42 y.o., female MRN:  [id number] DOB:  15-Jan-1971 Patient phone:  [phone] (home)  Patient address:   [address] [place] [address],   Date of Admission:  03/24/2013 Date of Discharge: 03/30/13  Reason for Admission:  Bipolar Depression with SI  Discharge Diagnoses: Active Problems:   * No active hospital problems. *  Review of Systems  Constitutional: Negative.   HENT: Negative.   Eyes: Negative.   Respiratory: Negative.   Cardiovascular: Negative.   Gastrointestinal: Negative.   Genitourinary: Negative.   Musculoskeletal: Negative.   Skin: Negative.   Neurological: Negative.   Endo/Heme/Allergies: Negative.   Psychiatric/Behavioral: Negative for depression, suicidal ideas, hallucinations, memory loss and substance abuse. The patient is not nervous/anxious and does not have insomnia.    Axis Diagnosis:   AXIS I:  Bipolar, mixed, Substance Abuse and Substance Induced Mood Disorder AXIS II:  Cluster B Traits AXIS III:   Past Medical History  Diagnosis Date  . Back pain, chronic   . Seizures     pt had 1 seizure October 2012- no other history of seizures  . Chronic back pain   . Depression    AXIS IV:  economic problems, occupational problems, other psychosocial or environmental problems, problems related to social environment and problems with access to health care services AXIS V:  61-70 mild symptoms  Level of Care:  OP  Hospital Course:  Erin [name] is admitted emergently and voluntarily at [facility] for bipolar disorder with a mix of symptoms and polysubstance abuse with suicidal ideation. Patient has a history of multiple suicide attempts with overdose on her medications and also has a family history of completed suicide attempts in her father and uncles. Her previous suicide attempt was in 2008 which was triggered by an argument with her boyfriend Patient Has been medically cleared at  [facility] long emergency department. Her urine drug screen positive for barbiturates and benzodiazepines. Patient was diagnosed with bipolar disorder in 2006. Patient Stated That she called her mother requesting help because of becoming emotionally drained, crying, sad, unhappy,hopeless, fatigue, guilt, tearful, isolating, loss of interest in usual pleasures, feeling worthless, self pity, angry, irritable and insomnia and has thoughts of ending her life x one week, "Patient stated she does not want to be here any longer". Patient complaining about feeling anxiety and need of medication management. Patient is accompanied by her mother and sister to the hospital. Patient has minimal withdrawal symptoms to include anxiety, tingling, agitation, tachycardia, sweats. Patient Lives with her 65 yo daughter and her boyfriend x14 years.  The duration of stay was six days.The patient was seen and evaluated by the Treatment team consisting of Psychiatrist, NP-C, RN, Case Manager, and Therapist for evaluation and treatment plan with goal of stabilization upon discharge. The patient's physical and mental health problems were identified and treated appropriately.  Multiple modalities of treatment were used including medication, individual and group therapies, unit programming, improved nutrition, physical activity, and family sessions as needed. Patient had several medication adjustments during her hospital course. Patient requested to be tried on Latuda and reported that previously being on Brintellix had not been helpful. She was started on Latuda 40 mg to help with her current depression. She complained of having trouble sleeping and was started on Remeron 15 mg along with Trazodone 50 mg hs prn as a secondary backup. The patient was very fixated each day on not being able to sleep that  night. She was advised to improve her sleep hygiene and to work on not focusing on not being able to sleep at night. The patient had become  very dependent per her report on valium stating "I kept it in my purse so it was always handy. A lady here stopped that and my ultram. At first I panicked but now I think they could have been actually making me worse." She was started on Celebrex 100 mg bid to help with symptoms of osteoarthritis. Patient also experienced some symptoms of opiate withdrawal and was placed on scheduled clonidine twice daily. Patient tolerated this well despite having reported an allergy to clonidine feeling like "I think the clonidine patch in the past I had was just too much and it made me feel bad."   The symptoms of depression were monitored daily by evaluation by clinical provider.  The patient's mental and emotional status was evaluated by a daily self inventory completed by the patient.Improvement was demonstrated by declining numbers on the self assessment, improving vital signs, increased cognition, and improvement in mood, sleep, appetite as well as a reduction in physical symptoms.   The patient was evaluated and found to be stable enough for discharge and was released to home per the initial plan of treatment. Erin [name] spoke to the entire treatment team prior to her discharge and denied any SI. Patient stated "I feel ready to take care of my 92 year old daughter. I feel better than I have in months." Rated her depression at zero. Patient appeared calm and verbalized readiness to discharge. Patient was given prescriptions for her medications that were started in the hospital along with a medication supply. The patient was picked up by her mother.   Mental Status Exam:  For mental status exam please see mental status exam and  suicide risk assessment completed by attending physician prior to discharge.  Consults:  None  Significant Diagnostic Studies:  labs: CBC, Chem profile, Vitamin D level  Discharge Vitals:   Blood pressure 129/88, pulse 75, temperature 97.7 F (36.5 C), temperature source Oral, resp. rate 18,  height 5\' 9"  (1.753 m), weight 66.679 kg (147 lb). Body mass index is 21.7 kg/(m^2). Lab Results:   Results for orders placed during the hospital encounter of 03/24/13 (from the past 72 hour(s))  TSH     Status: None   Collection Time    03/27/13  7:40 PM      Result Value Range   TSH 0.470  0.350 - 4.500 uIU/mL  VITAMIN D 25 HYDROXY     Status: Abnormal   Collection Time    03/27/13  7:40 PM      Result Value Range   Vit D, 25-Hydroxy 28 (*) 30 - 89 ng/mL   Comment: (NOTE)     This assay accurately quantifies Vitamin D, which is the sum of the     25-Hydroxy forms of Vitamin D2 and D3.  Studies have shown that the     optimum concentration of 25-Hydroxy Vitamin D is 30 ng/mL or higher.      Concentrations of Vitamin D between 20 and 29 ng/mL are considered to     be insufficient and concentrations less than 20 ng/mL are considered     to be deficient for Vitamin D.    Physical Findings: AIMS: Facial and Oral Movements Muscles of Facial Expression: None, normal Lips and Perioral Area: None, normal Jaw: None, normal Tongue: None, normal,Extremity Movements Upper (arms, wrists, hands, fingers): None,  normal Lower (legs, knees, ankles, toes): None, normal, Trunk Movements Neck, shoulders, hips: None, normal, Overall Severity Severity of abnormal movements (highest score from questions above): None, normal Incapacitation due to abnormal movements: None, normal Patient's awareness of abnormal movements (rate only patient's report): No Awareness, Dental Status Current problems with teeth and/or dentures?: No Does patient usually wear dentures?: No  CIWA:  CIWA-Ar Total: 1 COWS:  COWS Total Score: 2  Psychiatric Specialty Exam: See Psychiatric Specialty Exam and Suicide Risk Assessment completed by Attending Physician prior to discharge.  Discharge destination:  Home  Is patient on multiple antipsychotic therapies at discharge:  No   Has Patient had three or more failed trials  of antipsychotic monotherapy by history:  No  Recommended Plan for Multiple Antipsychotic Therapies: N/A     Medication List    STOP taking these medications       BRINTELLIX 20 MG Tabs  Generic drug:  Vortioxetine HBr     butalbital-acetaminophen-caffeine 50-325-40 MG per tablet  Commonly known as:  FIORICET, ESGIC     cyclobenzaprine 10 MG tablet  Commonly known as:  FLEXERIL     diazepam 10 MG tablet  Commonly known as:  VALIUM     ibuprofen 600 MG tablet  Commonly known as:  ADVIL,MOTRIN     progesterone 200 MG capsule  Commonly known as:  PROMETRIUM     temazepam 15 MG capsule  Commonly known as:  RESTORIL     traMADol 50 MG tablet  Commonly known as:  ULTRAM      TAKE these medications     Indication   albuterol 108 (90 BASE) MCG/ACT inhaler  Commonly known as:  PROVENTIL HFA;VENTOLIN HFA  Inhale 2 puffs into the lungs every 6 (six) hours as needed for wheezing or shortness of breath.      celecoxib 100 MG capsule  Commonly known as:  CELEBREX  Take 1 capsule (100 mg total) by mouth 2 (two) times daily.   Indication:  Joint Damage causing Pain and Loss of Function     cyanocobalamin 1000 MCG/ML injection  Commonly known as:  (VITAMIN B-12)  Inject 1,000 mcg into the muscle every 30 (thirty) days.      gabapentin 600 MG tablet  Commonly known as:  NEURONTIN  Take 1 tablet (600 mg total) by mouth 3 (three) times daily. For anxiety and back pain.   Indication:  Nerve Pain, Pain     lurasidone 40 MG Tabs  Commonly known as:  LATUDA  Take 1 tablet (40 mg total) by mouth daily with breakfast.   Indication:  Depressive Phase of Manic-Depression     mirtazapine 15 MG tablet  Commonly known as:  REMERON  Take 1 tablet (15 mg total) by mouth at bedtime.   Indication:  Major Depressive Disorder     traZODone 50 MG tablet  Commonly known as:  DESYREL  Take 1 tablet (50 mg total) by mouth at bedtime as needed for sleep.   Indication:  Trouble Sleeping            Follow-up Information   Follow up with [facility] On 04/03/2013. (Appointment scheduled at 2:00 pm with Rudi Rummage for therapy)    Contact information:   [address], [place] [address] Phone: [phone] Fax: [phone]      Follow up with [facility] On 04/28/2013. (Appointment scheduled at 1:10 pm with Dr. Jannifer Franklin for medication management)    Contact information:   [address] [place], [place] [address] Phone: [phone] Fax: [phone]      Follow-up recommendations:  Activity:  As tolerated  Diet:  Regular  Comments:   Take all your medications as prescribed by your mental healthcare provider.  Report any adverse effects and or reactions from your medicines to your outpatient provider promptly.  Patient is instructed and cautioned to not engage in alcohol and or illegal drug use while on prescription medicines.  In the event of worsening symptoms, patient is instructed to call the crisis hotline, 911 and or go to the nearest ED for appropriate evaluation and treatment of symptoms.  Follow-up with your primary care provider for your other medical issues, concerns and or health care needs.   Total Discharge Time:  Greater than 30 minutes.  SignedFransisca Kaufmann NP-C 03/30/2013, 10:49 AM  Patient was personally seen, case discussed in detail with physician extender. Reviewed the information documented and agree with the discharge treatment plan.  Nehemiah Settle., MD 03/30/2013 7:14 PM

## 2013-03-30 NOTE — Progress Notes (Signed)
Patient ID: Erin [name], female   DOB: 1971/08/06, 42 y.o.   MRN: [id number] Patient is discharged ambulatory to ride home with her mother.  She denies SI/HI.  She verbalizes understanding of her d/c meds and followup.  She was given 2 week supply of meds and scripts.  She verbalizes appreciation  for the program and help given.

## 2013-03-30 NOTE — Progress Notes (Signed)
Adult Psychoeducational Group Note  Date: 03/29/13 Time:20:00  Group Topic/Focus:  Wrap-Up Group:   The focus of this group is to help patients review their daily goal of treatment and discuss progress on daily workbooks.  Participation Level:  Active  Participation Quality:  Sharing  Affect:  Appropriate  Cognitive:  Appropriate  Insight: Good  Engagement in Group:  Supportive  Modes of Intervention:  Discussion  Additional Comments:  Patient goal was to try to go home but doctor told patient maybe tomorrow, and patient was understanding with the doctors request.  Casilda Carls 03/30/2013, 3:18 AM

## 2013-03-30 NOTE — Tx Team (Signed)
Interdisciplinary Treatment Plan Update (Adult)  Date: 03/30/2013  Time Reviewed:  9:45 AM  Progress in Treatment: Attending groups: Yes Participating in groups:  Yes Taking medication as prescribed:  Yes Tolerating medication:  Yes Family/Significant othe contact made: Yes Patient understands diagnosis:  Yes Discussing patient identified problems/goals with staff:  Yes Medical problems stabilized or resolved:  Yes Denies suicidal/homicidal ideation: Yes Issues/concerns per patient self-inventory:  Yes Other:  New problem(s) identified: N/A  Discharge Plan or Barriers: Pt will follow up at [facility] for medication management and therapy.    Reason for Continuation of Hospitalization: Stable to d/c today  Comments: N/A  Estimated length of stay: D/C today  For review of initial/current patient goals, please see plan of care.  Attendees: Patient:  Erin [name] 03/30/2013 10:14 AM   Family:     Physician:  Dr. Javier Glazier 03/30/2013 10:13 AM   Nursing:  Dellia Cloud, RN 03/30/2013 10:13 AM   Clinical Social Worker:  Reyes Ivan, LCSWA 03/30/2013 10:13 AM   Other: Fransisca Kaufmann, NP 03/30/2013 10:13 AM   Other:  Frankey Shown, MA care coordination 03/30/2013 10:13 AM   Other:  Juline Patch, LCSW 03/30/2013 10:13 AM   Other:  Quintella Reichert, RN 03/30/2013 10:09 AM  Other: Liborio Nixon, RN 03/30/2013 10:14 AM   Other:    Other:    Other:    Other:    Other:     Scribe for Treatment Team:   Carmina Miller, 03/30/2013 10:13 AM

## 2013-03-30 NOTE — Progress Notes (Signed)
Grief and Loss Group  ° °Group members processed their feelings and experiences with significant losses in their lives. °  °Pt left the group ten minutes after group started.  ° °[name]  °Counselor Intern  °[employer] °

## 2013-03-30 NOTE — Progress Notes (Signed)
[facility] Adult Case Management Discharge Plan :  Will you be returning to the same living situation after discharge: Yes,  returning home At discharge, do you have transportation home?:Yes,  family will pick pt up Do you have the ability to pay for your medications:Yes,  access to meds  Release of information consent forms completed and in the chart;  Patient's signature needed at discharge.  Patient to Follow up at: Follow-up Information   Follow up with [facility] On 04/03/2013. (Appointment scheduled at 2:00 pm with Rudi Rummage for therapy)    Contact information:   [address] [place] [address] Phone: [phone] Fax: [phone]      Follow up with [facility] On 04/28/2013. (Appointment scheduled at 1:10 pm with Dr. Jannifer Franklin for medication management)    Contact information:   [address] [place], [place] [address] Phone: [phone] Fax: [phone]      Patient denies SI/HI:   Yes,  denies SI/HI    Safety Planning and Suicide Prevention discussed:  Yes,  discussed with pt and pt's mother (see suicide prevention note)  Horton, Salome Arnt 03/30/2013, 10:27 AM

## 2013-03-30 NOTE — Progress Notes (Signed)
Patient ID: Erin [name], female   DOB: Feb 13, 1971, 42 y.o.   MRN: [id number] [facility] MD Progress Note  03/30/2013 1:03 AM Erin [name]  MRN:  [id number]  Subjective: Patient states she feels better and that she is ready to go home. She denies SI/HI she notes that her sleep is ok, but is having early morning awakening but still is getting better sleep. [name] is anxious to start her family vacation and then consider returning to work. She is getting the DUI blower on her car due to a recent DUI but doesn't see a problem being able to drive now and getting a job.  Diagnosis:  Major depressive disorder severe recurrent  ADL's:  Intact  Sleep: Fair  Appetite:  Fair  Suicidal Ideation:  denies Homicidal Ideation:  denies AEB (as evidenced by): patient 's report of improved symptoms.  Psychiatric Specialty Exam: Review of Systems  Constitutional: Negative.  Negative for fever, chills, weight loss, malaise/fatigue and diaphoresis.  HENT: Negative for congestion and sore throat.   Eyes: Negative for blurred vision, double vision and photophobia.  Respiratory: Negative for cough, shortness of breath and wheezing.   Cardiovascular: Negative for chest pain, palpitations and PND.  Gastrointestinal: Negative for heartburn, nausea, vomiting, abdominal pain, diarrhea and constipation.  Musculoskeletal: Positive for back pain. Negative for myalgias, joint pain and falls.  Neurological: Positive for headaches. Negative for dizziness, tingling, tremors, sensory change, speech change, focal weakness, seizures, loss of consciousness and weakness.  Endo/Heme/Allergies: Negative for polydipsia. Does not bruise/bleed easily.  Psychiatric/Behavioral: Negative for depression, suicidal ideas, hallucinations, memory loss and substance abuse. The patient is nervous/anxious. The patient does not have insomnia.     Blood pressure 112/73, pulse 93, temperature 98.4 F (36.9 C), temperature source Oral, resp. rate  18, height 5\' 9"  (1.753 m), weight 66.679 kg (147 lb).Body mass index is 21.7 kg/(m^2).  General Appearance: Casual  Eye Contact::  Good  Speech:  Clear and Coherent  Volume:  Normal  Mood:  Anxious and Depressed  Affect:  Congruent  Thought Process:  Goal Directed  Orientation:  Full (Time, Place, and Person)  Thought Content:  WDL  Suicidal Thoughts:  No  Homicidal Thoughts:  No  Memory:  Immediate;   Fair  Judgement:  Fair  Insight:  Present  Psychomotor Activity:  Restlessness  Concentration:  Poor  Recall:  Fair  Akathisia:  No  Handed:  Right  AIMS (if indicated):     Assets:  Communication Skills Desire for Improvement Housing Physical Health Social Support Talents/Skills Vocational/Educational  Sleep:  Number of Hours: 6   Current Medications: Current Facility-Administered Medications  Medication Dose Route Frequency Provider Last Rate Last Dose  . acetaminophen (TYLENOL) tablet 650 mg  650 mg Oral Q6H PRN Kerry Hough, PA-C   650 mg at 03/28/13 0410  . albuterol (PROVENTIL HFA;VENTOLIN HFA) 108 (90 BASE) MCG/ACT inhaler 2 puff  2 puff Inhalation Q6H PRN Kerry Hough, PA-C      . alum & mag hydroxide-simeth (MAALOX/MYLANTA) 200-200-20 MG/5ML suspension 30 mL  30 mL Oral Q4H PRN Kerry Hough, PA-C      . celecoxib (CELEBREX) capsule 100 mg  100 mg Oral BID Verne Spurr, PA-C   100 mg at 03/29/13 1836  . cloNIDine (CATAPRES) tablet 0.1 mg  0.1 mg Oral BID Verne Spurr, PA-C   0.1 mg at 03/29/13 1836  . cyclobenzaprine (FLEXERIL) tablet 10 mg  10 mg Oral TID PRN Kerry Hough, PA-C  10 mg at 03/29/13 1544  . gabapentin (NEURONTIN) capsule 600 mg  600 mg Oral TID Kerry Hough, PA-C   600 mg at 03/29/13 1836  . hydrOXYzine (ATARAX/VISTARIL) tablet 50 mg  50 mg Oral Q6H PRN Nehemiah Settle, MD   50 mg at 03/29/13 1544  . ibuprofen (ADVIL,MOTRIN) tablet 600 mg  600 mg Oral Q8H PRN Kerry Hough, PA-C   600 mg at 03/29/13 0554  . lurasidone  (LATUDA) tablet 40 mg  40 mg Oral Q breakfast Nehemiah Settle, MD   40 mg at 03/29/13 0811  . magnesium hydroxide (MILK OF MAGNESIA) suspension 30 mL  30 mL Oral Daily PRN Kerry Hough, PA-C      . mirtazapine (REMERON) tablet 15 mg  15 mg Oral QHS Fransisca Kaufmann, NP   15 mg at 03/29/13 2245  . nicotine (NICODERM CQ - dosed in mg/24 hours) patch 21 mg  21 mg Transdermal Daily Nehemiah Settle, MD   21 mg at 03/29/13 0643  . traZODone (DESYREL) tablet 50 mg  50 mg Oral QHS PRN Fransisca Kaufmann, NP   50 mg at 03/29/13 2245    Lab Results:  No results found for this or any previous visit (from the past 48 hour(s)).  Physical Findings: AIMS: Facial and Oral Movements Muscles of Facial Expression: None, normal Lips and Perioral Area: None, normal Jaw: None, normal Tongue: None, normal,Extremity Movements Upper (arms, wrists, hands, fingers): None, normal Lower (legs, knees, ankles, toes): None, normal, Trunk Movements Neck, shoulders, hips: None, normal, Overall Severity Severity of abnormal movements (highest score from questions above): None, normal Incapacitation due to abnormal movements: None, normal Patient's awareness of abnormal movements (rate only patient's report): No Awareness, Dental Status Current problems with teeth and/or dentures?: No Does patient usually wear dentures?: No  CIWA:  CIWA-Ar Total: 1 COWS:  COWS Total Score: 2  Treatment Plan Summary: Daily contact with patient to assess and evaluate symptoms and progress in treatment Medication management  Plan: Continue crisis management and stabilization.  Medication management: Reviewed with patient who reports no untoward effects. Denies symptoms of opiate withdrawal.  Encouraged patient to attend groups and participate in group counseling sessions and activities.  Discharge plan in progress.  Address health issues: Vitals reviewed and stable.  Continue current treatment plan.   Medical Decision  Making Problem Points:  Established problem, stable/improving (1) and New problem, with no additional work-up planned (3) Data Points:  Review of medication regiment & side effects (2) Review of new medications or change in dosage (2)  I certify that inpatient services furnished can reasonably be expected to improve the patient's condition.   Rona Ravens. [place] RPAC 1:09 AM 03/29/2013  Reviewed the information documented and agree with the treatment plan.  Shyam Dawson,JANARDHAHA R. 03/30/2013 6:32 PM

## 2013-03-30 NOTE — Progress Notes (Signed)
Patient ID: Erin [name], female   DOB: 07-07-1971, 42 y.o.   MRN: [id number] Has been out and about on hall this evening, pleasant, states feeling better but still not sleeping the way she does at home.  After having back surgery, she has gotten a good mattress and can't sleep on the hospital mattress as well as she does at home.  Feels the remeron and trazadone together have helped, but not comfortable. Was also given heat packs to try to help with comfort.. Attended group, interacting appropriately with staff and peers. A)  Will continue to monitor for safety, continue POC, comfort measures. R) Appreciative, safetymaintained.

## 2013-03-30 NOTE — [facility] Suicide Risk Assessment (Signed)
Suicide Risk Assessment  Discharge Assessment     Demographic Factors:  Adolescent or young adult, Caucasian, Low socioeconomic status and Unemployed  Mental Status Per Nursing Assessment::   On Admission:  NA  Current Mental Status by Physician: Mental Status Examination: Patient appeared as per his stated age, casually dressed, and fairly groomed, and maintaining good eye contact. Patient has good mood and his affect was constricted. He has normal rate, rhythm, and volume of speech. His thought process is linear and goal directed. Patient has denied suicidal, homicidal ideations, intentions or plans. Patient has no evidence of auditory or visual hallucinations, delusions, and paranoia. Patient has fair insight judgment and impulse control.  Loss Factors: Decrease in vocational status and Financial problems/change in socioeconomic status  Historical Factors: Prior suicide attempts, Family history of suicide and Impulsivity  Risk Reduction Factors:   Responsible for children under 23 years of age, Sense of responsibility to family, Religious beliefs about death, Living with another person, especially a relative, Positive social support, Positive therapeutic relationship and Positive coping skills or problem solving skills  Continued Clinical Symptoms:  Bipolar Disorder:   Depressive phase Unstable or Poor Therapeutic Relationship Previous Psychiatric Diagnoses and Treatments Medical Diagnoses and Treatments/Surgeries  Cognitive Features That Contribute To Risk:  Polarized thinking    Suicide Risk:  Minimal: No identifiable suicidal ideation.  Patients presenting with no risk factors but with morbid ruminations; may be classified as minimal risk based on the severity of the depressive symptoms  Discharge Diagnoses:   AXIS I:  Bipolar, Depressed AXIS II:  Deferred AXIS III:   Past Medical History  Diagnosis Date  . Back pain, chronic   . Seizures     pt had 1 seizure October  2012- no other history of seizures  . Chronic back pain   . Depression    AXIS IV:  economic problems, housing problems, occupational problems, other psychosocial or environmental problems and problems related to social environment AXIS V:  61-70 mild symptoms  Plan Of Care/Follow-up recommendations:  Activity:  As tolerated Diet:  Regular  Is patient on multiple antipsychotic therapies at discharge:  No   Has Patient had three or more failed trials of antipsychotic monotherapy by history:  No  Recommended Plan for Multiple Antipsychotic Therapies: Not applicable  Llewellyn Choplin,JANARDHAHA R. 03/30/2013, 1:17 PM

## 2013-03-30 NOTE — Progress Notes (Signed)
Adult Psychoeducational Group Note  Date:  03/30/2013 Time:  1:21 PM  Group Topic/Focus:  Self Care:   The focus of this group is to help patients understand the importance of self-care in order to improve or restore emotional, physical, spiritual, interpersonal, and financial health.  Participation Level:  Minimal  Participation Quality:  Appropriate and Attentive  Affect:  Appropriate  Cognitive:  Appropriate  Insight: Improving  Engagement in Group:  Developing/Improving  Modes of Intervention:  Discussion, Education, Socialization and Support  Additional Comments: [name] attended group and shared when asked to share. Patient define self-care in her own terms. Patient was asked to complete the self care assessment in workbook for daily theme, patient completed assessment and rated the areas of care of physical, psychological, emotional, spiritual, relationship care and was asked to give weaknesses and strengths in the areas. Patient also expressed ways to improve care and asked to set a goal for the areas of weaknesses.   Karleen Hampshire Brittini 03/30/2013, 1:21 PM

## 2013-04-02 NOTE — Progress Notes (Signed)
Patient Discharge Instructions:  After Visit Summary (AVS):   Faxed to:  04/02/13 Discharge Summary Note:   Faxed to:  04/02/13 Psychiatric Admission Assessment Note:   Faxed to:  04/02/13 Suicide Risk Assessment - Discharge Assessment:   Faxed to:  04/02/13 Faxed/Sent to the Next [facility] Care provider:  04/02/13 Faxed to [facility] @ [phone] Faxed to [facility] @ [phone]  Jerelene Redden, 04/02/2013, 2:06 PM

## 2013-08-13 ENCOUNTER — Emergency Department: Payer: Self-pay | Admitting: Emergency Medicine

## 2013-08-13 LAB — URINALYSIS, COMPLETE
Ketone: NEGATIVE
Protein: NEGATIVE
Specific Gravity: 1.005 (ref 1.003–1.030)
Squamous Epithelial: 7

## 2013-08-15 LAB — URINE CULTURE

## 2014-03-09 DIAGNOSIS — E785 Hyperlipidemia, unspecified: Secondary | ICD-10-CM | POA: Insufficient documentation

## 2014-03-09 DIAGNOSIS — Z720 Tobacco use: Secondary | ICD-10-CM | POA: Insufficient documentation

## 2014-03-09 DIAGNOSIS — J449 Chronic obstructive pulmonary disease, unspecified: Secondary | ICD-10-CM | POA: Insufficient documentation

## 2014-03-09 DIAGNOSIS — F410 Panic disorder [episodic paroxysmal anxiety] without agoraphobia: Secondary | ICD-10-CM | POA: Insufficient documentation

## 2014-03-09 DIAGNOSIS — F319 Bipolar disorder, unspecified: Secondary | ICD-10-CM | POA: Insufficient documentation

## 2015-01-28 NOTE — Op Note (Signed)
PATIENT NAME:  Erin [name], Erin L MR#:  [id number] DATE OF BIRTH:  03-18-1971  DATE OF PROCEDURE:  03/06/2013  PREOPERATIVE DIAGNOSIS: Symptomatic cholelithiasis.   POSTOPERATIVE DIAGNOSIS: Symptomatic cholelithiasis.   PROCEDURE PERFORMED: Laparoscopic cholecystectomy.   SURGEON: Micala Saltsman A. Fardowsa Authier, M.D.   ANESTHESIA: General.   ESTIMATED BLOOD LOSS: Minimal.   SPECIMEN: Gallbladder.   COMPLICATIONS: None.   INDICATION FOR SURGERY: As follows: Ms. Erin [name] is a pleasant 44 year old female with 1 week of epigastric pain and gallstones. Her symptoms seem consistent with biliary colic and therefore she was brought to the operating room suite for laparoscopic cholecystectomy.   DETAILS OF PROCEDURE: As follows: Informed consent was obtained. Ms. Erin [name] was brought to the operating room suite. She was induced. Endotracheal tube was placed. General anesthesia was administered. Her abdomen was prepped and draped in standard surgical fashion. A timeout was then performed correctly identifying the patient name, operative site and procedure to be performed. An infraumbilical incision was made. It was deepened down to the fascia. The fascia was incised. The peritoneum was entered. Two stay sutures were placed in her fascia. A Hassan trocar was placed in the abdomen. The abdomen was insufflated. Her gallbladder was visualized. It did not appear to be obviously inflamed. An 11 mm epigastric port was placed under direct visualization. Two 5 mm trocars were placed under her right costal margin at the midclavicular and anterior axillary line. The gallbladder was then grasped and retracted over the dome of the liver. The cystic artery and cystic duct were dissected out and critical view was obtained. The structures were clipped and ligated. The gallbladder was then taken off the gallbladder fossa. The gallbladder was then taken out through the umbilical port with an Endo Catch bag. The gallbladder fossa was  then examined and noted to be hemostatic. The abdomen was then irrigated with large amounts of normal saline. The trocars were then taken out under direct visualization. After I was sure that the gallbladder fossa was hemostatic, the infraumbilical fascia was closed with a figure-of-eight 0 Vicryl suture. The skin was then closed using interrupted deep dermal 4-0 Monocryl sutures. Steri-Strips, Telfa gauze and Tegaderm were then used to complete the dressing. The patient was then awoken, extubated and brought to the postanesthesia care unit. There were no immediate complications. Needle, sponge, and instrument counts were correct at the end of the procedure.   ____________________________ Si Raiderhristopher A. Valeria Boza, MD cal:aw D: 03/06/2013 08:58:55 ET T: 03/06/2013 11:29:27 ET JOB#: [id number]  cc: Cristal Deerhristopher A. Latarra Eagleton, MD, <Dictator> Jarvis NewcomerHRISTOPHER A Mong Neal MD ELECTRONICALLY SIGNED 03/11/2013 13:15

## 2015-01-28 NOTE — Consult Note (Signed)
PATIENT NAME:  Erin [name], Erin [name] MR#:  [id number] DATE OF BIRTH:  1971-06-14  DATE OF CONSULTATION:  03/05/2013  REFERRING PHYSICIAN:   CONSULTING PHYSICIAN:  Cristal Deer A. Talyn Dessert, MD  REASON FOR CONSULTATION: Epigastric and right upper quadrant pain x 1 week.   HISTORY OF PRESENT ILLNESS: Erin [name] is a pleasant 44 year old female, who began having epigastric right upper quadrant pain approximately 1 week ago. She was seen at her primary care doctor and had an ultrasound, which showed cholelithiasis. She was to follow up with general surgery. She says that over the last week it has just become unbearable. She also reports some lower back pain, which is not new as well as mild aching to deep palpation in the right lower quadrant. She also has some nausea and some subjective chills. No fevers, night sweats, shortness of breath, cough, chest pain, diarrhea, constipation or hematuria. Mother had gallbladder taken out as well.   PAST MEDICAL HISTORY:  1.  History of hysterectomy total for adenomyosis with oophorectomy as well.  2.  History of breast implants.  3.  History of kidney infections.  4.  History of anxiety, depression, and anemia.   HOME MEDICATIONS: As follows:  1.  Seroquel 100 mg p.o. at bedtime. 2.  Prozac 60 mg p.o. daily. 3.  Iron 3 times daily. 4.  Ibuprofen 800 mg t.i.d. 5.  Diazepam 10 mg 3 times a day p.r.n. 6.  Azithromycin 500 mg daily then 250 mg daily for 5 days.  7.  Albuterol 2 puffs p.r.n.  8.  Vicodin p.r.n. pain.   ALLERGIES: SULFA, WHICH CAUSED GI DISTRESS. VICODIN, WHICH CAUSES RASH.   SOCIAL HISTORY: Alcohol socially. Tobacco 1 pack per day.   FAMILY HISTORY: Sister with Hodgkin's. Diabetes in mother. No obvious family history of cardiac disease according to the patient.   REVIEW OF SYSTEMS: A 12-point review of systems was obtained. Pertinent positives and negatives as above.   PHYSICAL EXAMINATION:  VITAL SIGNS: Temperature 98.8, pulse 77, blood  pressure 117/79, respirations 20, 97% on room air.  GENERAL: No acute distress.   HEAD: Normocephalic, atraumatic.  EYES: No scleral icterus. No conjunctivitis.  CHEST: Lungs clear to auscultation. Moving air well.  HEART: Regular rate and rhythm. No murmurs, rubs, or gallops.  ABDOMEN: Soft. Not horribly tender in epigastrium or right lower quadrant. No peritoneal signs.  BACK: No obvious back tenderness. EXTREMITIES: Strength 5 out of 5 in all 4 extremities. Moves all extremities well.  NEUROLOGIC: Cranial nerves II through XII grossly intact. Sensation intact in all 4 extremities.   LABORATORY DATA: As follows: White cell count of 10, hemoglobin 14.8, hematocrit 41.4, platelets are 248. LFTs are normal. CMP is normal. Ultrasound shows gallbladder with stones. No pericholecystic fluid. No gallbladder wall thickening.   ASSESSMENT AND PLAN: Erin [name] is a pleasant 44 year old female with, what sounds like, symptoms associated with biliary disease, likely would benefit from laparoscopic cholecystectomy. Due to timing, I am uncertain whether we would be able to remove her gallbladder today and therefore I am making attempts to bring her back on 05/30 for a cholecystectomy in the morning. I have spoken with the patient. We will attempt to get her pain under control with p.o. pain medications and will attempt to schedule this. I have been given a  contact number for her which is [phone].  ____________________________ Si Raider. Kairon Shock, MD cal:aw D: 03/05/2013 01:37:39 ET T: 03/05/2013 09:29:47 ET JOB#: [id number]  cc: Cristal Deer A. Qiana Landgrebe, MD, <Dictator> Taquana Bartley  Dillon BjorkA Saniyyah Elster MD ELECTRONICALLY SIGNED 03/05/2013 20:39

## 2015-08-09 DIAGNOSIS — Z720 Tobacco use: Secondary | ICD-10-CM | POA: Insufficient documentation

## 2015-08-09 DIAGNOSIS — R7989 Other specified abnormal findings of blood chemistry: Secondary | ICD-10-CM | POA: Insufficient documentation

## 2015-08-09 DIAGNOSIS — J019 Acute sinusitis, unspecified: Secondary | ICD-10-CM | POA: Insufficient documentation

## 2015-08-09 DIAGNOSIS — F1010 Alcohol abuse, uncomplicated: Secondary | ICD-10-CM | POA: Insufficient documentation

## 2015-08-09 DIAGNOSIS — F1110 Opioid abuse, uncomplicated: Secondary | ICD-10-CM | POA: Insufficient documentation

## 2015-08-09 DIAGNOSIS — R748 Abnormal levels of other serum enzymes: Secondary | ICD-10-CM | POA: Insufficient documentation

## 2015-08-09 DIAGNOSIS — F39 Unspecified mood [affective] disorder: Secondary | ICD-10-CM | POA: Insufficient documentation

## 2015-08-12 DIAGNOSIS — R768 Other specified abnormal immunological findings in serum: Secondary | ICD-10-CM | POA: Insufficient documentation

## 2016-02-29 ENCOUNTER — Encounter: Payer: Self-pay | Admitting: Cardiology

## 2016-02-29 ENCOUNTER — Ambulatory Visit (INDEPENDENT_AMBULATORY_CARE_PROVIDER_SITE_OTHER): Payer: Medicaid Other | Admitting: Cardiology

## 2016-02-29 VITALS — BP 108/64 | HR 93 | Ht 69.0 in | Wt 138.5 lb

## 2016-02-29 DIAGNOSIS — R931 Abnormal findings on diagnostic imaging of heart and coronary circulation: Secondary | ICD-10-CM | POA: Diagnosis not present

## 2016-02-29 NOTE — Progress Notes (Signed)
Cardiology Office Note   Date:  02/29/2016   ID:  Erin [name], DOB 1971-07-19, MRN [id number]  Referring Doctor:  Estell HarpinVINES,DAIN, MD   Cardiologist:   Almond LintAileen Julea Hutto, MD   Reason for consultation:  Chief Complaint  Patient presents with  . other    Ref by Dr. Clint GuyLindley for abnormal Echo. Meds reviewed by the patient verbally.       History of Present Illness: Erin [name] is a 45 y.o. female who presents for Evaluation of abnormal echocardiogram. Patient had an echocardiogram done at an outside facility 12/28/2015. One-page summary shows normal chamber sizes, low normal LV systolic function reported at 78%55%. Mild diffuse hypokinesis. Mild left ventricular diastolic dysfunction (no details provided to explain this) mild TR, trace to mild MR.  This echocardiogram was requested by PCP to work up some atypical symptoms that patient was having. Mainly, patient has been having this vibrating feeling inside of her, that randomly occurs. She says that she has gone to endocrinology to have this worked up. Actually, they have asked her to go for 24 hour urine collection to find out what could be going on. She is concerned that she may have some abnormal hormone levels.  Patient denies chest pain, shortness of breath, PND, orthopnea, edema. No fever, cough, colds, abdominal pain.  Patient reports that probably 20 years ago, she was told that her heart function was around 35%. This was around the time that she was drinking alcohol heavily, using drugs. Shortly after that diagnosis, she found out she was pregnant. sHe eventually had a miscarriage.  ROS:  Please see the history of present illness. Aside from mentioned under HPI, all other systems are reviewed and negative.     Past Medical History  Diagnosis Date  . Back pain, chronic   . Seizures (HCC)     pt had 1 seizure October 2012- no other history of seizures  . Chronic back pain   . Depression   . Anxiety disorder   . Bipolar  disorder (HCC)   . Migraine   . Cellulitis   . Cervicalgia   . Insomnia   . COPD (chronic obstructive pulmonary disease) (HCC)   . Nicotine dependence   . Hyperlipidemia   . Vitamin D deficiency     Past Surgical History  Procedure Laterality Date  . Abdominal hysterectomy    . Cholecystectomy    . Cesarean section       reports that she has been smoking Cigarettes.  She has a 25 pack-year smoking history. She does not have any smokeless tobacco history on file. 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 She reports that she does not drink alcohol.   family history includes Heart failure in her sister.   Current Outpatient Prescriptions  Medication Sig Dispense Refill  . albuterol (PROVENTIL HFA;VENTOLIN HFA) 108 (90 BASE) MCG/ACT inhaler Inhale 2 puffs into the lungs every 6 (six) hours as needed for wheezing or shortness of breath.    . ARIPiprazole (ABILIFY) 15 MG tablet Take 15 mg by mouth daily.    . clonazePAM (KLONOPIN) 1 MG tablet Take 1 mg by mouth 2 (two) times daily as needed for anxiety.    . gabapentin (NEURONTIN) 600 MG tablet Take 600 mg by mouth 3 (three) times daily.    . sertraline (ZOLOFT) 100 MG tablet Take 100 mg by mouth daily.    . traZODone (DESYREL) 50 MG tablet Take 1 tablet (50  mg total) by mouth at bedtime as needed for sleep. (Patient taking differently: Take 100 mg by mouth at bedtime as needed for sleep. ) 30 tablet 0   No current facility-administered medications for this visit.    Allergies: Clonidine derivatives; Paxil; Prozac; Simvastatin; and Sulfa drugs cross reactors    PHYSICAL EXAM: VS:  BP 108/64 mmHg  Pulse 93  Ht  (1.753 m)  Wt 138 lb 8 oz (62.823 kg)  BMI 20.44 kg/m2 , Body mass index is 20.44 kg/(m^2). Wt Readings from Last 3 Encounters:  02/29/16 138 lb 8 oz (62.823 kg)  03/24/13 147 lb (66.679 kg)  03/24/13 148 lb (67.132 kg)    GENERAL:  well developed, well nourished, , not  in acute distress, Appears older than stated age HEENT: normocephalic, pink conjunctivae, anicteric sclerae, no xanthelasma, normal dentition, oropharynx clear NECK:  no neck vein engorgement, JVP normal, no hepatojugular reflux, carotid upstroke brisk and symmetric, no bruit, no thyromegaly, no lymphadenopathy LUNGS:  good respiratory effort, clear to auscultation bilaterally CV:  PMI not displaced, no thrills, no lifts, S1 and S2 within normal limits, no palpable S3 or S4, no murmurs, no rubs, no gallops ABD:  Soft, nontender, nondistended, normoactive bowel sounds, no abdominal aortic bruit, no hepatomegaly, no splenomegaly MS: nontender back, no kyphosis, no scoliosis, no joint deformities EXT:  2+ DP/PT pulses, no edema, no varicosities, no cyanosis, no clubbing SKIN: warm, nondiaphoretic, normal turgor, no ulcers NEUROPSYCH: alert, oriented to person, place, and time, sensory/motor grossly intact, normal mood, appropriate affect  Recent Labs: No results found for requested labs within last 365 days.   Lipid Panel    Component Value Date/Time   CHOL 207* 11/23/2008 0340   TRIG 242* 11/23/2008 0340     Other studies Reviewed:  EKG:  The ekg from 02/29/2016 was personally reviewed by me and it revealed sinus rhythm, 93 BPM  Additional studies/ records that were reviewed personally reviewed by me today include: None from our facility   ASSESSMENT AND PLAN:  Reported abnormal echocardiogram Based on available echo report from 12/28/2015. No clear significant abnormality. However the report is very limited in information that was provided. Recommend limited echocardiogram to evaluate LVEF, and wall motion. Patient really has no symptoms of chest pain or shortness of breath. It could be that she had the diagnosis of cardiomyopathy secondary to alcohol use/drugs/associated with pregnancy. May be that she has recovered LV systolic function. Previous echo showing EF of 35% is not  available for review.  Current medicines are reviewed at length with the patient today.  The patient does not have concerns regarding medicines.  Labs/ tests ordered today include: Orders Placed This Encounter  Procedures  . EKG 12-Lead  . ECHOCARDIOGRAM COMPLETE    I had a lengthy and detailed discussion with the patient regarding diagnoses, prognosis, diagnostic options, treatment options.   I counseled the patient on importance of lifestyle modification including heart healthy diet, regular physical activity.   Disposition:   FU with undersigned after tests When necessary  I spent at least 45 minutes with the patient today and more than 50% of the time was spent counseling the patient and coordinating care.    Signed, Almond Lint, MD  02/29/2016 4:40 PM    [facility]

## 2016-02-29 NOTE — Patient Instructions (Signed)
Medication Instructions:  No changes  Labwork: None ordered  Testing/Procedures: Your physician has requested that you have an echocardiogram. Echocardiography is a painless test that uses sound waves to create images of your heart. It provides your doctor with information about the size and shape of your heart and how well your heart's chambers and valves are working. This procedure takes approximately one hour. There are no restrictions for this procedure.   Date & Time: ______________________________________________________________   Follow-Up: We will call you with results.   Any Other Special Instructions Will Be Listed Below (If Applicable).     If you need a refill on your cardiac medications before your next appointment, please call your pharmacy.

## 2016-03-16 ENCOUNTER — Encounter: Payer: Self-pay | Admitting: *Deleted

## 2016-03-16 ENCOUNTER — Other Ambulatory Visit: Payer: Self-pay

## 2016-04-16 ENCOUNTER — Encounter: Payer: Self-pay | Admitting: Cardiology

## 2017-05-10 DIAGNOSIS — M5134 Other intervertebral disc degeneration, thoracic region: Secondary | ICD-10-CM | POA: Insufficient documentation

## 2017-07-08 ENCOUNTER — Encounter: Payer: Self-pay | Admitting: Internal Medicine

## 2017-07-08 ENCOUNTER — Encounter (INDEPENDENT_AMBULATORY_CARE_PROVIDER_SITE_OTHER): Payer: Self-pay

## 2017-07-08 ENCOUNTER — Ambulatory Visit (INDEPENDENT_AMBULATORY_CARE_PROVIDER_SITE_OTHER): Payer: Self-pay | Admitting: Internal Medicine

## 2017-07-08 VITALS — BP 118/78 | HR 81 | Temp 97.8°F | Wt 148.0 lb

## 2017-07-08 DIAGNOSIS — K219 Gastro-esophageal reflux disease without esophagitis: Secondary | ICD-10-CM

## 2017-07-08 DIAGNOSIS — M545 Low back pain, unspecified: Secondary | ICD-10-CM

## 2017-07-08 DIAGNOSIS — G43909 Migraine, unspecified, not intractable, without status migrainosus: Secondary | ICD-10-CM | POA: Insufficient documentation

## 2017-07-08 DIAGNOSIS — F1911 Other psychoactive substance abuse, in remission: Secondary | ICD-10-CM

## 2017-07-08 DIAGNOSIS — E538 Deficiency of other specified B group vitamins: Secondary | ICD-10-CM

## 2017-07-08 DIAGNOSIS — G4700 Insomnia, unspecified: Secondary | ICD-10-CM | POA: Insufficient documentation

## 2017-07-08 DIAGNOSIS — F5104 Psychophysiologic insomnia: Secondary | ICD-10-CM

## 2017-07-08 DIAGNOSIS — M549 Dorsalgia, unspecified: Secondary | ICD-10-CM

## 2017-07-08 DIAGNOSIS — F419 Anxiety disorder, unspecified: Secondary | ICD-10-CM

## 2017-07-08 DIAGNOSIS — G43C1 Periodic headache syndromes in child or adult, intractable: Secondary | ICD-10-CM

## 2017-07-08 DIAGNOSIS — G8929 Other chronic pain: Secondary | ICD-10-CM

## 2017-07-08 DIAGNOSIS — Z87898 Personal history of other specified conditions: Secondary | ICD-10-CM

## 2017-07-08 DIAGNOSIS — E7800 Pure hypercholesterolemia, unspecified: Secondary | ICD-10-CM

## 2017-07-08 DIAGNOSIS — F329 Major depressive disorder, single episode, unspecified: Secondary | ICD-10-CM

## 2017-07-08 DIAGNOSIS — E785 Hyperlipidemia, unspecified: Secondary | ICD-10-CM | POA: Insufficient documentation

## 2017-07-08 DIAGNOSIS — F32A Depression, unspecified: Secondary | ICD-10-CM

## 2017-07-08 NOTE — Assessment & Plan Note (Signed)
Continue Trazadone 

## 2017-07-08 NOTE — Assessment & Plan Note (Signed)
Chronic, following with Dr. Hyacinth Meeker She will continue Flexeril and Gabapentin

## 2017-07-08 NOTE — Assessment & Plan Note (Signed)
Will check B12 with annual exam

## 2017-07-08 NOTE — Patient Instructions (Signed)

## 2017-07-08 NOTE — Assessment & Plan Note (Signed)
Narcotics No will be given by me

## 2017-07-08 NOTE — Progress Notes (Signed)
HPI  Pt presents to the clinic today to establish care and for management of the conditions listed below. She is transferring care from [facility].   Anxiety and Depression: She is not sure what triggers this but she reports it is constant and severe. She is taking Zoloft, Lamictal, Buspar and Hydroxyzine. She reports she stopped all of her other meds 3 days ago. She was recently taken off her Ativan, and she reports her anxiety is completely out of control. She does not follow with a psychiatry. She would like a refill of her Ativan today.  Chronic Back Pain: s/p multiple back injuries after falling. She reports she is taking Gabapentin and Flexeril as prescribed. She follows with Dr. Hyacinth Meeker, note reviewed.  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 She reports she recently got released from [facility] in [place].  GERD: She is not sure what triggers this. She is not currently taking anything OTC for this.  Migraines: These occur 1 x month. The Fioricet provides good relief. She has no idea what her triggers are but think they are stress related.  HLD: She is not sure when the last time she had her cholesterol checked. She had some joint pain with statins so she is currently not taking any cholesterol lowering medications. She tries to consume a low fat diet.  Insomnia: She reports she can fall asleep but can't stay asleep. She feels like her anxiety makes this worse. She takes Trazadone at night with good relief.  Seizures: This occurred 5 years, secondary to Clonidine Patch and hypotension. She has not had any issues since that time. She is prescribed Lamictal but is not currently taking it. She does not follow with neurology.  B12 Deficiency: She takes B12 shots monthly. She is not sure that last time she had this checked.  Flu: never Tetanus: unsure Pap Smear: hysterectomy Mammogram: never Vision Screening: dentist: Diet: as needed   Past Medical  History:  Diagnosis Date  . Anxiety disorder   . Back pain, chronic   . Bipolar disorder (HCC)   . Cellulitis   . Cervicalgia   . Chicken pox   . Chronic back pain   . Depression   . Drug abuse (HCC)   . GERD (gastroesophageal reflux disease)   . Hx of migraines   . Hyperlipidemia   . Insomnia   . Migraine   . Nicotine dependence   . Seizures (HCC)    pt had 1 seizure October 2012- no other history of seizures  . Vitamin D deficiency     Current Outpatient Prescriptions  Medication Sig Dispense Refill  . busPIRone (BUSPAR) 15 MG tablet Take 15 mg by mouth 3 (three) times daily.    . butalbital-acetaminophen-caffeine (FIORICET, ESGIC) 50-325-40 MG tablet Take 1 tablet by mouth every 6 (six) hours as needed.     . Cyanocobalamin (VITAMIN B-12) 5000 MCG TBDP Take by mouth.    . cyclobenzaprine (FLEXERIL) 5 MG tablet Take 5 mg by mouth 3 (three) times daily as needed for muscle spasms.    Marland Kitchen gabapentin (NEURONTIN) 800 MG tablet Take 800 mg by mouth 3 (three) times daily.    . hydrOXYzine (ATARAX/VISTARIL) 25 MG tablet Take 25 mg by mouth every 6 (six) hours as needed.    . lamoTRIgine (LAMICTAL) 25 MG tablet Take 25 mg by mouth daily.    . sertraline (ZOLOFT) 100 MG tablet Take 200 mg by mouth daily.     . traZODone (  DESYREL) 100 MG tablet Take 100 mg by mouth at bedtime.     No current facility-administered medications for this visit.     Allergies  Allergen Reactions  . Clonidine Derivatives Other (See Comments)    The patch causes seizures  . Paxil [Paroxetine Hcl]   . Simvastatin   . Sulfa Drugs Cross Reactors Other (See Comments)    unknown    Family History  Problem Relation Age of Onset  . Diabetes Mother   . Alcohol abuse Father   . Asthma Father   . COPD Father   . Early death Father   . Heart failure Sister        from chemotherapy   . Arthritis Maternal Grandmother   . Stroke Maternal Grandmother   . Pancreatic cancer Maternal Grandfather     Social  History   Social History  . Marital status: Single    Spouse name: N/A  . Number of children: N/A  . Years of education: N/A   Occupational History  . Not on file.   Social History Main Topics  . Smoking status: Current Every Day Smoker    Packs/day: 0.50    Years: 25.00    Types: Cigarettes  . Smokeless tobacco: Never Used     Comment: currently using a vapor as well.   . Alcohol use No     Comment: Alcohol free for 7 months now.   . Drug use: No     Comment: drug free for 7 months   . Sexual activity: Not on file   Other Topics Concern  . Not on file   Social History Narrative  . No narrative on file    ROS:  Constitutional: Denies fever, malaise, fatigue, headache or abrupt weight changes.  HEENT: Denies eye pain, eye redness, ear pain, ringing in the ears, wax buildup, runny nose, nasal congestion, bloody nose, or sore throat. Respiratory: Denies difficulty breathing, shortness of breath, cough or sputum production.   Cardiovascular: Denies chest pain, chest tightness, palpitations or swelling in the hands or feet.  Gastrointestinal: Denies abdominal pain, bloating, constipation, diarrhea or blood in the stool.  GU: Denies frequency, urgency, pain with urination, blood in urine, odor or discharge. Musculoskeletal: Pt reports chronic back pain. Denies decrease in range of motion, difficulty with gait, or joint swelling.  Skin: Denies redness, rashes, lesions or ulcercations.  Neurological: Denies dizziness, difficulty with memory, difficulty with speech or problems with balance and coordination.  Psych: Pt reports history of anxiety and depression. Denies SI/HI.  No other specific complaints in a complete review of systems (except as listed in HPI above).  PE:  BP 118/78   Pulse 81   Temp 97.8 F (36.6 C) (Oral)   Wt 148 lb (67.1 kg)   SpO2 99%   BMI 21.86 kg/m   Wt Readings from Last 3 Encounters:  07/08/17 148 lb (67.1 kg)  02/29/16 138 lb 8 oz (62.8  kg)  03/24/13 148 lb (67.1 kg)    General: Appears her stated age, in NAD. Neck: Neck supple, trachea midline. No masses, lumps or thyromegaly present.  Cardiovascular: Normal rate and rhythm. S1,S2 noted.  No murmur, rubs or gallops noted.  Pulmonary/Chest: Normal effort and positive vesicular breath sounds. No respiratory distress. No wheezes, rales or ronchi noted.  Abdomen: Soft and nontender. Normal bowel sounds. Musculoskeletal:  No difficulty with gait.  Neurological: Alert and oriented.  Psychiatric: She is anxious appearing. Behavior is normal. Judgment and thought content  normal.     BMET    Component Value Date/Time   NA 141 03/24/2013 1646   NA 138 03/04/2013 1346   K 3.7 03/24/2013 1646   K 3.7 03/04/2013 1346   CL 106 03/24/2013 1646   CL 108 (H) 03/04/2013 1346   CO2 27 03/24/2013 1646   CO2 25 03/04/2013 1346   GLUCOSE 97 03/24/2013 1646   GLUCOSE 109 (H) 03/04/2013 1346   BUN 4 (L) 03/24/2013 1646   BUN 9 03/04/2013 1346   CREATININE 0.79 03/24/2013 1646   CREATININE 0.69 03/04/2013 1346   CALCIUM 9.4 03/24/2013 1646   CALCIUM 9.2 03/04/2013 1346   GFRNONAA >90 03/24/2013 1646   GFRNONAA >60 03/04/2013 1346   GFRAA >90 03/24/2013 1646   GFRAA >60 03/04/2013 1346    Lipid Panel     Component Value Date/Time   CHOL 207 (H) 11/23/2008 0340   TRIG 242 (H) 11/23/2008 0340    CBC    Component Value Date/Time   WBC 9.2 03/24/2013 1646   RBC 4.15 03/24/2013 1646   HGB 13.6 03/24/2013 1646   HGB 14.8 03/04/2013 1346   HCT 38.6 03/24/2013 1646   HCT 41.4 03/04/2013 1346   PLT 273 03/24/2013 1646   PLT 248 03/04/2013 1346   MCV 93.0 03/24/2013 1646   MCV 94 03/04/2013 1346   MCH 32.8 03/24/2013 1646   MCHC 35.2 03/24/2013 1646   RDW 12.6 03/24/2013 1646   RDW 13.5 03/04/2013 1346   LYMPHSABS 1.0 03/27/2009 0350   MONOABS 0.3 03/27/2009 0350   EOSABS 1.1 (H) 03/27/2009 0350   BASOSABS 0.0 03/27/2009 0350    Hgb A1C No results found for:  HGBA1C   Assessment and Plan:

## 2017-07-08 NOTE — Assessment & Plan Note (Signed)
Advised her to identify triggers and try to avoid them

## 2017-07-08 NOTE — Assessment & Plan Note (Signed)
Single episode, drug related She is prescribed Lamictal but reports she is not taking it

## 2017-07-08 NOTE — Assessment & Plan Note (Signed)
Stress/tension related Continue Fioricet as needed

## 2017-07-08 NOTE — Assessment & Plan Note (Signed)
Uncontrolled but she stopped her meds She has failed multiple meds and has currently stopped taking all her meds Given her history, I feel she would be better managed by psychiatry- referral placed She has an appt with [facility] tomorrow

## 2017-07-08 NOTE — Assessment & Plan Note (Signed)
Encouraged her to consume a low fat diet Will monitor for now

## 2017-07-11 ENCOUNTER — Ambulatory Visit
Payer: Self-pay | Attending: Student in an Organized Health Care Education/Training Program | Admitting: Student in an Organized Health Care Education/Training Program

## 2017-07-24 ENCOUNTER — Ambulatory Visit (INDEPENDENT_AMBULATORY_CARE_PROVIDER_SITE_OTHER): Payer: BLUE CROSS/BLUE SHIELD | Admitting: Psychiatry

## 2017-07-24 ENCOUNTER — Encounter: Payer: Self-pay | Admitting: Psychiatry

## 2017-07-24 VITALS — BP 110/78 | HR 91 | Temp 97.9°F | Wt 150.4 lb

## 2017-07-24 DIAGNOSIS — F411 Generalized anxiety disorder: Secondary | ICD-10-CM | POA: Diagnosis not present

## 2017-07-24 DIAGNOSIS — F1320 Sedative, hypnotic or anxiolytic dependence, uncomplicated: Secondary | ICD-10-CM | POA: Diagnosis not present

## 2017-07-24 DIAGNOSIS — F1021 Alcohol dependence, in remission: Secondary | ICD-10-CM | POA: Diagnosis not present

## 2017-07-24 DIAGNOSIS — M545 Low back pain, unspecified: Secondary | ICD-10-CM | POA: Insufficient documentation

## 2017-07-24 DIAGNOSIS — G8929 Other chronic pain: Secondary | ICD-10-CM | POA: Insufficient documentation

## 2017-07-24 DIAGNOSIS — F410 Panic disorder [episodic paroxysmal anxiety] without agoraphobia: Secondary | ICD-10-CM | POA: Diagnosis not present

## 2017-07-24 DIAGNOSIS — F1221 Cannabis dependence, in remission: Secondary | ICD-10-CM

## 2017-07-24 DIAGNOSIS — F1421 Cocaine dependence, in remission: Secondary | ICD-10-CM

## 2017-07-24 DIAGNOSIS — F3161 Bipolar disorder, current episode mixed, mild: Secondary | ICD-10-CM | POA: Diagnosis not present

## 2017-07-24 DIAGNOSIS — F1121 Opioid dependence, in remission: Secondary | ICD-10-CM

## 2017-07-24 MED ORDER — PROPRANOLOL HCL 10 MG PO TABS: 10.0000 mg | ORAL_TABLET | Freq: Three times a day (TID) | ORAL | 1 refills | Status: DC | PRN

## 2017-07-24 MED ORDER — CARBAMAZEPINE 200 MG PO TABS: 100.0000 mg | ORAL_TABLET | Freq: Two times a day (BID) | ORAL | 1 refills | Status: DC

## 2017-07-24 NOTE — Progress Notes (Signed)
Psychiatric Initial Adult Assessment   Patient Identification: Erin [name] MRN:  [id number] Date of Evaluation:  07/24/2017 Referral Source: Eugenio Hoes NP   Chief Complaint: " I am anxious ."   Chief Complaint    Establish Care; Stress; Anxiety     Visit Diagnosis:    ICD-10-CM   1. Bipolar disorder, current episode mixed, mild (HCC) F31.61 carbamazepine (TEGRETOL) 200 MG tablet  2. Panic attacks F41.0 propranolol (INDERAL) 10 MG tablet  3. Generalized anxiety disorder F41.1   4. 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 Opioid use disorder, moderate, in early remission (HCC) F11.21   6. Cannabis use disorder, moderate, in sustained remission (HCC) F12.21   7. Cocaine use disorder, moderate, in sustained remission (HCC) F14.21   8. Alcohol use disorder, severe, in sustained remission (HCC) F10.21     History of Present Illness:  Erin [name] is a 46 y old CF who is single , employed , lives in [place], [place] , has a hx of anxiety, mood swings as well as hx of polysubstance abuse , presented to [facility] to establish care.  Erin [name] reports that she has been having severe anxiety sx . She reports she is a Product/process development scientist, worries about everything in general. She also has anxiety attacks , physical sensation of SOB, heart racing, a feeling in her abdomen which is not good . She reports she wakes up in the AM feeling this way. She also reports that she has been trying vistaril but that makes her groggy. The only medication that works for her is ativan. She does not like xanax because it makes her mean. She currently continues to use Klonopin which she borrows from her mother who is prescribed the same for her own anxiety sx. She takes a zoloft which helps to some extent.  She reports mood swings. She has had episodes when she is hyperactive , energetic, goes on shopping sprees , does not sleep and does impulsive things like being hypersexual and so on. She tried several different medications in  the past. She recently tried a low dose of Lamictal which made her mood swings worse and her colleagues started calling her "scary [name]" since she was having severe ups and downs . She continues to have mood swings , but her anxiety sx are far worse .  She reports sleep issues - she takes trazodone and vistaril which helps.  She denies any hx of sexual of physical abuse , however her father shot self and killed himself when she was 46 y old. He was an alcoholic and may have dealt with bipolar sx too , but was untreated .That bothered her a lot however she has been able to cope with it now.  She has had several IP admissions in the past in different mental health facilities for anxiety, mood sx as well as suicide attempts.  She reports she is currently in between providers and has had a few providers in the past.  She also has chronic pain issues - is on neurontin 800 mg tid as well as flexeril. She continues to have problems with her back and is hoping to get help with the same from her provider.   Associated Signs/Symptoms: Depression Symptoms:   (Hypo) Manic Symptoms:  Impulsivity, Labiality of Mood, Anxiety Symptoms:  Excessive Worry, Panic Symptoms, Psychotic Symptoms:  denies PTSD Symptoms: Negative  Past Psychiatric History: Bipolar disorder diagnosis several years ago. She feels she may have had post partum depression but may have  been misdiagnosed. She has been on zoloft for >15 yr now. Per chart review she has had several IP admissions in the past for mood sx, suicidal ideations, polysubstance abuse and suicide attempt. She had a severe suicide attempt in 2010 - when she OD -sed on her medications , was intubated . Per chart review she has had other OD on medication per hx - however she denies suicide attempts other than the one in 2010. She has been to other providers in the past - Dr.Kaur , as well as others - she reports she does not remember all their names. She was recently  in rehabilitation at [facility] for opioid use do. She also has been to [facility] in the past for rehab.   Previous Psychotropic Medications: Yes , latuda ( depressed), trazodone( works), remeron, brintellix, seroquel, valium, prozac, zoloft, buspar, xanax( makes her mean) , abilify( shaky), zyprexa( weight gain) , klonopin, ativan , valium, librium, lamictal ( moody).  Substance Abuse History in the last 12 months:  Yes.  she calls herself a recovering addict. She states that she has tried every drug available. She has used cannabis, cocaine, opioids, cocaine , BZD ( She states she does not abuse , however she has been using her mother's klonopin since the past 1 month), Alcohol and so on. She reports she stopped alcohol, cocaine and cannabis 2 yrs or so ago. However , per chart review she was just released from [facility] for opioid use ( which she states was prescribed) .  Consequences of Substance Abuse: Medical Consequences:  admissions to IP unit Legal Consequences:  has had DUI' s in the past, lost her drivers license in the past due to the same per chart review. Withdrawal Symptoms:   Tremors seizure in the past  Past Medical History:  Past Medical History:  Diagnosis Date  . Anxiety disorder   . Back pain, chronic   . Bipolar disorder (HCC)   . Cellulitis   . Cervicalgia   . Chicken pox   . Chronic back pain   . Depression   . Drug abuse (HCC)   . GERD (gastroesophageal reflux disease)   . Hx of migraines   . Hyperlipidemia   . Insomnia   . Migraine   . Nicotine dependence   . Seizure (HCC) unknown  . Seizures (HCC)    pt had 1 seizure October 2012- no other history of seizures  . Vitamin D deficiency     Past Surgical History:  Procedure Laterality Date  . ABDOMINAL HYSTERECTOMY    . CESAREAN SECTION    . CHOLECYSTECTOMY      Family Psychiatric History: Father committed suicide when she was 46 yrs old, shot self . She states her dad  had substance abuse as well as possible bipolar do. Paternal uncle committed suicide, aunt committed suicide, cousin committed suicide . Mother has mental illness.  Family History:  Family History  Problem Relation Age of Onset  . Diabetes Mother   . Depression Mother   . Anxiety disorder Mother   . Alcohol abuse Father   . Asthma Father   . COPD Father   . Early death Father   . Bipolar disorder Father   . Heart failure Sister        from chemotherapy   . Arthritis Maternal Grandmother   . Stroke Maternal Grandmother   . Pancreatic cancer Maternal Grandfather   . Anxiety disorder Maternal Grandfather   . Depression Maternal  Grandfather   . Bipolar disorder Paternal Uncle   . Bipolar disorder Cousin     Social History:   Social History   Social History  . Marital status: Single    Spouse name: N/A  . Number of children: N/A  . Years of education: N/A   Social History Main Topics  . Smoking status: Current Every Day Smoker    Packs/day: 0.50    Years: 25.00    Types: Cigarettes  . Smokeless tobacco: Never Used     Comment: currently using a vapor as well.   . Alcohol use No     Comment: Alcohol free for 7 months now.   . Drug use: No     Comment: drug free for 7 months   . Sexual activity: Not Currently    Birth control/ protection: None   Other Topics Concern  . None   Social History Narrative  . None    Additional Social History: She was raised by both parents . Her mother and father separated when she was 6 . She however was close to her dad too growing up. She graduated HS ,got cosmetology degree , currently works as a Public librarian , is self employed. She has a boyfriend , and a 66 y old daughter. Her daughter's dad and her shares custody of the child on mutual agreement. She has had legal issues in the past for DUI'S .   Allergies:   Allergies  Allergen Reactions  . Clonidine Derivatives Other (See Comments)    The patch causes seizures  . Paxil  [Paroxetine Hcl]   . Simvastatin   . Sulfa Drugs Cross Reactors Other (See Comments)    unknown    Metabolic Disorder Labs: No results found for: HGBA1C, MPG No results found for: PROLACTIN Lab Results  Component Value Date   CHOL 207 (H) 11/23/2008   TRIG 242 (H) 11/23/2008     Current Medications: Current Outpatient Prescriptions  Medication Sig Dispense Refill  . albuterol (PROVENTIL HFA;VENTOLIN HFA) 108 (90 Base) MCG/ACT inhaler Inhale into the lungs.    . butalbital-acetaminophen-caffeine (FIORICET, ESGIC) 50-325-40 MG tablet Take 1 tablet by mouth every 6 (six) hours as needed.     . Cyanocobalamin (VITAMIN B-12) 5000 MCG TBDP Take by mouth.    . cyclobenzaprine (FLEXERIL) 5 MG tablet Take 5 mg by mouth 3 (three) times daily as needed for muscle spasms.    Marland Kitchen gabapentin (NEURONTIN) 800 MG tablet Take 800 mg by mouth 3 (three) times daily.    . hydrOXYzine (ATARAX/VISTARIL) 25 MG tablet Take 25 mg by mouth every 6 (six) hours as needed.    Marland Kitchen omeprazole (PRILOSEC) 20 MG capsule Take 20 mg by mouth.    . pantoprazole (PROTONIX) 40 MG tablet   1  . sertraline (ZOLOFT) 100 MG tablet Take by mouth.    . traZODone (DESYREL) 100 MG tablet Take 100 mg by mouth at bedtime.    . carbamazepine (TEGRETOL) 200 MG tablet Take 0.5 tablets (100 mg total) by mouth 2 (two) times daily. 30 tablet 1  . propranolol (INDERAL) 10 MG tablet Take 1 tablet (10 mg total) by mouth 3 (three) times daily as needed. 90 tablet 1   No current facility-administered medications for this visit.     Neurologic: Headache: No Seizure: Yes Paresthesias:No  Musculoskeletal: Strength & Muscle Tone: within normal limits Gait & Station: normal Patient leans: N/A  Psychiatric Specialty Exam: Review of Systems  Psychiatric/Behavioral: Positive for substance abuse. The  patient is nervous/anxious.   All other systems reviewed and are negative.   Blood pressure 110/78, pulse 91, temperature 97.9 F (36.6 C),  temperature source Oral, weight 150 lb 6.4 oz (68.2 kg).Body mass index is 22.21 kg/m.  General Appearance: Casual  Eye Contact:  Fair  Speech:  Normal Rate  Volume:  Normal  Mood:  Anxious  Affect:  Congruent  Thought Process:  Goal Directed and Descriptions of Associations: Circumstantial  Orientation:  Full (Time, Place, and Person)  Thought Content:  Rumination  Suicidal Thoughts:  No  Homicidal Thoughts:  No  Memory:  Immediate;   Fair Recent;   Fair Remote;   Fair  Judgement:  Fair  Insight:  Fair  Psychomotor Activity:  Normal  Concentration:  Concentration: Fair and Attention Span: Fair  Recall:  FiservFair  Fund of Knowledge:Fair  Language: Fair  Akathisia:  No  Handed:  Right  AIMS (if indicated):  NA  Assets:  Communication Skills Desire for Improvement Housing Intimacy Social Support Talents/Skills  ADL's:  Intact  Cognition: WNL  Sleep:  Stable on medications    Treatment Plan Summary:Erin [name] is a [age] y old CF who is single , employed , has a hx of anxiety, bipolar do, polysubstance abuse , continues to abuse klonopin prescribed to her mother , recently released from [facility] for opioid use . She has failed multiple medications in the past . She has had multiple admissions to IP units, has a hx of suicide attempt , family hx of suicide , mental illness as well as substance abuse. She is very preoccupied with wanting BZD for her anxiety sx today. She denies any suicidal ideations, is motivated to get treatment as well as therapy. Medication management and Plan see below  Bipolar do: Start Tegretol 100 mg po bid . She is also on Neurontin for her pain issues , which also helps with mood sx.  Anxiety sx: GAD -7 - she scored high - 20. Zoloft 150 mg po daily. Add Propranolol 10 mg po tid prn. Continue Vistaril 25 mg po daily prn.  Insomnia: Trazodone 100 mg po qhs. Vustaril 25 mg po qhs prn.  Substance abuse ( BZD)  Provided counseling. Discussed to  go to the nearest ED if she has withdrawal sx. Discussed risk of BZD use.  Provided handouts of medication information.  Refer for panic focussed therapy- With Ms.Peacock.She will see Ms.Peacock in a week .  Will order tsh, lipid panel, hba1c, pl as well as tegretol level.  Will also order EKG.  Follow up in 4 weeks .     Erin LongsSaramma Lajarvis Italiano, MD 10/17/201810:58 AM

## 2017-07-24 NOTE — Patient Instructions (Signed)
Propranolol tablets What is this medicine? PROPRANOLOL (proe PRAN oh lole) is a beta-blocker. Beta-blockers reduce the workload on the heart and help it to beat more regularly. This medicine is used to treat high blood pressure, to control irregular heart rhythms (arrhythmias) and to relieve chest pain caused by angina. It may also be helpful after a heart attack. This medicine is also used to prevent migraine headaches, relieve uncontrollable shaking (tremors), and help certain problems related to the thyroid gland and adrenal gland. This medicine may be used for other purposes; ask your health care provider or pharmacist if you have questions. COMMON BRAND NAME(S): Inderal What should I tell my health care provider before I take this medicine? They need to know if you have any of these conditions: -circulation problems or blood vessel disease -diabetes -history of heart attack or heart disease, vasospastic angina -kidney disease -liver disease -lung or breathing disease, like asthma or emphysema -pheochromocytoma -slow heart rate -thyroid disease -an unusual or allergic reaction to propranolol, other beta-blockers, medicines, foods, dyes, or preservatives -pregnant or trying to get pregnant -breast-feeding How should I use this medicine? Take this medicine by mouth with a glass of water. Follow the directions on the prescription label. Take your doses at regular intervals. Do not take your medicine more often than directed. Do not stop taking except on your the advice of your doctor or health care professional. Talk to your pediatrician regarding the use of this medicine in children. Special care may be needed. Overdosage: If you think you have taken too much of this medicine contact a poison control center or emergency room at once. NOTE: This medicine is only for you. Do not share this medicine with others. What if I miss a dose? If you miss a dose, take it as soon as you can. If it is  almost time for your next dose, take only that dose. Do not take double or extra doses. What may interact with this medicine? Do not take this medicine with any of the following medications: -feverfew -phenothiazines like chlorpromazine, mesoridazine, prochlorperazine, thioridazine This medicine may also interact with the following medications: -aluminum hydroxide gel -antipyrine -antiviral medicines for HIV or AIDS -barbiturates like phenobarbital -certain medicines for blood pressure, heart disease, irregular heart beat -cimetidine -ciprofloxacin -diazepam -fluconazole -haloperidol -isoniazid -medicines for cholesterol like cholestyramine or colestipol -medicines for mental depression -medicines for migraine headache like almotriptan, eletriptan, frovatriptan, naratriptan, rizatriptan, sumatriptan, zolmitriptan -NSAIDs, medicines for pain and inflammation, like ibuprofen or naproxen -phenytoin -rifampin -teniposide -theophylline -thyroid medicines -tolbutamide -warfarin -zileuton This list may not describe all possible interactions. Give your health care provider a list of all the medicines, herbs, non-prescription drugs, or dietary supplements you use. Also tell them if you smoke, drink alcohol, or use illegal drugs. Some items may interact with your medicine. What should I watch for while using this medicine? Visit your doctor or health care professional for regular check ups. Check your blood pressure and pulse rate regularly. Ask your health care professional what your blood pressure and pulse rate should be, and when you should contact them. You may get drowsy or dizzy. Do not drive, use machinery, or do anything that needs mental alertness until you know how this drug affects you. Do not stand or sit up quickly, especially if you are an older patient. This reduces the risk of dizzy or fainting spells. Alcohol can make you more drowsy and dizzy. Avoid alcoholic drinks. This  medicine can affect blood sugar levels. If   you have diabetes, check with your doctor or health care professional before you change your diet or the dose of your diabetic medicine. Do not treat yourself for coughs, colds, or pain while you are taking this medicine without asking your doctor or health care professional for advice. Some ingredients may increase your blood pressure. What side effects may I notice from receiving this medicine? Side effects that you should report to your doctor or health care professional as soon as possible: -allergic reactions like skin rash, itching or hives, swelling of the face, lips, or tongue -breathing problems -changes in blood sugar -cold hands or feet -difficulty sleeping, nightmares -dry peeling skin -hallucinations -muscle cramps or weakness -slow heart rate -swelling of the legs and ankles -vomiting Side effects that usually do not require medical attention (report to your doctor or health care professional if they continue or are bothersome): -change in sex drive or performance -diarrhea -dry sore eyes -hair loss -nausea -weak or tired This list may not describe all possible side effects. Call your doctor for medical advice about side effects. You may report side effects to FDA at 1-800-FDA-1088. Where should I keep my medicine? Keep out of the reach of children. Store at room temperature between 15 and 30 degrees C (59 and 86 degrees F). Protect from light. Throw away any unused medicine after the expiration date. NOTE: This sheet is a summary. It may not cover all possible information. If you have questions about this medicine, talk to your doctor, pharmacist, or health care provider.  2018 Elsevier/Gold Standard (2013-05-29 14:51:53) Carbamazepine tablets What is this medicine? CARBAMAZEPINE (kar ba MAZ e peen) is used to control seizures caused by certain types of epilepsy. This medicine is also used to treat nerve related pain. It is not  for common aches and pains. This medicine may be used for other purposes; ask your health care provider or pharmacist if you have questions. COMMON BRAND NAME(S): Epitol, Tegretol What should I tell my health care provider before I take this medicine? They need to know if you have any of these conditions: -Asian ancestry -bone marrow disease -glaucoma -heart disease or irregular heartbeat -kidney disease -liver disease -low blood counts, like low white cell, platelet, or red cell counts -porphyria -psychotic disorders -suicidal thoughts, plans, or attempt; a previous suicide attempt by you or a family member -an unusual or allergic reaction to carbamazepine, tricyclic antidepressants, phenytoin, phenobarbital or other medicines, foods, dyes, or preservatives -pregnant or trying to get pregnant -breast-feeding How should I use this medicine? Take this medicine by mouth with a glass of water. Follow the directions on the prescription label. Take this medicine with food. Take your doses at regular intervals. Do not take your medicine more often than directed. Do not stop taking this medicine except on the advice of your doctor or health care professional. A special MedGuide will be given to you by the pharmacist with each prescription and refill. Be sure to read this information carefully each time. Talk to your pediatrician regarding the use of this medicine in children. Special care may be needed. Overdosage: If you think you have taken too much of this medicine contact a poison control center or emergency room at once. NOTE: This medicine is only for you. Do not share this medicine with others. What if I miss a dose? If you miss a dose, take it as soon as you can. If it is almost time for your next dose, take only that dose. Do not  take double or extra doses. What may interact with this medicine? Do not take this medicine with any of the following medications: -certain medicines used to  treat HIV infection or AIDS that are given in combination with cobicistat - delavirdine - MAOIs like Carbex, Eldepryl, Marplan, Nardil, and Parnate - nefazodone - oxcarbazepine This medicine may also interact with the following medications: - acetaminophen - acetazolamide - barbiturate medicines for inducing sleep or treating seizures, like phenobarbital - certain antibiotics like clarithromycin, erythromycin or troleandomycin - cimetidine - cyclosporine - danazol - dicumarol - doxycycline - female hormones, including estrogens and birth control pills - grapefruit juice - isoniazid, INH - levothyroxine and other thyroid hormones - lithium and other medicines to treat mood problems or psychotic disturbances - loratadine - medicines for angina or high blood pressure - medicines for cancer - medicines for depression or anxiety - medicines for sleep - medicines to treat fungal infections, like fluconazole, itraconazole or ketoconazole - medicines used to treat HIV infection or AIDS - methadone - niacinamide - praziquantel - propoxyphene - rifampin or rifabutin - seizure or epilepsy medicine - steroid medicines such as prednisone or cortisone - theophylline - tramadol - warfarin This list may not describe all possible interactions. Give your health care provider a list of all the medicines, herbs, non-prescription drugs, or dietary supplements you use. Also tell them if you smoke, drink alcohol, or use illegal drugs. Some items may interact with your medicine. What should I watch for while using this medicine? Visit your doctor or health care professional for a regular check on your progress. Do not change brands or dosage forms of this medicine without discussing the change with your doctor or health care professional. If you are taking this medicine for epilepsy (seizures) do not stop taking it suddenly. This increases the risk of seizures. Wear  a Arboriculturist or necklace. Carry an identification card with information about your condition, medications, and doctor or health care professional. Bonita Quin may get drowsy, dizzy, or have blurred vision. Do not drive, use machinery, or do anything that needs mental alertness until you know how this medicine affects you. To reduce dizzy or fainting spells, do not sit or stand up quickly, especially if you are an older patient. Alcohol can increase drowsiness and dizziness. Avoid alcoholic drinks. Birth control pills may not work properly while you are taking this medicine. Talk to your doctor about using an extra method of birth control. This medicine can make you more sensitive to the sun. Keep out of the sun. If you cannot avoid being in the sun, wear protective clothing and use sunscreen. Do not use sun lamps or tanning beds/booths. The use of this medicine may increase the chance of suicidal thoughts or actions. Pay special attention to how you are responding while on this medicine. Any worsening of mood, or thoughts of suicide or dying should be reported to your health care professional right away. Women who become pregnant while using this medicine may enroll in the [place] Antiepileptic Drug Pregnancy Registry by calling [phone]. This registry collects information about the safety of antiepileptic drug use during pregnancy. What side effects may I notice from receiving this medicine? Side effects that you should report to your doctor or health care professional as soon as possible: -allergic reactions like skin rash, itching or hives, swelling of the face, lips, or tongue -breathing problems -changes in vision -confusion -dark urine -fast or irregular heartbeat -fever or chills, sore throat -mouth ulcers -  pain or difficulty passing urine -redness, blistering, peeling or loosening of the skin, including inside the mouth -ringing in the ears -seizures -stomach pain -swollen  joints or muscle/joint aches and pains -unusual bleeding or bruising -unusually weak or tired -vomiting -worsening of mood, thoughts or actions of suicide or dying -yellowing of the eyes or skin Side effects that usually do not require medical attention (report to your doctor or health care professional if they continue or are bothersome): -clumsiness or unsteadiness -diarrhea or constipation -headache -increased sweating -nausea This list may not describe all possible side effects. Call your doctor for medical advice about side effects. You may report side effects to FDA at 1-800-FDA-1088. Where should I keep my medicine? Keep out of reach of children. Store at room temperature below 30 degrees C (86 degrees F). Keep container tightly closed. Protect from moisture. Throw away any unused medicine after the expiration date. NOTE: This sheet is a summary. It may not cover all possible information. If you have questions about this medicine, talk to your doctor, pharmacist, or health care provider.  2018 Elsevier/Gold Standard (2014-09-16 15:38:34)

## 2017-07-26 ENCOUNTER — Ambulatory Visit: Payer: BLUE CROSS/BLUE SHIELD | Admitting: Licensed Clinical Social Worker

## 2017-08-14 ENCOUNTER — Ambulatory Visit: Payer: BLUE CROSS/BLUE SHIELD | Admitting: Licensed Clinical Social Worker

## 2017-08-21 ENCOUNTER — Ambulatory Visit: Payer: BLUE CROSS/BLUE SHIELD | Admitting: Psychiatry

## 2018-08-28 ENCOUNTER — Emergency Department
Admission: EM | Admit: 2018-08-28 | Discharge: 2018-08-28 | Disposition: A | Discharge status: Home / Self Care | Payer: BLUE CROSS/BLUE SHIELD | Attending: Emergency Medicine | Admitting: Emergency Medicine

## 2018-08-28 ENCOUNTER — Emergency Department: Payer: BLUE CROSS/BLUE SHIELD

## 2018-08-28 ENCOUNTER — Other Ambulatory Visit: Payer: Self-pay

## 2018-08-28 ENCOUNTER — Encounter: Payer: Self-pay | Admitting: Emergency Medicine

## 2018-08-28 DIAGNOSIS — Z79899 Other long term (current) drug therapy: Secondary | ICD-10-CM | POA: Diagnosis not present

## 2018-08-28 DIAGNOSIS — R05 Cough: Secondary | ICD-10-CM | POA: Diagnosis not present

## 2018-08-28 DIAGNOSIS — R112 Nausea with vomiting, unspecified: Secondary | ICD-10-CM

## 2018-08-28 DIAGNOSIS — F17210 Nicotine dependence, cigarettes, uncomplicated: Secondary | ICD-10-CM | POA: Insufficient documentation

## 2018-08-28 DIAGNOSIS — J449 Chronic obstructive pulmonary disease, unspecified: Secondary | ICD-10-CM | POA: Insufficient documentation

## 2018-08-28 DIAGNOSIS — G44021 Chronic cluster headache, intractable: Secondary | ICD-10-CM

## 2018-08-28 DIAGNOSIS — R51 Headache: Secondary | ICD-10-CM | POA: Diagnosis present

## 2018-08-28 DIAGNOSIS — R1084 Generalized abdominal pain: Secondary | ICD-10-CM | POA: Insufficient documentation

## 2018-08-28 LAB — COMPREHENSIVE METABOLIC PANEL
ALK PHOS: 105 U/L (ref 38–126)
ALT: 36 U/L (ref 0–44)
AST: 23 U/L (ref 15–41)
Albumin: 4.5 g/dL (ref 3.5–5.0)
Anion gap: 7 (ref 5–15)
BUN: 8 mg/dL (ref 6–20)
CHLORIDE: 106 mmol/L (ref 98–111)
CO2: 29 mmol/L (ref 22–32)
CREATININE: 0.58 mg/dL (ref 0.44–1.00)
Calcium: 9.5 mg/dL (ref 8.9–10.3)
GFR calc Af Amer: >60 mL/min (ref >60)
Glucose, Bld: 109 mg/dL — ABNORMAL HIGH (ref 70–99)
Potassium: 4.6 mmol/L (ref 3.5–5.1)
SODIUM: 142 mmol/L (ref 135–145)
Total Bilirubin: 0.4 mg/dL (ref 0.3–1.2)
Total Protein: 7.3 g/dL (ref 6.5–8.1)

## 2018-08-28 LAB — CBC WITH DIFFERENTIAL/PLATELET
Abs Immature Granulocytes: 0.02 10*3/uL (ref 0.00–0.07)
Basophils Absolute: 0 10*3/uL (ref 0.0–0.1)
Basophils Relative: 1 %
Eosinophils Absolute: 0.2 10*3/uL (ref 0.0–0.5)
Eosinophils Relative: 3 %
HEMATOCRIT: 41.6 % (ref 36.0–46.0)
HEMOGLOBIN: 14.1 g/dL (ref 12.0–15.0)
Immature Granulocytes: 0 %
LYMPHS ABS: 1.8 10*3/uL (ref 0.7–4.0)
LYMPHS PCT: 29 %
MCH: 32.3 pg (ref 26.0–34.0)
MCHC: 33.9 g/dL (ref 30.0–36.0)
MCV: 95.4 fL (ref 80.0–100.0)
MONO ABS: 0.5 10*3/uL (ref 0.1–1.0)
Monocytes Relative: 7 %
Neutro Abs: 3.8 10*3/uL (ref 1.7–7.7)
Neutrophils Relative %: 60 %
Platelets: 221 10*3/uL (ref 150–400)
RBC: 4.36 MIL/uL (ref 3.87–5.11)
RDW: 12.3 % (ref 11.5–15.5)
WBC: 6.3 10*3/uL (ref 4.0–10.5)
nRBC: 0 % (ref 0.0–0.2)

## 2018-08-28 LAB — URINE DRUG SCREEN, QUALITATIVE (ARMC ONLY)
AMPHETAMINES, UR SCREEN: NOT DETECTED
BENZODIAZEPINE, UR SCRN: POSITIVE — AB
Barbiturates, Ur Screen: POSITIVE — AB
Cannabinoid 50 Ng, Ur ~~LOC~~: POSITIVE — AB
Cocaine Metabolite,Ur ~~LOC~~: NOT DETECTED
MDMA (Ecstasy)Ur Screen: NOT DETECTED
METHADONE SCREEN, URINE: NOT DETECTED
OPIATE, UR SCREEN: POSITIVE — AB
Phencyclidine (PCP) Ur S: NOT DETECTED
TRICYCLIC, UR SCREEN: NOT DETECTED

## 2018-08-28 LAB — LIPASE, BLOOD: Lipase: 28 U/L (ref 11–51)

## 2018-08-28 MED ORDER — KETOROLAC TROMETHAMINE 10 MG PO TABS: 10.0000 mg | ORAL_TABLET | Freq: Three times a day (TID) | ORAL | 0 refills | Status: DC

## 2018-08-28 MED ORDER — ORPHENADRINE CITRATE 30 MG/ML IJ SOLN
60.0000 mg | Freq: Once | INTRAMUSCULAR | Status: AC
  Administered 2018-08-28: 60 mg via INTRAVENOUS
  Filled 2018-08-28: qty 2

## 2018-08-28 MED ORDER — PREDNISONE 10 MG PO TABS: 10.0000 mg | ORAL_TABLET | Freq: Two times a day (BID) | ORAL | 0 refills | Status: DC

## 2018-08-28 MED ORDER — IOPAMIDOL (ISOVUE-300) INJECTION 61%
100.0000 mL | Freq: Once | INTRAVENOUS | Status: AC | PRN
  Administered 2018-08-28: 100 mL via INTRAVENOUS
  Filled 2018-08-28: qty 100

## 2018-08-28 MED ORDER — METOCLOPRAMIDE HCL 10 MG PO TABS: 5.0000 mg | ORAL_TABLET | Freq: Three times a day (TID) | ORAL | 0 refills | Status: DC | PRN

## 2018-08-28 MED ORDER — IOPAMIDOL (ISOVUE-300) INJECTION 61%
30.0000 mL | Freq: Once | INTRAVENOUS | Status: AC | PRN
  Administered 2018-08-28: 30 mL via ORAL
  Filled 2018-08-28: qty 30

## 2018-08-28 MED ORDER — METOCLOPRAMIDE HCL 5 MG/ML IJ SOLN
20.0000 mg | Freq: Once | INTRAVENOUS | Status: AC
  Administered 2018-08-28: 20 mg via INTRAVENOUS
  Filled 2018-08-28: qty 4

## 2018-08-28 MED ORDER — KETOROLAC TROMETHAMINE 30 MG/ML IJ SOLN
30.0000 mg | Freq: Once | INTRAMUSCULAR | Status: AC
  Administered 2018-08-28: 30 mg via INTRAVENOUS
  Filled 2018-08-28: qty 1

## 2018-08-28 NOTE — ED Notes (Signed)
Patient transported to CT 

## 2018-08-28 NOTE — ED Provider Notes (Signed)
[facility] Emergency Department Provider Note ____________________________________________  Time seen: 1630  I have reviewed the triage vital signs and the nursing notes.  HISTORY  Chief Complaint  Migraine  HPI Erin [name] is a 47 y.o. female who presents herself to the ED with multiple complaints.  Her more acute complaint is that of a migraine that is been persistent for the last 2 days.  Patient gives a history of migraine/cluster headaches that have been persistent and intermittent for several years.  She reports that her typical pattern is a daily headache that she treats with daily doses of Fioricet.  She denies any recent injury, illness, fevers, chills, or sweats.  She also has a secondary complaint of nausea and vomiting without relief of her at home Phenergan and Zofran.  She describes her medications were previously prescribed for her headache syndrome, but has noted over the last month she has had near daily nausea with vomiting.  She is also had some anorexia and unintentional weight loss.  Patient would describe having to eat small meals that include grits, candy, and small portions of fruit.  She describes nonbloody nonbilious vomiting that is persistent, daily, and unresolved.  She has not seen her primary provider for the same symptoms and has had no previous work-up by GI.  She takes Protonix for reflux symptoms in addition to her nausea medicine.  She denies any food sensitivities or allergies, any history of celiac disease, IBS, colitis, or gastritis. Her medical history is consistent for anxiety, bipolar disorder, GERD, migraines, and nicotine dependence.   Past Medical History:  Diagnosis Date  . Anxiety disorder   . Back pain, chronic   . Bipolar disorder (HCC)   . Cellulitis   . Cervicalgia   . Chicken pox   . Chronic back pain   . Depression   . Drug abuse (HCC)   . GERD (gastroesophageal reflux disease)   . Hx of migraines   .  Hyperlipidemia   . Insomnia   . Migraine   . Nicotine dependence   . Seizure (HCC) unknown  . Seizures (HCC)    pt had 1 seizure October 2012- no other history of seizures  . Vitamin D deficiency     Patient Active Problem List   Diagnosis Date Noted  . Chronic lower back pain 07/24/2017  . Bipolar disorder, current episode mixed, mild (HCC) 07/24/2017  . Anxiety and depression 07/08/2017  . Chronic back pain 07/08/2017  . History of drug abuse (HCC) 07/08/2017  . GERD (gastroesophageal reflux disease) 07/08/2017  . Migraines 07/08/2017  . HLD (hyperlipidemia) 07/08/2017  . Insomnia 07/08/2017  . History of seizure 07/08/2017  . B12 deficiency 07/08/2017  . Degeneration of thoracic intervertebral disc 05/10/2017  . HCV antibody positive 08/12/2015  . Hepatitis C antibody test positive 08/12/2015  . Acute sinusitis with symptoms > 10 days 08/09/2015  . Alcohol abuse, daily use 08/09/2015  . Drug abuse, opioid type (HCC) 08/09/2015  . Elevated vitamin B12 level 08/09/2015  . Tobacco abuse 08/09/2015  . Opiate abuse, episodic (HCC) 08/09/2015  . Nondependent alcohol abuse, continuous drinking behavior 08/09/2015  . Mood disorder (HCC) 08/09/2015  . Bipolar 1 disorder, depressed (HCC) 03/09/2014  . COPD (chronic obstructive pulmonary disease) (HCC) 03/09/2014  . Dyslipidemia 03/09/2014  . Tobacco use 03/09/2014  . Panic attack 03/09/2014  . Mixed, or nondependent drug abuse 05/11/2004    Past Surgical History:  Procedure Laterality Date  . ABDOMINAL HYSTERECTOMY    .  CESAREAN SECTION    . CHOLECYSTECTOMY      Prior to Admission medications   Medication Sig Start Date End Date Taking? Authorizing Provider  albuterol (PROVENTIL HFA;VENTOLIN HFA) 108 (90 Base) MCG/ACT inhaler Inhale into the lungs. 03/09/14   [provider]  butalbital-acetaminophen-caffeine (FIORICET, ESGIC) 50-325-40 MG tablet Take 1 tablet by mouth every 6 (six) hours as needed.     [provider]  carbamazepine (TEGRETOL) 200 MG tablet Take 0.5 tablets (100 mg total) by mouth 2 (two) times daily. 07/24/17   Jomarie Longs, MD  Cyanocobalamin (VITAMIN B-12) 5000 MCG TBDP Take by mouth.    [provider]  cyclobenzaprine (FLEXERIL) 5 MG tablet Take 5 mg by mouth 3 (three) times daily as needed for muscle spasms.    [provider]  gabapentin (NEURONTIN) 800 MG tablet Take 800 mg by mouth 3 (three) times daily.    [provider]  hydrOXYzine (ATARAX/VISTARIL) 25 MG tablet Take 25 mg by mouth every 6 (six) hours as needed.    [provider]  ketorolac (TORADOL) 10 MG tablet Take 1 tablet (10 mg total) by mouth every 8 (eight) hours. 08/28/18   Kaivon Livesey, Charlesetta Ivory, PA-C  metoCLOPramide (REGLAN) 10 MG tablet Take 0.5 tablets (5 mg total) by mouth every 8 (eight) hours as needed for nausea or vomiting. 08/28/18   Shenekia Riess, Charlesetta Ivory, PA-C  omeprazole (PRILOSEC) 20 MG capsule Take 20 mg by mouth. 03/16/14   [provider]  pantoprazole (PROTONIX) 40 MG tablet  07/22/17   [provider]  predniSONE (DELTASONE) 10 MG tablet Take 1 tablet (10 mg total) by mouth 2 (two) times daily with a meal. 08/28/18   Willadean Guyton, Charlesetta Ivory, PA-C  propranolol (INDERAL) 10 MG tablet Take 1 tablet (10 mg total) by mouth 3 (three) times daily as needed. 07/24/17   Jomarie Longs, MD  sertraline (ZOLOFT) 100 MG tablet Take 150 mg by mouth.    [provider]  traZODone (DESYREL) 100 MG tablet Take 100 mg by mouth at bedtime.    [provider]    Allergies Clonidine derivatives; Paxil [paroxetine hcl]; Simvastatin; and Sulfa drugs cross reactors  Family History  Problem Relation Age of Onset  . Diabetes Mother   . Depression Mother   . Anxiety disorder Mother   . Alcohol abuse Father   . Asthma Father   . COPD Father   . Early death Father   . Bipolar disorder Father   . Heart failure Sister        from  chemotherapy   . Arthritis Maternal Grandmother   . Stroke Maternal Grandmother   . Pancreatic cancer Maternal Grandfather   . Anxiety disorder Maternal Grandfather   . Depression Maternal Grandfather   . Bipolar disorder Paternal Uncle   . Bipolar disorder Cousin     Social History Social History   Tobacco Use  . Smoking status: Current Every Day Smoker    Packs/day: 0.50    Years: 25.00    Pack years: 12.50    Types: Cigarettes  . Smokeless tobacco: Never Used  . Tobacco comment: currently using a vapor as well.   Substance Use Topics  . Alcohol use: No    Alcohol/week: 0.0 standard drinks    Comment: Alcohol free for 7 months now.   . Drug use: No    Types: Marijuana, Cocaine, Heroin, Oxycodone    Comment: drug free for 7 months  Review of Systems  Constitutional: Negative for fever. Reports unintentional weight loss Eyes: Negative for visual changes. ENT: Negative for sore throat. Cardiovascular: Negative for chest pain. Respiratory: Negative for shortness of breath. Gastrointestinal: Positive for nonspecific abdominal pain. Reports daily nausea and vomiting. Denies constipation and diarrhea. Genitourinary: Negative for dysuria. Musculoskeletal: Negative for back pain. Skin: Negative for rash. Neurological: Negative for focal weakness or numbness. Reports daily headaches, worse x 2 days ____________________________________________  PHYSICAL EXAM:  VITAL SIGNS: ED Triage Vitals  Enc Vitals Group     BP 08/28/18 1308 (!) 108/58     Pulse Rate 08/28/18 1308 (!) 101     Resp 08/28/18 1308 18     Temp 08/28/18 1308 99 F (37.2 C)     Temp Source 08/28/18 1308 Oral     SpO2 08/28/18 1308 99 %     Weight 08/28/18 1307 125 lb (56.7 kg)     Height 08/28/18 1307 5\' 9"  (1.753 m)     Head Circumference --      Peak Flow --      Pain Score 08/28/18 1307 10     Pain Loc --      Pain Edu? --      Excl. in GC? --     Constitutional: Alert and oriented. Well  appearing and in no distress. Head: Normocephalic and atraumatic. Eyes: Conjunctivae are normal. Normal extraocular movements Cardiovascular: Normal rate, regular rhythm. Normal distal pulses. Respiratory: Normal respiratory effort. No wheezes/rales/rhonchi. Gastrointestinal: Flat, soft and nontender. No distention, rebound, guarding, or rigidity.  No organomegaly noted.  Normal active bowel sounds noted. Musculoskeletal: Nontender with normal range of motion in all extremities.  Neurologic:  Normal gait without ataxia. Normal speech and language. No gross focal neurologic deficits are appreciated. Skin:  Skin is warm, dry and intact. No rash noted.  Small palpable subcutaneous cystic lesion noted to the anterior chest just below the xiphoid.  Likely representing a small lipoma. Psychiatric: Mood and affect is anxious. Patient exhibits appropriate insight and judgment. ____________________________________________   LABS (pertinent positives/negatives)  Labs Reviewed  COMPREHENSIVE METABOLIC PANEL - Abnormal; Notable for the following components:      Result Value   Glucose, Bld 109 (*)    All other components within normal limits  URINE DRUG SCREEN, QUALITATIVE ([facility] ONLY) - Abnormal; Notable for the following components:   Opiate, Ur Screen POSITIVE (*)    Cannabinoid 50 Ng, Ur [place] POSITIVE (*)    Barbiturates, Ur Screen POSITIVE (*)    Benzodiazepine, Ur Scrn POSITIVE (*)    All other components within normal limits  CBC WITH DIFFERENTIAL/PLATELET  LIPASE, BLOOD  ____________________________________________   RADIOLOGY  CT Abd/Pelvis w/ CM IMPRESSION: No acute process within the abdomen or pelvis.  CXR IMPRESSION: Negative two view chest x-ray ____________________________________________  PROCEDURES  Procedures Metoclopramide 20 mg IVPB Toradol 30 mg IVP Norflex 60 mg IVP ____________________________________________  INITIAL IMPRESSION / ASSESSMENT AND PLAN / ED  COURSE  Differential diagnosis includes, but is not limited to, ovarian cyst, ovarian torsion, acute appendicitis, diverticulitis, urinary tract infection/pyelonephritis, endometriosis, bowel obstruction, colitis, renal colic, gastroenteritis, hernia, fibroids, endometriosis, pregnancy related pain including ectopic pregnancy, etc.  Patient with ED evaluation of multiple complaints including intractable headache with a history of migraine and/or cluster headaches.  Patient reports near resolution of her symptoms at the time of discharge.  Her second complaint was some unexplained or unplanned weight loss over the last 2 months with chronic persistent nausea and vomiting.  Patient's work-up was unremarkable and CT was negative for any acute findings.  Patient is discharged with prescription for Toradol, Reglan, and prednisone to take as directed.  She is encouraged to follow-up with her primary provider for routine screening exams including colonoscopy and mammograms.  Patient is given referral information for gastroenterology as well as neurology, who will manage her nausea and anorexia and headache syndrome, respectively. ____________________________________________  FINAL CLINICAL IMPRESSION(S) / ED DIAGNOSES  Final diagnoses:  Intractable chronic cluster headache  Non-intractable vomiting with nausea, unspecified vomiting type      Asaph Serena, Charlesetta IvoryJenise V Bacon, PA-C 08/29/18 2159    Phineas SemenGoodman, [place], MD 09/01/18 1730

## 2018-08-28 NOTE — Discharge Instructions (Addendum)
Your exam, labs, x-ray, and CT scan are all essentially normal today. We have not established the exact cause of your symptoms at this time. Follow-up with GI and Neurology as discussed. Take the prescription meds as directed. Return to the ED as needed.

## 2018-08-28 NOTE — ED Notes (Signed)
Pt worried about blood pressure and asked to have rechecked before leaving. PA notified and at bedside. Recheck of blood pressure 106/68. Pt okay to be discharged.

## 2018-08-28 NOTE — ED Triage Notes (Signed)
Pt presents to ED via POV with c/o migraines, pt states hx of migraines at this time. Pt states +nausea, +emesis without relief with use of at home nausea medications. Pt also c/o nausea without migraines, c/o nausea +2 months. Pt also has knot under L breast, states significant hx of breast cancer in family and would like to be evaluated.

## 2018-08-28 NOTE — ED Notes (Signed)
Spoke with Dr. Derrill KayGoodman, see orders for details.

## 2018-09-17 ENCOUNTER — Ambulatory Visit: Payer: Self-pay | Admitting: Gastroenterology

## 2018-09-17 ENCOUNTER — Encounter

## 2018-09-17 ENCOUNTER — Ambulatory Visit: Payer: BLUE CROSS/BLUE SHIELD | Admitting: Gastroenterology

## 2018-09-17 ENCOUNTER — Other Ambulatory Visit: Payer: Self-pay

## 2018-09-17 ENCOUNTER — Encounter: Payer: Self-pay | Admitting: Gastroenterology

## 2018-09-17 VITALS — BP 120/82 | HR 76 | Ht 69.0 in | Wt 119.8 lb

## 2018-09-17 DIAGNOSIS — G8929 Other chronic pain: Secondary | ICD-10-CM | POA: Diagnosis not present

## 2018-09-17 DIAGNOSIS — R197 Diarrhea, unspecified: Secondary | ICD-10-CM | POA: Diagnosis not present

## 2018-09-17 DIAGNOSIS — R1013 Epigastric pain: Secondary | ICD-10-CM

## 2018-09-17 DIAGNOSIS — R112 Nausea with vomiting, unspecified: Secondary | ICD-10-CM

## 2018-09-17 MED ORDER — OLANZAPINE 5 MG PO TABS: 5.0000 mg | ORAL_TABLET | Freq: Every day | ORAL | 2 refills | Status: DC

## 2018-09-17 NOTE — Progress Notes (Signed)
Arlyss Repress, MD [address]  [address]  [place], [place] [address]  Main: [phone]  Fax: [phone]    Gastroenterology Consultation  Referring Provider:     Evelene Croon, MD Primary Care Physician:  Evelene Croon, MD Primary Gastroenterologist:  Dr. Arlyss Repress Reason for Consultation:     Nausea and vomiting        HPI:   JORDYNNE MCCOWN is a 47 y.o. female referred by Dr. Evelene Croon, MD  for consultation & management of chronic intractable nausea past 3months. Has intermittent vomiting, NBNB emesis, not postprandial and does not contain undigested food. A good weight for her is 133-138. Lost about 15-20lbs since 04/2018. She stresses, worry, says" I hate my job, all the time". She loves what she does, she is a Producer, television/film/video, doesn't like the place where she works as it is very busy, her work hours are very busy and have to work during weekends as well.  She was tearful in office today. She admits to having severe anxiety, wakes up sweating, anxious, heart racing.  BMs fluctuate, variable consistency, nonbloody.  She sees a psychiatrist every 3 months, currently on diazepam for anxiety, Lexapro and trazodone for sleep.  Has been taking protonix 40mg  daily, taking zofran in AM and phenergan in PM.  Patient went to ER about 2 weeks ago due to flareup of migraine and severe nausea as well as epigastric pain.  She underwent CT A/P which was unremarkable.  Her CBC, LFTs were normal, normal lipase.  Didn't fill reglan and Toradol prescribed in ER.  Patient states that she was taking Zyprexa in the past for depression which helped to gain weight but her depression was under control.  Chronic tobacco use  NSAIDs: None  Antiplts/Anticoagulants/Anti thrombotics: None  GI Procedures: None No known family history of GI malignancy in first-degree relatives  Past Medical History:  Diagnosis Date  . Anxiety disorder   . Back pain, chronic   . Bipolar disorder  (HCC)   . Cellulitis   . Cervicalgia   . Chicken pox   . Chronic back pain   . Depression   . Drug abuse (HCC)   . GERD (gastroesophageal reflux disease)   . Hx of migraines   . Hyperlipidemia   . Insomnia   . Migraine   . Nicotine dependence   . Seizure (HCC) unknown  . Seizures (HCC)    pt had 1 seizure October 2012- no other history of seizures  . Vitamin D deficiency     Past Surgical History:  Procedure Laterality Date  . ABDOMINAL HYSTERECTOMY    . CESAREAN SECTION    . CHOLECYSTECTOMY      Current Outpatient Medications:  .  albuterol (PROVENTIL HFA;VENTOLIN HFA) 108 (90 Base) MCG/ACT inhaler, Inhale into the lungs., Disp: , Rfl:  .  butalbital-acetaminophen-caffeine (FIORICET, ESGIC) 50-325-40 MG tablet, Take 1 tablet by mouth every 6 (six) hours as needed. , Disp: , Rfl:  .  Cyanocobalamin (VITAMIN B-12) 5000 MCG TBDP, Take by mouth., Disp: , Rfl:  .  cyclobenzaprine (FLEXERIL) 10 MG tablet, , Disp: , Rfl: 4 .  diazepam (VALIUM) 10 MG tablet, , Disp: , Rfl: 0 .  escitalopram (LEXAPRO) 10 MG tablet, , Disp: , Rfl: 1 .  gabapentin (NEURONTIN) 800 MG tablet, Take 800 mg by mouth 3 (three) times daily., Disp: , Rfl:  .  ondansetron (ZOFRAN) 8 MG tablet, , Disp: , Rfl: 4 .  pantoprazole (PROTONIX) 40  MG tablet, , Disp: , Rfl: 1 .  promethazine (PHENERGAN) 25 MG tablet, , Disp: , Rfl: 4 .  traZODone (DESYREL) 100 MG tablet, Take 100 mg by mouth at bedtime., Disp: , Rfl:  .  hydrOXYzine (ATARAX/VISTARIL) 25 MG tablet, Take 25 mg by mouth every 6 (six) hours as needed., Disp: , Rfl:  .  ketorolac (TORADOL) 10 MG tablet, Take 1 tablet (10 mg total) by mouth every 8 (eight) hours. (Patient not taking: Reported on 09/17/2018), Disp: 15 tablet, Rfl: 0 .  metoCLOPramide (REGLAN) 10 MG tablet, Take 0.5 tablets (5 mg total) by mouth every 8 (eight) hours as needed for nausea or vomiting. (Patient not taking: Reported on 09/17/2018), Disp: 30 tablet, Rfl: 0 .  OLANZapine (ZYPREXA) 5  MG tablet, Take 1 tablet (5 mg total) by mouth at bedtime., Disp: 30 tablet, Rfl: 2 .  predniSONE (DELTASONE) 10 MG tablet, Take 1 tablet (10 mg total) by mouth 2 (two) times daily with a meal. (Patient not taking: Reported on 09/17/2018), Disp: 10 tablet, Rfl: 0 .  propranolol (INDERAL) 10 MG tablet, Take 1 tablet (10 mg total) by mouth 3 (three) times daily as needed. (Patient not taking: Reported on 09/17/2018), Disp: 90 tablet, Rfl: 1 .  QUEtiapine (SEROQUEL) 25 MG tablet, , Disp: , Rfl: 4 .  sertraline (ZOLOFT) 100 MG tablet, Take 150 mg by mouth., Disp: , Rfl:     Family History  Problem Relation Age of Onset  . Diabetes Mother   . Depression Mother   . Anxiety disorder Mother   . Alcohol abuse Father   . Asthma Father   . COPD Father   . Early death Father   . Bipolar disorder Father   . Heart failure Sister        from chemotherapy   . Arthritis Maternal Grandmother   . Stroke Maternal Grandmother   . Pancreatic cancer Maternal Grandfather   . Anxiety disorder Maternal Grandfather   . Depression Maternal Grandfather   . Bipolar disorder Paternal Uncle   . Bipolar disorder Cousin      Social History   Tobacco Use  . Smoking status: Current Every Day Smoker    Packs/day: 0.50    Years: 25.00    Pack years: 12.50    Types: Cigarettes  . Smokeless tobacco: Never Used  . Tobacco comment: currently using a vapor as well.   Substance Use Topics  . Alcohol use: No    Alcohol/week: 0.0 standard drinks    Comment: Alcohol free for 7 months now.   . Drug use: No    Types: Marijuana, Cocaine, Heroin, Oxycodone    Comment: drug free for 7 months     Allergies as of 09/17/2018 - Review Complete 09/17/2018  Allergen Reaction Noted  . Clonidine derivatives Other (See Comments) 11/11/2011  . Paxil [paroxetine hcl]  02/29/2016  . Simvastatin  02/29/2016  . Sulfa drugs cross reactors Other (See Comments) 11/11/2011    Review of Systems:    All systems reviewed and  negative except where noted in HPI.   Physical Exam:  BP 120/82   Pulse 76   Ht 5\' 9"  (1.753 m)   Wt 119 lb 12.8 oz (54.3 kg)   BMI 17.69 kg/m  No LMP recorded. Patient has had a hysterectomy.  General:   Alert, thin built, pleasant and cooperative in NAD Head:  Normocephalic and atraumatic. Eyes:  Sclera clear, no icterus.   Conjunctiva pink. Ears:  Normal auditory acuity.  Nose:  No deformity, discharge, or lesions. Mouth:  No deformity or lesions,oropharynx pink & moist. Neck:  Supple; no masses or thyromegaly. Lungs:  Respirations even and unlabored.  Clear throughout to auscultation.   No wheezes, crackles, or rhonchi. No acute distress. Heart:  Regular rate and rhythm; no murmurs, clicks, rubs, or gallops. Abdomen:  Normal bowel sounds. Soft, mild epigastric tenderness and non-distended without masses, hepatosplenomegaly or hernias noted.  No guarding or rebound tenderness.   Rectal: Not performed Msk:  Symmetrical without gross deformities. Good, equal movement & strength bilaterally. Pulses:  Normal pulses noted. Extremities:  No clubbing or edema.  No cyanosis. Neurologic:  Alert and oriented x3;  grossly normal neurologically. Skin:  Intact without significant lesions or rashes. No jaundice. Psych:  Alert and cooperative.  Worried and depressed  Imaging Studies: Reviewed  Assessment and Plan:   Irean HongSheri L Guldin is a 47 y.o. Caucasian female with history of anxiety and depression, chronic tobacco use presents with 3 months history of intractable nausea and intermittent nonbloody nonbilious emesis  Intractable nausea with intermittent vomiting: With epigastric pain and weight loss Differentials include peptic ulcer disease or H. pylori gastritis or uncontrolled anxiety and depression or combination of the above Discussed with her about initiation of Zyprexa 5 mg at bedtime as she took it in the past and worked well for her.  She will stop trazodone.  I advised her to  contact her psychiatrist about the change in medication Continue Protonix 40 mg daily Continue Zofran and Phenergan for now Schedule EGD with biopsies  Altered bowel habits: Could be functional  With history of chronic tobacco use, check pancreatic fecal elastase CT did not reveal any pancreatic lesions or pancreatic atrophy or calcifications   Follow up in 1 month   Arlyss Repressohini R Kaleeah Gingerich, MD

## 2018-09-23 ENCOUNTER — Ambulatory Visit: Payer: BLUE CROSS/BLUE SHIELD | Admitting: Certified Registered"

## 2018-09-23 ENCOUNTER — Encounter
Admission: RE | Disposition: A | Discharge status: Home / Self Care | Payer: Self-pay | Source: Home / Self Care | Attending: Gastroenterology

## 2018-09-23 ENCOUNTER — Ambulatory Visit
Admission: RE | Admit: 2018-09-23 | Discharge: 2018-09-23 | Disposition: A | Discharge status: Home / Self Care | Payer: BLUE CROSS/BLUE SHIELD | Attending: Gastroenterology | Admitting: Gastroenterology

## 2018-09-23 DIAGNOSIS — Z79899 Other long term (current) drug therapy: Secondary | ICD-10-CM | POA: Diagnosis not present

## 2018-09-23 DIAGNOSIS — F319 Bipolar disorder, unspecified: Secondary | ICD-10-CM | POA: Diagnosis not present

## 2018-09-23 DIAGNOSIS — J449 Chronic obstructive pulmonary disease, unspecified: Secondary | ICD-10-CM | POA: Diagnosis not present

## 2018-09-23 DIAGNOSIS — G8929 Other chronic pain: Secondary | ICD-10-CM

## 2018-09-23 DIAGNOSIS — F17210 Nicotine dependence, cigarettes, uncomplicated: Secondary | ICD-10-CM | POA: Diagnosis not present

## 2018-09-23 DIAGNOSIS — R1115 Cyclical vomiting syndrome unrelated to migraine: Secondary | ICD-10-CM | POA: Insufficient documentation

## 2018-09-23 DIAGNOSIS — R1013 Epigastric pain: Secondary | ICD-10-CM | POA: Insufficient documentation

## 2018-09-23 DIAGNOSIS — K219 Gastro-esophageal reflux disease without esophagitis: Secondary | ICD-10-CM | POA: Diagnosis not present

## 2018-09-23 DIAGNOSIS — F419 Anxiety disorder, unspecified: Secondary | ICD-10-CM | POA: Insufficient documentation

## 2018-09-23 DIAGNOSIS — R112 Nausea with vomiting, unspecified: Secondary | ICD-10-CM | POA: Diagnosis not present

## 2018-09-23 HISTORY — PX: ESOPHAGOGASTRODUODENOSCOPY (EGD) WITH PROPOFOL: SHX5813

## 2018-09-23 LAB — URINE DRUG SCREEN, QUALITATIVE (ARMC ONLY)
Amphetamines, Ur Screen: NOT DETECTED
BARBITURATES, UR SCREEN: POSITIVE — AB
Benzodiazepine, Ur Scrn: POSITIVE — AB
Cannabinoid 50 Ng, Ur ~~LOC~~: POSITIVE — AB
Cocaine Metabolite,Ur ~~LOC~~: NOT DETECTED
MDMA (Ecstasy)Ur Screen: NOT DETECTED
Methadone Scn, Ur: NOT DETECTED
Opiate, Ur Screen: POSITIVE — AB
Phencyclidine (PCP) Ur S: NOT DETECTED
TRICYCLIC, UR SCREEN: NOT DETECTED

## 2018-09-23 SURGERY — ESOPHAGOGASTRODUODENOSCOPY (EGD) WITH PROPOFOL
Anesthesia: General

## 2018-09-23 MED ORDER — LIDOCAINE HCL (CARDIAC) PF 100 MG/5ML IV SOSY
PREFILLED_SYRINGE | INTRAVENOUS | Status: DC | PRN
  Administered 2018-09-23: 60 mg via INTRAVENOUS

## 2018-09-23 MED ORDER — LIDOCAINE HCL (PF) 2 % IJ SOLN
INTRAMUSCULAR | Status: AC
  Filled 2018-09-23: qty 10

## 2018-09-23 MED ORDER — MIDAZOLAM HCL 2 MG/2ML IJ SOLN
INTRAMUSCULAR | Status: DC | PRN
  Administered 2018-09-23: 2 mg via INTRAVENOUS

## 2018-09-23 MED ORDER — PROPOFOL 500 MG/50ML IV EMUL
INTRAVENOUS | Status: DC | PRN
  Administered 2018-09-23: 140 ug/kg/min via INTRAVENOUS

## 2018-09-23 MED ORDER — SODIUM CHLORIDE 0.9 % IV SOLN
INTRAVENOUS | Status: DC
  Administered 2018-09-23: 1000 mL via INTRAVENOUS

## 2018-09-23 MED ORDER — PROPOFOL 10 MG/ML IV BOLUS
INTRAVENOUS | Status: AC
  Filled 2018-09-23: qty 20

## 2018-09-23 MED ORDER — PROPOFOL 10 MG/ML IV BOLUS
INTRAVENOUS | Status: DC | PRN
  Administered 2018-09-23: 70 mg via INTRAVENOUS

## 2018-09-23 MED ORDER — MIDAZOLAM HCL 2 MG/2ML IJ SOLN
INTRAMUSCULAR | Status: AC
  Filled 2018-09-23: qty 2

## 2018-09-23 NOTE — Transfer of Care (Signed)
Immediate Anesthesia Transfer of Care Note  Patient: Erin ClarkSheri L [name]  Procedure(s) Performed: ESOPHAGOGASTRODUODENOSCOPY (EGD) WITH PROPOFOL (N/A )  Patient Location: PACU  Anesthesia Type:General  Level of Consciousness: awake, alert  and responds to stimulation  Airway & Oxygen Therapy: Patient Spontanous Breathing and Patient connected to nasal cannula oxygen  Post-op Assessment: Report given to RN and Post -op Vital signs reviewed and stable  Post vital signs: Reviewed and stable  Last Vitals:  Vitals Value Taken Time  BP 94/69 09/23/2018  2:36 PM  Temp    Pulse 85 09/23/2018  2:36 PM  Resp 16 09/23/2018  2:36 PM  SpO2 98 % 09/23/2018  2:36 PM  Vitals shown include unvalidated device data.  Last Pain:  Vitals:   09/23/18 1401  TempSrc:   PainSc: 0-No pain      Patients Stated Pain Goal: 0 (09/23/18 1401)  Complications: No apparent anesthesia complications

## 2018-09-23 NOTE — Anesthesia Post-op Follow-up Note (Signed)
Anesthesia QCDR form completed.        

## 2018-09-23 NOTE — Anesthesia Procedure Notes (Signed)
Performed by: Sahara Fujimoto, CRNA Pre-anesthesia Checklist: Patient identified, Emergency Drugs available, Suction available, Patient being monitored and Timeout performed Patient Re-evaluated:Patient Re-evaluated prior to induction Oxygen Delivery Method: Nasal cannula Induction Type: IV induction       

## 2018-09-23 NOTE — Anesthesia Preprocedure Evaluation (Signed)
Anesthesia Evaluation  Patient identified by MRN, date of birth, ID band Patient awake    Reviewed: Allergy & Precautions, H&P , NPO status , Patient's Chart, lab work & pertinent test results, reviewed documented beta blocker date and time   History of Anesthesia Complications Negative for: history of anesthetic complications  Airway Mallampati: I  TM Distance: >3 FB Neck ROM: full    Dental  (+) Dental Advidsory Given, Teeth Intact   Pulmonary shortness of breath and with exertion, COPD, neg recent URI, Current Smoker,           Cardiovascular Exercise Tolerance: Good negative cardio ROS       Neuro/Psych  Headaches, Seizures -, Well Controlled,  PSYCHIATRIC DISORDERS Anxiety Depression Bipolar Disorder    GI/Hepatic Neg liver ROS, GERD  ,  Endo/Other  negative endocrine ROS  Renal/GU negative Renal ROS  negative genitourinary   Musculoskeletal   Abdominal   Peds  Hematology negative hematology ROS (+)   Anesthesia Other Findings Past Medical History: No date: Anxiety disorder No date: Back pain, chronic No date: Bipolar disorder (HCC) No date: Cellulitis No date: Cervicalgia No date: Chicken pox No date: Chronic back pain No date: Depression No date: Drug abuse (HCC) No date: GERD (gastroesophageal reflux disease) No date: Hx of migraines No date: Hyperlipidemia No date: Insomnia No date: Migraine No date: Nicotine dependence unknown: Seizure (HCC) No date: Seizures (HCC)     Comment:  pt had 1 seizure October 2012- no other history of               seizures No date: Vitamin D deficiency   Reproductive/Obstetrics negative OB ROS                             Anesthesia Physical Anesthesia Plan  ASA: II  Anesthesia Plan: General   Post-op Pain Management:    Induction:   PONV Risk Score and Plan: Propofol infusion and TIVA  Airway Management Planned:    Additional Equipment:   Intra-op Plan:   Post-operative Plan:   Informed Consent: I have reviewed the patients History and Physical, chart, labs and discussed the procedure including the risks, benefits and alternatives for the proposed anesthesia with the patient or authorized representative who has indicated his/her understanding and acceptance.   Dental Advisory Given  Plan Discussed with: Anesthesiologist, CRNA and Surgeon  Anesthesia Plan Comments:         Anesthesia Quick Evaluation

## 2018-09-23 NOTE — H&P (Signed)
Arlyss Repressohini R Kimiya Brunelle, MD [address]  [address]  [place], [place] [id number]  Main: [phone]  Fax: [phone] Pager: [phone]  Primary Care Physician:  Evelene CroonNiemeyer, Meindert, MD Primary Gastroenterologist:  Dr. Arlyss Repressohini R Camdyn Beske  Pre-Procedure History & Physical: HPI:  Erin [name] is a 47 y.o. female is here for an endoscopy.   Past Medical History:  Diagnosis Date  . Anxiety disorder   . Back pain, chronic   . Bipolar disorder (HCC)   . Cellulitis   . Cervicalgia   . Chicken pox   . Chronic back pain   . Depression   . Drug abuse (HCC)   . GERD (gastroesophageal reflux disease)   . Hx of migraines   . Hyperlipidemia   . Insomnia   . Migraine   . Nicotine dependence   . Seizure (HCC) unknown  . Seizures (HCC)    pt had 1 seizure October 2012- no other history of seizures  . Vitamin D deficiency     Past Surgical History:  Procedure Laterality Date  . ABDOMINAL HYSTERECTOMY    . BREAST SURGERY    . CESAREAN SECTION    . CHOLECYSTECTOMY    . ESOPHAGOGASTRODUODENOSCOPY      Prior to Admission medications   Medication Sig Start Date End Date Taking? Authorizing Provider  albuterol (PROVENTIL HFA;VENTOLIN HFA) 108 (90 Base) MCG/ACT inhaler Inhale into the lungs. 03/09/14  Yes [provider]  butalbital-acetaminophen-caffeine (FIORICET, ESGIC) 50-325-40 MG tablet Take 1 tablet by mouth every 6 (six) hours as needed.    Yes [provider]  Cyanocobalamin (VITAMIN B-12) 5000 MCG TBDP Take by mouth.   Yes [provider]  cyclobenzaprine (FLEXERIL) 10 MG tablet  08/18/18  Yes [provider]  diazepam (VALIUM) 10 MG tablet  08/28/18  Yes [provider]  escitalopram (LEXAPRO) 10 MG tablet  09/10/18  Yes [provider]  gabapentin (NEURONTIN) 800 MG tablet Take 800 mg by mouth 3 (three) times daily.   Yes [provider]  OLANZapine (ZYPREXA) 5 MG tablet Take 1 tablet (5 mg total) by mouth at bedtime.  09/17/18 12/16/18 Yes Cyncere Sontag, Loel Dubonnetohini Reddy, MD  pantoprazole (PROTONIX) 40 MG tablet  07/22/17  Yes [provider]  hydrOXYzine (ATARAX/VISTARIL) 25 MG tablet Take 25 mg by mouth every 6 (six) hours as needed.    [provider]  ketorolac (TORADOL) 10 MG tablet Take 1 tablet (10 mg total) by mouth every 8 (eight) hours. Patient not taking: Reported on 09/17/2018 08/28/18   Menshew, Charlesetta IvoryJenise V Bacon, PA-C  metoCLOPramide (REGLAN) 10 MG tablet Take 0.5 tablets (5 mg total) by mouth every 8 (eight) hours as needed for nausea or vomiting. Patient not taking: Reported on 09/17/2018 08/28/18   Menshew, Charlesetta IvoryJenise V Bacon, PA-C  ondansetron [facility](ZOFRAN) 8 MG tablet  09/03/18   [provider]  predniSONE (DELTASONE) 10 MG tablet Take 1 tablet (10 mg total) by mouth 2 (two) times daily with a meal. Patient not taking: Reported on 09/17/2018 08/28/18   Menshew, Charlesetta IvoryJenise V Bacon, PA-C  promethazine (PHENERGAN) 25 MG tablet  08/18/18   [provider]  propranolol (INDERAL) 10 MG tablet Take 1 tablet (10 mg total) by mouth 3 (three) times daily as needed. Patient not taking: Reported on 09/17/2018 07/24/17   Jomarie LongsEappen, Saramma, MD  QUEtiapine (SEROQUEL) 25 MG tablet  09/05/18   [provider]  sertraline (ZOLOFT) 100 MG tablet Take 150 mg by mouth.    [provider]  traZODone (DESYREL) 100 MG tablet Take 100 mg by mouth at bedtime.    [provider]    Allergies as of 09/17/2018 - Review Complete 09/17/2018  Allergen Reaction Noted  . Clonidine derivatives Other (See Comments) 11/11/2011  . Paxil [paroxetine hcl]  02/29/2016  . Simvastatin  02/29/2016  . Sulfa drugs cross reactors Other (See Comments) 11/11/2011    Family History  Problem Relation Age of Onset  . Diabetes Mother   . Depression Mother   . Anxiety disorder Mother   . Alcohol abuse Father   . Asthma Father   . COPD Father   . Early death Father   . Bipolar disorder Father   .  Heart failure Sister        from chemotherapy   . Arthritis Maternal Grandmother   . Stroke Maternal Grandmother   . Pancreatic cancer Maternal Grandfather   . Anxiety disorder Maternal Grandfather   . Depression Maternal Grandfather   . Bipolar disorder Paternal Uncle   . Bipolar disorder Cousin     Social History   Socioeconomic History  . Marital status: Single    Spouse name: Not on file  . Number of children: Not on file  . Years of education: Not on file  . Highest education level: Not on file  Occupational History  . Not on file  Social Needs  . Financial resource strain: Not on file  . Food insecurity:    Worry: Not on file    Inability: Not on file  . Transportation needs:    Medical: Not on file    Non-medical: Not on file  Tobacco Use  . Smoking status: Current Every Day Smoker    Packs/day: 0.50    Years: 25.00    Pack years: 12.50    Types: Cigarettes  . Smokeless tobacco: Never Used  . Tobacco comment: currently using a vapor as well.   Substance and Sexual Activity  . Alcohol use: No    Alcohol/week: 0.0 standard drinks    Comment: none for 3 yrs  . Drug use: No    Types: Marijuana, Cocaine, Heroin, Oxycodone    Comment: yr straight  . Sexual activity: Not Currently    Birth control/protection: None  Lifestyle  . Physical activity:    Days per week: Not on file    Minutes per session: Not on file  . Stress: Not on file  Relationships  . Social connections:    Talks on phone: Not on file    Gets together: Not on file    Attends religious service: Not on file    Active member of club or organization: Not on file    Attends meetings of clubs or organizations: Not on file    Relationship status: Not on file  . Intimate partner violence:    Fear of current or ex partner: Not on file    Emotionally abused: Not on file    Physically abused: Not on file    Forced sexual activity: Not on file  Other Topics Concern  . Not on file  Social History  Narrative  . Not on file    Review of Systems: See HPI, otherwise negative ROS  Physical Exam: BP (!) 118/96   Pulse 60   Temp (!) 97 F (36.1 C) (Tympanic)   Resp 20   Ht 5\' 9"  (1.753 m)   Wt 54 kg   SpO2 100%   BMI 17.57 kg/m  General:  Alert,  pleasant and cooperative in NAD Head:  Normocephalic and atraumatic. Neck:  Supple; no masses or thyromegaly. Lungs:  Clear throughout to auscultation.    Heart:  Regular rate and rhythm. Abdomen:  Soft, nontender and nondistended. Normal bowel sounds, without guarding, and without rebound.   Neurologic:  Alert and  oriented x4;  grossly normal neurologically.  Impression/Plan: Jonna Clark [place]ittier is here for an endoscopy to be performed for intractable nausea  Risks, benefits, limitations, and alternatives regarding  endoscopy have been reviewed with the patient.  Questions have been answered.  All parties agreeable.   Lannette Donath, MD  09/23/2018, 1:26 PM

## 2018-09-23 NOTE — Op Note (Signed)
[facility] Gastroenterology Patient Name: Erin [name] Procedure Date: 09/23/2018 2:17 PM MRN: [id number] Account #: [id number] Date of Birth: [birth year] Admit Type: Outpatient Age: 47 Room: [facility] ENDO ROOM 4 Gender: Female Note Status: Finalized Procedure:            Upper GI endoscopy Indications:          Nausea with vomiting, Persistent vomiting of unknown                        cause Providers:            Lin Landsman MD, MD Referring MD:         Lorelee Market (Referring MD) Medicines:            Monitored Anesthesia Care Complications:        No immediate complications. Estimated blood loss:                        Minimal. Procedure:            Pre-Anesthesia Assessment:                       - Prior to the procedure, a History and Physical was                        performed, and patient medications and allergies were                        reviewed. The patient is competent. The risks and                        benefits of the procedure and the sedation options and                        risks were discussed with the patient. All questions                        were answered and informed consent was obtained.                        Patient identification and proposed procedure were                        verified by the physician, the nurse, the                        anesthesiologist, the anesthetist and the technician in                        the pre-procedure area in the procedure room in the                        endoscopy suite. Mental Status Examination: alert and                        oriented. Airway Examination: normal oropharyngeal                        airway and neck mobility. Respiratory Examination:  clear to auscultation. CV Examination: normal.                        Prophylactic Antibiotics: The patient does not require                        prophylactic antibiotics. Prior Anticoagulants: The                         patient has taken no previous anticoagulant or                        antiplatelet agents. ASA Grade Assessment: II - A                        patient with mild systemic disease. After reviewing the                        risks and benefits, the patient was deemed in                        satisfactory condition to undergo the procedure. The                        anesthesia plan was to use monitored anesthesia care                        (MAC). Immediately prior to administration of                        medications, the patient was re-assessed for adequacy                        to receive sedatives. The heart rate, respiratory rate,                        oxygen saturations, blood pressure, adequacy of                        pulmonary ventilation, and response to care were                        monitored throughout the procedure. The physical status                        of the patient was re-assessed after the procedure.                       After obtaining informed consent, the endoscope was                        passed under direct vision. Throughout the procedure,                        the patient's blood pressure, pulse, and oxygen                        saturations were monitored continuously. The Endoscope  was introduced through the mouth, and advanced to the                        second part of duodenum. The upper GI endoscopy was                        accomplished without difficulty. The patient tolerated                        the procedure well. Findings:      The duodenal bulb and second portion of the duodenum were normal.       Biopsies for histology were taken with a cold forceps for evaluation of       celiac disease.      The entire examined stomach was normal. Biopsies were taken with a cold       forceps for Helicobacter pylori testing. Estimated blood loss was       minimal. To stop active bleeding, one hemostatic clip  was successfully       placed (MR conditional). There was no bleeding at the end of the       procedure.      The cardia and gastric fundus were normal on retroflexion.      Esophagogastric landmarks were identified: the gastroesophageal junction       was found at 40 cm from the incisors.      The gastroesophageal junction and examined esophagus were normal. Impression:           - Normal duodenal bulb and second portion of the                        duodenum. Biopsied.                       - Normal stomach. Biopsied. Clip (MR conditional) was                        placed.                       - Esophagogastric landmarks identified.                       - Normal gastroesophageal junction and esophagus. Recommendation:       - Discharge patient to home (with escort).                       - Resume previous diet today.                       - Continue present medications.                       - Await pathology results.                       - Return to my office as previously scheduled. Procedure Code(s):    --- Professional ---                       45038, 11, Esophagogastroduodenoscopy, flexible,                        transoral;  with control of bleeding, any method                       43239, Esophagogastroduodenoscopy, flexible, transoral;                        with biopsy, single or multiple Diagnosis Code(s):    --- Professional ---                       R11.2, Nausea with vomiting, unspecified                       R11.10, Vomiting, unspecified CPT copyright 2018 American Medical Association. All rights reserved. The codes documented in this report are preliminary and upon coder review may  be revised to meet current compliance requirements. Dr. Ulyess Mort Lin Landsman MD, MD 09/23/2018 2:36:17 PM This report has been signed electronically. Number of Addenda: 0 Note Initiated On: 09/23/2018 2:17 PM      [facility]

## 2018-09-24 ENCOUNTER — Encounter: Payer: Self-pay | Admitting: Gastroenterology

## 2018-09-24 NOTE — Anesthesia Postprocedure Evaluation (Signed)
Anesthesia Post Note  Patient: Erin [name]  Procedure(s) Performed: ESOPHAGOGASTRODUODENOSCOPY (EGD) WITH PROPOFOL (N/A )  Patient location during evaluation: Endoscopy Anesthesia Type: General Level of consciousness: awake and alert Pain management: pain level controlled Vital Signs Assessment: post-procedure vital signs reviewed and stable Respiratory status: spontaneous breathing, nonlabored ventilation, respiratory function stable and patient connected to nasal cannula oxygen Cardiovascular status: blood pressure returned to baseline and stable Postop Assessment: no apparent nausea or vomiting Anesthetic complications: no     Last Vitals:  Vitals:   09/23/18 1456 09/23/18 1506  BP: (!) 115/103   Pulse: 70 66  Resp: 12 11  Temp:    SpO2: 98% 99%    Last Pain:  Vitals:   09/23/18 1506  TempSrc:   PainSc: 0-No pain                 Lenard SimmerAndrew Savhanna Sliva

## 2018-09-25 LAB — SURGICAL PATHOLOGY

## 2018-10-31 ENCOUNTER — Ambulatory Visit: Payer: Self-pay | Admitting: Gastroenterology

## 2019-03-31 ENCOUNTER — Other Ambulatory Visit: Payer: Self-pay

## 2019-03-31 ENCOUNTER — Emergency Department: Payer: Self-pay

## 2019-03-31 ENCOUNTER — Emergency Department
Admission: EM | Admit: 2019-03-31 | Discharge: 2019-04-01 | Disposition: A | Discharge status: Other Acute Inpatient Hospital | Payer: Self-pay | Attending: Internal Medicine | Admitting: Internal Medicine

## 2019-03-31 DIAGNOSIS — F329 Major depressive disorder, single episode, unspecified: Secondary | ICD-10-CM | POA: Diagnosis present

## 2019-03-31 DIAGNOSIS — F410 Panic disorder [episodic paroxysmal anxiety] without agoraphobia: Secondary | ICD-10-CM | POA: Insufficient documentation

## 2019-03-31 DIAGNOSIS — F419 Anxiety disorder, unspecified: Secondary | ICD-10-CM | POA: Diagnosis present

## 2019-03-31 DIAGNOSIS — F1010 Alcohol abuse, uncomplicated: Secondary | ICD-10-CM | POA: Diagnosis present

## 2019-03-31 DIAGNOSIS — F32A Depression, unspecified: Secondary | ICD-10-CM | POA: Diagnosis present

## 2019-03-31 DIAGNOSIS — F1510 Other stimulant abuse, uncomplicated: Secondary | ICD-10-CM | POA: Insufficient documentation

## 2019-03-31 DIAGNOSIS — F1410 Cocaine abuse, uncomplicated: Secondary | ICD-10-CM | POA: Insufficient documentation

## 2019-03-31 DIAGNOSIS — Z20828 Contact with and (suspected) exposure to other viral communicable diseases: Secondary | ICD-10-CM | POA: Insufficient documentation

## 2019-03-31 DIAGNOSIS — F17210 Nicotine dependence, cigarettes, uncomplicated: Secondary | ICD-10-CM | POA: Insufficient documentation

## 2019-03-31 DIAGNOSIS — J029 Acute pharyngitis, unspecified: Secondary | ICD-10-CM | POA: Insufficient documentation

## 2019-03-31 DIAGNOSIS — J019 Acute sinusitis, unspecified: Secondary | ICD-10-CM | POA: Diagnosis present

## 2019-03-31 DIAGNOSIS — F319 Bipolar disorder, unspecified: Secondary | ICD-10-CM | POA: Insufficient documentation

## 2019-03-31 DIAGNOSIS — F1110 Opioid abuse, uncomplicated: Secondary | ICD-10-CM | POA: Insufficient documentation

## 2019-03-31 DIAGNOSIS — G8929 Other chronic pain: Secondary | ICD-10-CM | POA: Diagnosis present

## 2019-03-31 DIAGNOSIS — F1910 Other psychoactive substance abuse, uncomplicated: Secondary | ICD-10-CM

## 2019-03-31 DIAGNOSIS — F1310 Sedative, hypnotic or anxiolytic abuse, uncomplicated: Secondary | ICD-10-CM | POA: Insufficient documentation

## 2019-03-31 LAB — URINALYSIS, COMPLETE (UACMP) WITH MICROSCOPIC
Bacteria, UA: NONE SEEN
Bilirubin Urine: NEGATIVE
Glucose, UA: NEGATIVE mg/dL
Ketones, ur: 20 mg/dL — AB
Leukocytes,Ua: NEGATIVE
Nitrite: NEGATIVE
Protein, ur: 100 mg/dL — AB
Specific Gravity, Urine: 1.029 (ref 1.005–1.030)
pH: 5 (ref 5.0–8.0)

## 2019-03-31 LAB — URINE DRUG SCREEN, QUALITATIVE (ARMC ONLY)
Amphetamines, Ur Screen: POSITIVE — AB
Barbiturates, Ur Screen: POSITIVE — AB
Benzodiazepine, Ur Scrn: POSITIVE — AB
Cannabinoid 50 Ng, Ur ~~LOC~~: NOT DETECTED
Cocaine Metabolite,Ur ~~LOC~~: POSITIVE — AB
MDMA (Ecstasy)Ur Screen: NOT DETECTED
Methadone Scn, Ur: NOT DETECTED
Opiate, Ur Screen: POSITIVE — AB
Phencyclidine (PCP) Ur S: NOT DETECTED
Tricyclic, Ur Screen: POSITIVE — AB

## 2019-03-31 LAB — CBC WITH DIFFERENTIAL/PLATELET
Abs Immature Granulocytes: 0.2 10*3/uL — ABNORMAL HIGH (ref 0.00–0.07)
Basophils Absolute: 0.2 10*3/uL — ABNORMAL HIGH (ref 0.0–0.1)
Basophils Relative: 1 %
Eosinophils Absolute: 0.1 10*3/uL (ref 0.0–0.5)
Eosinophils Relative: 0 %
HCT: 39.4 % (ref 36.0–46.0)
Hemoglobin: 13.7 g/dL (ref 12.0–15.0)
Immature Granulocytes: 1 %
Lymphocytes Relative: 7 %
Lymphs Abs: 1.9 10*3/uL (ref 0.7–4.0)
MCH: 31.8 pg (ref 26.0–34.0)
MCHC: 34.8 g/dL (ref 30.0–36.0)
MCV: 91.4 fL (ref 80.0–100.0)
Monocytes Absolute: 2.1 10*3/uL — ABNORMAL HIGH (ref 0.1–1.0)
Monocytes Relative: 8 %
Neutro Abs: 23.7 10*3/uL — ABNORMAL HIGH (ref 1.7–7.7)
Neutrophils Relative %: 83 %
Platelets: 259 10*3/uL (ref 150–400)
RBC: 4.31 MIL/uL (ref 3.87–5.11)
RDW: 12.3 % (ref 11.5–15.5)
WBC: 28 10*3/uL — ABNORMAL HIGH (ref 4.0–10.5)
nRBC: 0 % (ref 0.0–0.2)

## 2019-03-31 LAB — COMPREHENSIVE METABOLIC PANEL
ALT: 20 U/L (ref 0–44)
AST: 32 U/L (ref 15–41)
Albumin: 4.7 g/dL (ref 3.5–5.0)
Alkaline Phosphatase: 111 U/L (ref 38–126)
Anion gap: 13 (ref 5–15)
BUN: 28 mg/dL — ABNORMAL HIGH (ref 6–20)
CO2: 20 mmol/L — ABNORMAL LOW (ref 22–32)
Calcium: 9.5 mg/dL (ref 8.9–10.3)
Chloride: 106 mmol/L (ref 98–111)
Creatinine, Ser: 0.99 mg/dL (ref 0.44–1.00)
GFR calc Af Amer: >60 mL/min (ref >60)
GFR calc non Af Amer: >60 mL/min (ref >60)
Glucose, Bld: 89 mg/dL (ref 70–99)
Potassium: 3.8 mmol/L (ref 3.5–5.1)
Sodium: 139 mmol/L (ref 135–145)
Total Bilirubin: 0.7 mg/dL (ref 0.3–1.2)
Total Protein: 7.9 g/dL (ref 6.5–8.1)

## 2019-03-31 LAB — ETHANOL: Alcohol, Ethyl (B): <10 mg/dL (ref <10)

## 2019-03-31 MED ORDER — LORAZEPAM 2 MG/ML IJ SOLN
1.0000 mg | Freq: Once | INTRAMUSCULAR | Status: AC
  Administered 2019-03-31: 1 mg via INTRAVENOUS
  Filled 2019-03-31: qty 1

## 2019-03-31 MED ORDER — SODIUM CHLORIDE 0.9 % IV SOLN
Freq: Once | INTRAVENOUS | Status: AC
  Administered 2019-03-31: 22:00:00 via INTRAVENOUS

## 2019-03-31 NOTE — ED Triage Notes (Signed)
Patient arrived via [facility]. With reports of a panic attack and a sore throat.  Patient admits to taking an Adderaol.

## 2019-03-31 NOTE — ED Notes (Signed)
Spoke with pt's mother again and notified that pt will be under IVC
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
## 2019-03-31 NOTE — ED Notes (Signed)
Patient currently unable to stay still. Patient states she has to "spit and cannot get it out". Patient unable to be consoled.  Head of head elevated. Bed low and locked. Emesis bag given.
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
## 2019-03-31 NOTE — ED Notes (Signed)
Patients O2 saturation dropped to 89% placed on 2L [place]. Returned to 100%

## 2019-03-31 NOTE — ED Notes (Addendum)
Patient admitted to doing "A little bit of cocaine" with SO  Yesterday. Denies use today

## 2019-03-31 NOTE — ED Notes (Signed)
This RN and MD in with pt to discuss plan of care. Pt is to receive a liter of fluid and then labs rechecked and then a decision made. Pt is more calm and mannerisms are more subdue. Pt is cooperative but still obviously under the influence. Pt verbalizes understanding about plan of care. Mother update by Lattie Haw RN after permission given by pt. Mom will be pt ride home if d/c.

## 2019-04-01 ENCOUNTER — Other Ambulatory Visit: Payer: Self-pay

## 2019-04-01 ENCOUNTER — Encounter: Payer: Self-pay | Admitting: Behavioral Health

## 2019-04-01 ENCOUNTER — Inpatient Hospital Stay
Admission: AD | Admit: 2019-04-01 | Discharge: 2019-04-02 | DRG: 885 | Disposition: A | Discharge status: Home / Self Care | Payer: No Typology Code available for payment source | Attending: Psychiatry | Admitting: Psychiatry

## 2019-04-01 DIAGNOSIS — Z9049 Acquired absence of other specified parts of digestive tract: Secondary | ICD-10-CM | POA: Diagnosis not present

## 2019-04-01 DIAGNOSIS — Z90710 Acquired absence of both cervix and uterus: Secondary | ICD-10-CM | POA: Diagnosis not present

## 2019-04-01 DIAGNOSIS — F411 Generalized anxiety disorder: Secondary | ICD-10-CM | POA: Diagnosis present

## 2019-04-01 DIAGNOSIS — F329 Major depressive disorder, single episode, unspecified: Secondary | ICD-10-CM

## 2019-04-01 DIAGNOSIS — Z818 Family history of other mental and behavioral disorders: Secondary | ICD-10-CM | POA: Diagnosis not present

## 2019-04-01 DIAGNOSIS — K219 Gastro-esophageal reflux disease without esophagitis: Secondary | ICD-10-CM | POA: Diagnosis present

## 2019-04-01 DIAGNOSIS — F1994 Other psychoactive substance use, unspecified with psychoactive substance-induced mood disorder: Secondary | ICD-10-CM | POA: Diagnosis present

## 2019-04-01 DIAGNOSIS — E785 Hyperlipidemia, unspecified: Secondary | ICD-10-CM | POA: Diagnosis present

## 2019-04-01 DIAGNOSIS — F1110 Opioid abuse, uncomplicated: Secondary | ICD-10-CM | POA: Diagnosis present

## 2019-04-01 DIAGNOSIS — Z20828 Contact with and (suspected) exposure to other viral communicable diseases: Secondary | ICD-10-CM | POA: Diagnosis present

## 2019-04-01 DIAGNOSIS — G4700 Insomnia, unspecified: Secondary | ICD-10-CM | POA: Diagnosis present

## 2019-04-01 DIAGNOSIS — Z882 Allergy status to sulfonamides status: Secondary | ICD-10-CM | POA: Diagnosis not present

## 2019-04-01 DIAGNOSIS — Z79899 Other long term (current) drug therapy: Secondary | ICD-10-CM | POA: Diagnosis not present

## 2019-04-01 DIAGNOSIS — F1490 Cocaine use, unspecified, uncomplicated: Secondary | ICD-10-CM | POA: Diagnosis present

## 2019-04-01 DIAGNOSIS — Z811 Family history of alcohol abuse and dependence: Secondary | ICD-10-CM

## 2019-04-01 DIAGNOSIS — Z915 Personal history of self-harm: Secondary | ICD-10-CM

## 2019-04-01 DIAGNOSIS — F3112 Bipolar disorder, current episode manic without psychotic features, moderate: Principal | ICD-10-CM | POA: Insufficient documentation

## 2019-04-01 DIAGNOSIS — F419 Anxiety disorder, unspecified: Secondary | ICD-10-CM

## 2019-04-01 DIAGNOSIS — F1510 Other stimulant abuse, uncomplicated: Secondary | ICD-10-CM

## 2019-04-01 DIAGNOSIS — Z888 Allergy status to other drugs, medicaments and biological substances status: Secondary | ICD-10-CM | POA: Diagnosis not present

## 2019-04-01 LAB — CBC WITH DIFFERENTIAL/PLATELET
Abs Immature Granulocytes: 0.11 10*3/uL — ABNORMAL HIGH (ref 0.00–0.07)
Basophils Absolute: 0.1 10*3/uL (ref 0.0–0.1)
Basophils Relative: 0 %
Eosinophils Absolute: 0 10*3/uL (ref 0.0–0.5)
Eosinophils Relative: 0 %
HCT: 36.3 % (ref 36.0–46.0)
Hemoglobin: 12.4 g/dL (ref 12.0–15.0)
Immature Granulocytes: 1 %
Lymphocytes Relative: 6 %
Lymphs Abs: 1.2 10*3/uL (ref 0.7–4.0)
MCH: 32 pg (ref 26.0–34.0)
MCHC: 34.2 g/dL (ref 30.0–36.0)
MCV: 93.8 fL (ref 80.0–100.0)
Monocytes Absolute: 1 10*3/uL (ref 0.1–1.0)
Monocytes Relative: 6 %
Neutro Abs: 15.9 10*3/uL — ABNORMAL HIGH (ref 1.7–7.7)
Neutrophils Relative %: 87 %
Platelets: 205 10*3/uL (ref 150–400)
RBC: 3.87 MIL/uL (ref 3.87–5.11)
RDW: 12.4 % (ref 11.5–15.5)
WBC: 18.2 10*3/uL — ABNORMAL HIGH (ref 4.0–10.5)
nRBC: 0 % (ref 0.0–0.2)

## 2019-04-01 LAB — SARS CORONAVIRUS 2 BY RT PCR (HOSPITAL ORDER, PERFORMED IN ~~LOC~~ HOSPITAL LAB): SARS Coronavirus 2: NEGATIVE

## 2019-04-01 MED ORDER — ACETAMINOPHEN 325 MG PO TABS
650.0000 mg | ORAL_TABLET | Freq: Once | ORAL | Status: AC
  Administered 2019-04-01: 650 mg via ORAL
  Filled 2019-04-01: qty 2

## 2019-04-01 MED ORDER — ESCITALOPRAM OXALATE 10 MG PO TABS
10.0000 mg | ORAL_TABLET | Freq: Every day | ORAL | Status: DC
  Administered 2019-04-01 – 2019-04-02 (×2): 10 mg via ORAL
  Filled 2019-04-01 (×2): qty 1

## 2019-04-01 MED ORDER — HYDROXYZINE HCL 25 MG PO TABS
25.0000 mg | ORAL_TABLET | Freq: Three times a day (TID) | ORAL | Status: DC | PRN
  Administered 2019-04-01 – 2019-04-02 (×2): 25 mg via ORAL
  Filled 2019-04-01 (×2): qty 1

## 2019-04-01 MED ORDER — TRAZODONE HCL 50 MG PO TABS
50.0000 mg | ORAL_TABLET | Freq: Every evening | ORAL | Status: DC | PRN
  Administered 2019-04-01: 50 mg via ORAL
  Filled 2019-04-01: qty 1

## 2019-04-01 MED ORDER — LORAZEPAM 2 MG PO TABS
2.0000 mg | ORAL_TABLET | Freq: Once | ORAL | Status: AC
  Administered 2019-04-01: 2 mg via ORAL
  Filled 2019-04-01: qty 1

## 2019-04-01 MED ORDER — NICOTINE 21 MG/24HR TD PT24
21.0000 mg | MEDICATED_PATCH | Freq: Once | TRANSDERMAL | Status: DC
  Administered 2019-04-01: 21 mg via TRANSDERMAL
  Filled 2019-04-01: qty 1

## 2019-04-01 MED ORDER — CYCLOBENZAPRINE HCL 10 MG PO TABS
10.0000 mg | ORAL_TABLET | Freq: Three times a day (TID) | ORAL | Status: DC | PRN
  Administered 2019-04-02: 10 mg via ORAL
  Filled 2019-04-01: qty 1

## 2019-04-01 MED ORDER — ACETAMINOPHEN 325 MG PO TABS
650.0000 mg | ORAL_TABLET | Freq: Four times a day (QID) | ORAL | Status: DC | PRN
  Administered 2019-04-01 – 2019-04-02 (×3): 650 mg via ORAL
  Filled 2019-04-01 (×3): qty 2

## 2019-04-01 MED ORDER — ALUM & MAG HYDROXIDE-SIMETH 200-200-20 MG/5ML PO SUSP: 30.0000 mL | ORAL | Status: DC | PRN

## 2019-04-01 MED ORDER — ALBUTEROL SULFATE HFA 108 (90 BASE) MCG/ACT IN AERS
2.0000 | INHALATION_SPRAY | RESPIRATORY_TRACT | Status: DC | PRN
  Filled 2019-04-01: qty 6.7

## 2019-04-01 MED ORDER — BUTALBITAL-APAP-CAFFEINE 50-325-40 MG PO TABS
1.0000 | ORAL_TABLET | Freq: Four times a day (QID) | ORAL | Status: DC | PRN
  Filled 2019-04-01: qty 1

## 2019-04-01 MED ORDER — MAGNESIUM HYDROXIDE 400 MG/5ML PO SUSP: 30.0000 mL | Freq: Every day | ORAL | Status: DC | PRN

## 2019-04-01 MED ORDER — GABAPENTIN 600 MG PO TABS
300.0000 mg | ORAL_TABLET | Freq: Three times a day (TID) | ORAL | Status: DC
  Administered 2019-04-02 (×2): 300 mg via ORAL
  Filled 2019-04-01 (×2): qty 1

## 2019-04-01 MED ORDER — PANTOPRAZOLE SODIUM 40 MG PO TBEC
40.0000 mg | DELAYED_RELEASE_TABLET | Freq: Every day | ORAL | Status: DC
  Administered 2019-04-01 – 2019-04-02 (×2): 40 mg via ORAL
  Filled 2019-04-01 (×2): qty 1

## 2019-04-01 NOTE — ED Notes (Signed)
Pt. Transferred to [place] from ED to room 2 after screening for contraband. Report to include Situation, Background, Assessment and Recommendations from [employer]. Pt. Oriented to unit including Q15 minute rounds as well as the security cameras for their protection. Patient is alert and oriented, warm and dry in no acute distress. Patient denies SI, HI, and AVH. Pt. Encouraged to let me know if needs arise.

## 2019-04-01 NOTE — Plan of Care (Signed)
  Problem: Education: Goal: Knowledge of [place] [facility] Education information/materials will improve Note: Instructed on [place] Education , unit programing , able to verbalize understanding.    

## 2019-04-01 NOTE — ED Notes (Signed)
Hourly rounding reveals patient sleeping in room. No complaints, stable, in no acute distress. Q15 minute rounds and monitoring via Security Cameras to continue. 

## 2019-04-01 NOTE — BH Assessment (Signed)
Patient is to be admitted to [facility] by Ysidro Evert, NP.  Attending Physician will be Dr. Weber Cooks.   Patient has been assigned to room 310, by [place] Nurse [place].   Intake Paper Work has been signed and placed on patient chart.   ER staff is aware of the admission:  LouAnn, ER Secretary    Dr. Burlene Arnt, ER MD   Donneta Romberg, Patient's Nurse   Butch Penny, Patient Access.

## 2019-04-01 NOTE — Progress Notes (Signed)
Admission Note:  D: Pt appeared depressed  With  a flat affect.  Pt  denies SI / AVH at this time.  Patient voice of explosive  Behavior with her boyfriend  Voice of symptoms related to that of COVID -57. Patient was brought to the hospital . Patient also presented  With a panic attack . UDS .pasitive for Amphetamines Cocaine Opiate Barbiturates and Benzodiazepines. Pt is redirectable and cooperative with assessment.    Past Medical  History Anxiety bipolar Cellutitis  Chronic Back Pain GERD Migraines Seizures Hyperlipidemia Depression     A: Pt admitted to unit per protocol, skin assessment and search done and no contraband found. Noted bruise on left thigh  tattoo at sacrum. Patient has breast implants .   Pt  educated on therapeutic milieu rules. Pt was introduced to milieu by nursing staff.    R: Pt was receptive to education about the milieu .  15 min safety checks started. Probation officer offered support

## 2019-04-01 NOTE — Progress Notes (Signed)
TTS attempted to complete assessment with pt but was unable to get any coherent responses at this time. Winferd Humphrey, MSW, LCSW Clinical Social Worker 04/01/2019 12:41 AM

## 2019-04-01 NOTE — Tx Team (Signed)
Initial Treatment Plan 04/01/2019 4:02 PM Erin [name] [id number]    PATIENT STRESSORS: Financial difficulties Medication change or noncompliance Substance abuse   PATIENT STRENGTHS: Ability for insight Average or above average intelligence Capable of independent living Supportive family/friends   PATIENT IDENTIFIED PROBLEMS: Depression  04/01/2019  Suicidal 04/01/2019  Substance Abuse 04/01/2019   Panic Attacks  04/01/2019               DISCHARGE CRITERIA:  Ability to meet basic life and health needs Improved stabilization in mood, thinking, and/or behavior Motivation to continue treatment in a less acute level of care  PRELIMINARY DISCHARGE PLAN: Outpatient therapy Return to previous living arrangement  PATIENT/FAMILY INVOLVEMENT: This treatment plan has been presented to and reviewed with the patient, Erin [name], and/or family member,  .  The patient and family have been given the opportunity to ask questions and make suggestions.  Leodis Liverpool, RN 04/01/2019, 4:02 PM

## 2019-04-01 NOTE — ED Notes (Signed)
IVC / Consult completed/ Pending Placement 

## 2019-04-01 NOTE — ED Provider Notes (Signed)
-----------------------------------------   2:43 AM on 04/01/2019 -----------------------------------------   Blood pressure 113/76, pulse 86, temperature 98.5 F (36.9 C), temperature source Oral, resp. rate 16, height 1.753 m (5\' 9" ), weight 53.1 kg, SpO2 95 %.  The patient is calm and cooperative at this time.  There have been no acute events since the last update.  Repeat lab work after IV fluids was reassuring and that her leukocytosis is come down substantially.  The leukocytosis is likely secondary to a stress reaction from all of the drug use rather than representative of an acute infection.  She is much calmer at this time.  She is appropriate for transfer to the ED BHU.   Hinda Kehr, MD 04/01/19 [phone]

## 2019-04-01 NOTE — ED Notes (Signed)
Accepted to Inpatient beh med
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
## 2019-04-01 NOTE — Consult Note (Signed)
[facility] Face-to-Face Psychiatry Consult   Reason for Consult: Polysubstance use Referring Physician: Dr. Chrissie NoaWilliam Patient Identification: Erin HongSheri L [name] MRN:  [id number] Principal Diagnosis: Anxiety and depression Diagnosis:  Principal Problem:   Anxiety and depression Active Problems:   Acute sinusitis with symptoms > 10 days   Alcohol abuse, daily use   Abdominal pain, chronic, epigastric   Total Time spent with patient: 1 hour  Subjective: "I cannot talk right now." Erin HongSheri L [name] is a 48 y.o. female patient presented [facility] ED via law enforcement under involuntary commitment status (IVC).  The patient was seen but she is not a good historian due to her experiencing side effects of her poly-substance use.  She presented with generalized uncontrollable movements. She was complaining of experiencing soreness of her throat, she voiced having a cough and "I just do not feel right."  This provider was able to get lots of information from her mom.  It was reported by mom the patient was acting irrational and she called her daughter and was talking about hurting herself.  The patient boyfriend called the patient mom stating the patient was acting irrational and being unsafe to self.  He discussed that the patient had been depressed over the past couple days which at that time the relationship problems started.    The patient was seen face-to-face by this provider; chart reviewed and consulted with Dr.  Rebeca Allegrar.Williams on 03/31/2019 due to the care of the patient. It was discussed with the provider that the patient does meet criteria to be admitted to the inpatient unit once a bed becomes available. On evaluation the patient is alert and oriented x 2-3, agitated, restless, unable to control body movement and uncooperative due to her using several substances.  It was discussed by her mom that she had been awake for over 3 days. The patient does appear to be responding to some type of internal and external  stimuli. Neither is the patient presenting with any delusional thinking. This provider is unable to assess whether the patient is experiencing auditory or visual hallucinations. This provider is unable to assess if the patient is contemplating suicidal, homicidal, or self-harm ideations.  It is difficult to assess the patient for any psychotic or paranoid behaviors.  The patient is not a good historian currently due to her experiencing severe generalized jerkiness and is currently upset.  Collateral was obtained by (mother) Erin [name] [phone] who expresses concerns for daughter poly-substance use. Mom discussed that the patient has been battling substance and alcohol abuse.  She stated that the patient was in recovery for a few years and recently since COVID-19 she had relapse.  She stated this patient did voice suicidal ideation to her daughter.  She discussed that after she had voiced SI they contacted the police and the patient was brought into the hospital.  It was reported by mom that the patient had taken some Adderall and had not had any sleep for 3 days.    Plan: The patient is a safety risk to self  and does require psychiatric inpatient admission for stabilization and treatment.  HPI: Per Dr. Mayford KnifeWilliams; Erin HongSheri L [name] is a 48 y.o. female with a history of anxiety, bipolar disorder, depression, drug abuse, hyperlipidemia, migraines, seizures, nicotine dependence who presents to the ED for panic attack and sore throat.  Patient admits to taking Adderall and recently some cocaine.  She denies any recent illness, presents hysterical  Past Psychiatric History:  Anxiety disorder Bipolar disorder (HCC) Depression  Drug abuse (HCC) Nicotine dependence  Risk to Self:  Yes Risk to Others:  No Prior Inpatient Therapy:  Yes Prior Outpatient Therapy:  Yes  Past Medical History:  Past Medical History:  Diagnosis Date  . Anxiety disorder   . Back pain, chronic   . Bipolar disorder (HCC)    . Cellulitis   . Cervicalgia   . Chicken pox   . Chronic back pain   . Depression   . Drug abuse (HCC)   . GERD (gastroesophageal reflux disease)   . Hx of migraines   . Hyperlipidemia   . Insomnia   . Migraine   . Nicotine dependence   . Seizure (HCC) unknown  . Seizures (HCC)    pt had 1 seizure October 2012- no other history of seizures  . Vitamin D deficiency     Past Surgical History:  Procedure Laterality Date  . ABDOMINAL HYSTERECTOMY    . BREAST SURGERY    . CESAREAN SECTION    . CHOLECYSTECTOMY    . ESOPHAGOGASTRODUODENOSCOPY    . ESOPHAGOGASTRODUODENOSCOPY (EGD) WITH PROPOFOL N/A 09/23/2018   Procedure: ESOPHAGOGASTRODUODENOSCOPY (EGD) WITH PROPOFOL;  Surgeon: Toney ReilVanga, Rohini Reddy, MD;  Location: [facility] ENDOSCOPY;  Service: Gastroenterology;  Laterality: N/A;   Family History:  Family History  Problem Relation Age of Onset  . Diabetes Mother   . Depression Mother   . Anxiety disorder Mother   . Alcohol abuse Father   . Asthma Father   . COPD Father   . Early death Father   . Bipolar disorder Father   . Heart failure Sister        from chemotherapy   . Arthritis Maternal Grandmother   . Stroke Maternal Grandmother   . Pancreatic cancer Maternal Grandfather   . Anxiety disorder Maternal Grandfather   . Depression Maternal Grandfather   . Bipolar disorder Paternal Uncle   . Bipolar disorder Cousin    Family Psychiatric  History:  Alcohol abuse Depression Social History:  Social History   Substance and Sexual Activity  Alcohol Use No  . Alcohol/week: 0.0 standard drinks   Comment: none for 3 yrs     Social History   Substance and Sexual Activity  Drug Use No  . Types: Marijuana, Cocaine, Heroin, Oxycodone   Comment: yr straight    Social History   Socioeconomic History  . Marital status: Single    Spouse name: Not on file  . Number of children: Not on file  . Years of education: Not on file  . Highest education level: Not on file   Occupational History  . Not on file  Social Needs  . Financial resource strain: Not on file  . Food insecurity    Worry: Not on file    Inability: Not on file  . Transportation needs    Medical: Not on file    Non-medical: Not on file  Tobacco Use  . Smoking status: Current Every Day Smoker    Packs/day: 0.50    Years: 25.00    Pack years: 12.50    Types: Cigarettes  . Smokeless tobacco: Never Used  . Tobacco comment: currently using a vapor as well.   Substance and Sexual Activity  . Alcohol use: No    Alcohol/week: 0.0 standard drinks    Comment: none for 3 yrs  . Drug use: No    Types: Marijuana, Cocaine, Heroin, Oxycodone    Comment: yr straight  . Sexual activity: Not Currently    Birth  control/protection: None  Lifestyle  . Physical activity    Days per week: Not on file    Minutes per session: Not on file  . Stress: Not on file  Relationships  . Social Musicianconnections    Talks on phone: Not on file    Gets together: Not on file    Attends religious service: Not on file    Active member of club or organization: Not on file    Attends meetings of clubs or organizations: Not on file    Relationship status: Not on file  Other Topics Concern  . Not on file  Social History Narrative  . Not on file   Additional Social History:    Allergies:   Allergies  Allergen Reactions  . Clonidine Derivatives Other (See Comments)    The patch causes seizures  . Paxil [Paroxetine Hcl]   . Simvastatin   . Sulfa Drugs Cross Reactors Other (See Comments)    unknown    Labs:  Results for orders placed or performed during the hospital encounter of 03/31/19 (from the past 48 hour(s))  CBC with Differential     Status: Abnormal   Collection Time: 03/31/19  8:54 PM  Result Value Ref Range   WBC 28.0 (H) 4.0 - 10.5 K/uL   RBC 4.31 3.87 - 5.11 MIL/uL   Hemoglobin 13.7 12.0 - 15.0 g/dL   HCT 40.939.4 81.136.0 - 91.446.0 %   MCV 91.4 80.0 - 100.0 fL   MCH 31.8 26.0 - 34.0 pg   MCHC 34.8  30.0 - 36.0 g/dL   RDW 78.212.3 95.611.5 - 21.315.5 %   Platelets 259 150 - 400 K/uL   nRBC 0.0 0.0 - 0.2 %   Neutrophils Relative % 83 %   Neutro Abs 23.7 (H) 1.7 - 7.7 K/uL   Lymphocytes Relative 7 %   Lymphs Abs 1.9 0.7 - 4.0 K/uL   Monocytes Relative 8 %   Monocytes Absolute 2.1 (H) 0.1 - 1.0 K/uL   Eosinophils Relative 0 %   Eosinophils Absolute 0.1 0.0 - 0.5 K/uL   Basophils Relative 1 %   Basophils Absolute 0.2 (H) 0.0 - 0.1 K/uL   Immature Granulocytes 1 %   Abs Immature Granulocytes 0.20 (H) 0.00 - 0.07 K/uL    Comment: Performed at [facility], [address] [place], [place] [address]  Comprehensive metabolic panel     Status: Abnormal   Collection Time: 03/31/19  8:54 PM  Result Value Ref Range   Sodium 139 135 - 145 mmol/L   Potassium 3.8 3.5 - 5.1 mmol/L   Chloride 106 98 - 111 mmol/L   CO2 20 (L) 22 - 32 mmol/L   Glucose, Bld 89 70 - 99 mg/dL   BUN 28 (H) 6 - 20 mg/dL   Creatinine, Ser 8.460.99 0.44 - 1.00 mg/dL   Calcium 9.5 8.9 - 96.210.3 mg/dL   Total Protein 7.9 6.5 - 8.1 g/dL   Albumin 4.7 3.5 - 5.0 g/dL   AST 32 15 - 41 U/L   ALT 20 0 - 44 U/L   Alkaline Phosphatase 111 38 - 126 U/L   Total Bilirubin 0.7 0.3 - 1.2 mg/dL   GFR calc non Af Amer >60 >60 mL/min   GFR calc Af Amer >60 >60 mL/min   Anion gap 13 5 - 15    Comment: Performed at [facility], [address] [place], [place] [address]  Urinalysis, Complete w Microscopic     Status:  Abnormal   Collection Time: 03/31/19  8:54 PM  Result Value Ref Range   Color, Urine AMBER (A) YELLOW    Comment: BIOCHEMICALS MAY BE AFFECTED BY COLOR   APPearance HAZY (A) CLEAR   Specific Gravity, Urine 1.029 1.005 - 1.030   pH 5.0 5.0 - 8.0   Glucose, UA NEGATIVE NEGATIVE mg/dL   Hgb urine dipstick SMALL (A) NEGATIVE   Bilirubin Urine NEGATIVE NEGATIVE   Ketones, ur 20 (A) NEGATIVE mg/dL   Protein, ur 409 (A) NEGATIVE mg/dL   Nitrite NEGATIVE NEGATIVE   Leukocytes,Ua NEGATIVE NEGATIVE   RBC / HPF 0-5 0  - 5 RBC/hpf   WBC, UA 0-5 0 - 5 WBC/hpf   Bacteria, UA NONE SEEN NONE SEEN   Squamous Epithelial / LPF 0-5 0 - 5   Mucus PRESENT    Hyaline Casts, UA PRESENT     Comment: Performed at [facility], [address] [place], [place] [address]  Urine Drug Screen, Qualitative ([facility] only)     Status: Abnormal   Collection Time: 03/31/19  8:54 PM  Result Value Ref Range   Tricyclic, Ur Screen POSITIVE (A) NONE DETECTED   Amphetamines, Ur Screen POSITIVE (A) NONE DETECTED   MDMA (Ecstasy)Ur Screen NONE DETECTED NONE DETECTED   Cocaine Metabolite,Ur [place] POSITIVE (A) NONE DETECTED   Opiate, Ur Screen POSITIVE (A) NONE DETECTED   Phencyclidine (PCP) Ur S NONE DETECTED NONE DETECTED   Cannabinoid 50 Ng, Ur [place] NONE DETECTED NONE DETECTED   Barbiturates, Ur Screen POSITIVE (A) NONE DETECTED   Benzodiazepine, Ur Scrn POSITIVE (A) NONE DETECTED   Methadone Scn, Ur NONE DETECTED NONE DETECTED    Comment: (NOTE) Tricyclics + metabolites, urine    Cutoff 1000 ng/mL Amphetamines + metabolites, urine  Cutoff 1000 ng/mL MDMA (Ecstasy), urine              Cutoff 500 ng/mL Cocaine Metabolite, urine          Cutoff 300 ng/mL Opiate + metabolites, urine        Cutoff 300 ng/mL Phencyclidine (PCP), urine         Cutoff 25 ng/mL Cannabinoid, urine                 Cutoff 50 ng/mL Barbiturates + metabolites, urine  Cutoff 200 ng/mL Benzodiazepine, urine              Cutoff 200 ng/mL Methadone, urine                   Cutoff 300 ng/mL The urine drug screen provides only a preliminary, unconfirmed analytical test result and should not be used for non-medical purposes. Clinical consideration and professional judgment should be applied to any positive drug screen result due to possible interfering substances. A more specific alternate chemical method must be used in order to obtain a confirmed analytical result. Gas chromatography / mass spectrometry (GC/MS) is the preferred confirmat ory  method. Performed at [facility], [address] [place], [place] [address]   Ethanol     Status: None   Collection Time: 03/31/19  8:54 PM  Result Value Ref Range   Alcohol, Ethyl (B) <10 <10 mg/dL    Comment: (NOTE) Lowest detectable limit for serum alcohol is 10 mg/dL. For medical purposes only. Performed at [facility], [address] [place], [place] [address]   SARS Coronavirus 2 (CEPHEID- Performed in [facility] hospital lab), [facility] Order     Status:  None   Collection Time: 03/31/19 11:45 PM   Specimen: Nasopharyngeal Swab  Result Value Ref Range   SARS Coronavirus 2 NEGATIVE NEGATIVE    Comment: (NOTE) If result is NEGATIVE SARS-CoV-2 target nucleic acids are NOT DETECTED. The SARS-CoV-2 RNA is generally detectable in upper and lower  respiratory specimens during the acute phase of infection. The lowest  concentration of SARS-CoV-2 viral copies this assay can detect is 250  copies / mL. A negative result does not preclude SARS-CoV-2 infection  and should not be used as the sole basis for treatment or other  patient management decisions.  A negative result may occur with  improper specimen collection / handling, submission of specimen other  than nasopharyngeal swab, presence of viral mutation(s) within the  areas targeted by this assay, and inadequate number of viral copies  (<250 copies / mL). A negative result must be combined with clinical  observations, patient history, and epidemiological information. If result is POSITIVE SARS-CoV-2 target nucleic acids are DETECTED. The SARS-CoV-2 RNA is generally detectable in upper and lower  respiratory specimens dur ing the acute phase of infection.  Positive  results are indicative of active infection with SARS-CoV-2.  Clinical  correlation with patient history and other diagnostic information is  necessary to determine patient infection status.  Positive results do  not rule out bacterial infection  or co-infection with other viruses. If result is PRESUMPTIVE POSTIVE SARS-CoV-2 nucleic acids MAY BE PRESENT.   A presumptive positive result was obtained on the submitted specimen  and confirmed on repeat testing.  While 2019 novel coronavirus  (SARS-CoV-2) nucleic acids may be present in the submitted sample  additional confirmatory testing may be necessary for epidemiological  and / or clinical management purposes  to differentiate between  SARS-CoV-2 and other Sarbecovirus currently known to infect humans.  If clinically indicated additional testing with an alternate test  methodology [phone]) is advised. The SARS-CoV-2 RNA is generally  detectable in upper and lower respiratory sp ecimens during the acute  phase of infection. The expected result is Negative. Fact Sheet for Patients:  BoilerBrush.com.cy Fact Sheet for Healthcare Providers: https://pope.com/ This test is not yet approved or cleared by the [place] FDA and has been authorized for detection and/or diagnosis of SARS-CoV-2 by FDA under an Emergency Use Authorization (EUA).  This EUA will remain in effect (meaning this test can be used) for the duration of the COVID-19 declaration under Section 564(b)(1) of the Act, 21 U.S.C. section 360bbb-3(b)(1), unless the authorization is terminated or revoked sooner. Performed at [facility], [address] [place], [place] [address]   CBC with Differential/Platelet     Status: Abnormal   Collection Time: 03/31/19 11:59 PM  Result Value Ref Range   WBC 18.2 (H) 4.0 - 10.5 K/uL   RBC 3.87 3.87 - 5.11 MIL/uL   Hemoglobin 12.4 12.0 - 15.0 g/dL   HCT 81.1 91.4 - 78.2 %   MCV 93.8 80.0 - 100.0 fL   MCH 32.0 26.0 - 34.0 pg   MCHC 34.2 30.0 - 36.0 g/dL   RDW 95.6 21.3 - 08.6 %   Platelets 205 150 - 400 K/uL   nRBC 0.0 0.0 - 0.2 %   Neutrophils Relative % 87 %   Neutro Abs 15.9 (H) 1.7 - 7.7 K/uL   Lymphocytes  Relative 6 %   Lymphs Abs 1.2 0.7 - 4.0 K/uL   Monocytes Relative 6 %   Monocytes Absolute 1.0 0.1 - 1.0 K/uL  Eosinophils Relative 0 %   Eosinophils Absolute 0.0 0.0 - 0.5 K/uL   Basophils Relative 0 %   Basophils Absolute 0.1 0.0 - 0.1 K/uL   Immature Granulocytes 1 %   Abs Immature Granulocytes 0.11 (H) 0.00 - 0.07 K/uL    Comment: Performed at [facility], [address] [place], [place] [address]    No current facility-administered medications for this encounter.    Current Outpatient Medications  Medication Sig Dispense Refill  . albuterol (PROVENTIL HFA;VENTOLIN HFA) 108 (90 Base) MCG/ACT inhaler Inhale into the lungs.    . butalbital-acetaminophen-caffeine (FIORICET, ESGIC) 50-325-40 MG tablet Take 1 tablet by mouth every 6 (six) hours as needed.     . Cyanocobalamin (VITAMIN B-12) 5000 MCG TBDP Take by mouth.    . cyclobenzaprine (FLEXERIL) 10 MG tablet   4  . diazepam (VALIUM) 10 MG tablet   0  . escitalopram (LEXAPRO) 10 MG tablet   1  . gabapentin (NEURONTIN) 800 MG tablet Take 800 mg by mouth 3 (three) times daily.    Marland Kitchen OLANZapine (ZYPREXA) 5 MG tablet Take 1 tablet (5 mg total) by mouth at bedtime. 30 tablet 2  . ondansetron (ZOFRAN) 8 MG tablet   4  . pantoprazole (PROTONIX) 40 MG tablet   1  . promethazine (PHENERGAN) 25 MG tablet   4  . QUEtiapine (SEROQUEL) 25 MG tablet   4  . sertraline (ZOLOFT) 100 MG tablet Take 150 mg by mouth.    . traZODone (DESYREL) 100 MG tablet Take 100 mg by mouth at bedtime.      Musculoskeletal: Strength & Muscle Tone: decreased Gait & Station: unsteady Patient leans: N/A  Psychiatric Specialty Exam: Physical Exam  Nursing note and vitals reviewed. Constitutional: She appears well-developed and well-nourished.  HENT:  Head: Normocephalic.  Eyes: Pupils are equal, round, and reactive to light. Conjunctivae are normal.  Neck: Normal range of motion. Neck supple.  Cardiovascular: Normal rate and regular rhythm.   Respiratory: Effort normal.  Musculoskeletal: Normal range of motion.  Neurological: She is alert.  Skin: Skin is warm and dry.    Review of Systems  Respiratory: Positive for cough.   Neurological: Positive for loss of consciousness.  Psychiatric/Behavioral: Positive for depression, hallucinations and substance abuse. The patient is nervous/anxious and has insomnia.   All other systems reviewed and are negative.   Blood pressure 113/76, pulse 86, temperature 98.5 F (36.9 C), temperature source Oral, resp. rate 16, height  (1.753 m), weight 53.1 kg, SpO2 95 %.Body mass index is 17.28 kg/m.  General Appearance: Disheveled  Eye Contact:  Poor  Speech:  Garbled and Pressured  Volume:  Increased  Mood:  Anxious and Depressed  Affect:  Inappropriate  Thought Process:  Irrelevant and Linear  Orientation:  Full (Time, Place, and Person)  Thought Content:  Rumination  Suicidal Thoughts:  Unable to assess.  Homicidal Thoughts:  Un able to assess  Memory:  Immediate;   Fair Recent;   Fair  Judgement:  Impaired  Insight:  Lacking  Psychomotor Activity:  Decreased  Concentration:  Concentration: Poor and Attention Span: Poor  Recall:  Poor  Fund of Knowledge:  Fair  Language:  Poor  Akathisia:  Negative  Handed:  Right  AIMS (if indicated):     Assets:  Communication Skills Desire for Improvement Physical Health  ADL's:  Impaired  Cognition:  WNL  Sleep:   Patient unable to voiced     Treatment Plan Summary: Daily  contact with patient to assess and evaluate symptoms and progress in treatment, Medication management and Plan Patient does meet criteria to be admitted to the psychiatric inpatient admission.  Disposition: Recommend psychiatric Inpatient admission when medically cleared. Supportive therapy provided about ongoing stressors. Patient does meet criteria for psychiatric inpatient admission for treatment and stabilization.  Lamont Dowdy, NP 04/01/2019 3:42  AM

## 2019-04-02 DIAGNOSIS — F1994 Other psychoactive substance use, unspecified with psychoactive substance-induced mood disorder: Secondary | ICD-10-CM

## 2019-04-02 DIAGNOSIS — F1110 Opioid abuse, uncomplicated: Secondary | ICD-10-CM

## 2019-04-02 DIAGNOSIS — F1510 Other stimulant abuse, uncomplicated: Secondary | ICD-10-CM

## 2019-04-02 MED ORDER — DIAZEPAM 5 MG PO TABS
5.0000 mg | ORAL_TABLET | Freq: Two times a day (BID) | ORAL | Status: DC
  Administered 2019-04-02: 5 mg via ORAL
  Filled 2019-04-02: qty 1

## 2019-04-02 MED ORDER — GABAPENTIN 400 MG PO CAPS: 800.0000 mg | ORAL_CAPSULE | Freq: Three times a day (TID) | ORAL | Status: DC

## 2019-04-02 MED ORDER — ESCITALOPRAM OXALATE 10 MG PO TABS: 20.0000 mg | ORAL_TABLET | Freq: Every day | ORAL | Status: DC

## 2019-04-02 NOTE — Progress Notes (Signed)
D: Patient is aware of  Discharge this shift .Patient denies suicidal /homicidal ideations. Patient received all belongings brought in   A: No Storage medications. Writer reviewed Discharge Summary, Suicide Risk Assessment, and Transitional Record.. Aware  Of follow up appointment .  R: Patient left unit with no questions  Or concerns  With mother  

## 2019-04-02 NOTE — Progress Notes (Signed)
D - Patient was in her room upon arrival to the unit. Patient was pleasant during assessment and medication administration. Patient was drug seeking wanting to know why she couldn't be on ativan and Valium. Patient given education. Patient denies SI/HI/AVH and depression. Patient endorses anxiety but couldn't assign a number to it.   A - Patient compliant with medication administration per MD orders and procedures on the unit. Patient was isolative to her room other than take her medications. Patient given education. Patient given support and encouragement to be active in her treatment plan. Patient informed to let staff know if there are any issues or problems on the unit.   R - Patient being monitored Q 15 minutes for safety per unit protocol. Patient remains safe on the unit.

## 2019-04-02 NOTE — [facility] Suicide Risk Assessment (Signed)
[facility] INPATIENT:  Family/Significant Other Suicide Prevention Education  Suicide Prevention Education:  Patient Refusal for Family/Significant Other Suicide Prevention Education: The patient Erin [name] has refused to provide written consent for family/significant other to be provided Family/Significant Other Suicide Prevention Education during admission and/or prior to discharge.  Physician notified.  SPE completed with pt, as pt refused to consent to family contact. SPI pamphlet provided to pt and pt was encouraged to share information with support network, ask questions, and talk about any concerns relating to SPE. Pt denies access to guns/firearms and verbalized understanding of information provided. Mobile Crisis information also provided to pt.    [place] MSW LCSW 04/02/2019, 9:06 AM

## 2019-04-02 NOTE — Plan of Care (Signed)
Patient newly admitted, hasn't had time to progress.   Problem: Education: Goal: Knowledge of [place] [facility] Education information/materials will improve Outcome: Not Progressing Goal: Emotional status will improve Outcome: Not Progressing Goal: Mental status will improve Outcome: Not Progressing Goal: Verbalization of understanding the information provided will improve Outcome: Not Progressing   Problem: Coping: Goal: Coping ability will improve Outcome: Not Progressing Goal: Will verbalize feelings Outcome: Not Progressing   Problem: Education: Goal: Ability to state activities that reduce stress will improve Outcome: Not Progressing

## 2019-04-02 NOTE — Discharge Summary (Signed)
Physician Discharge Summary Note  Patient:  Erin [name] is an 48 y.o., female MRN:  [id number] DOB:  1971-02-16 Patient phone:  [phone] (home)  Patient address:   [address] [place] [id number],  Total Time spent with patient: 45 minutes  Date of Admission:  04/01/2019 Date of Discharge: April 02, 2019  Reason for Admission: Patient was admitted through the emergency room where she presented with reports of anxiety depression poor sleep poor appetite and poor self-care in the context of drug abuse  Principal Problem: Substance induced mood disorder [facility](HCC) Discharge Diagnoses: Principal Problem:   Substance induced mood disorder (HCC) Active Problems:   Amphetamine abuse (HCC)   Opiate abuse, continuous (HCC)   Past Psychiatric History: Patient has a history of mood instability.  Has been diagnosed at times in the past with bipolar disorder.  Review of charting and interview with the patient suggest that major mood symptoms and psychiatric problems have almost entirely been associated with active substance abuse.  Long history of substance dependence including problems with alcohol benzodiazepines and narcotics.  Patient currently has an outpatient psychiatrist and is taking Lexapro and Valium daily.  Not on any other psychiatric medicines currently.  She reports 1 prior suicide attempt years ago.  No clear history of psychosis that I can see from the chart.  Past Medical History:  Past Medical History:  Diagnosis Date  . Anxiety disorder   . Back pain, chronic   . Bipolar disorder (HCC)   . Cellulitis   . Cervicalgia   . Chicken pox   . Chronic back pain   . Depression   . Drug abuse (HCC)   . GERD (gastroesophageal reflux disease)   . Hx of migraines   . Hyperlipidemia   . Insomnia   . Migraine   . Nicotine dependence   . Seizure (HCC) unknown  . Seizures (HCC)    pt had 1 seizure October 2012- no other history of seizures  . Vitamin D deficiency     Past  Surgical History:  Procedure Laterality Date  . ABDOMINAL HYSTERECTOMY    . BREAST SURGERY    . CESAREAN SECTION    . CHOLECYSTECTOMY    . ESOPHAGOGASTRODUODENOSCOPY    . ESOPHAGOGASTRODUODENOSCOPY (EGD) WITH PROPOFOL N/A 09/23/2018   Procedure: ESOPHAGOGASTRODUODENOSCOPY (EGD) WITH PROPOFOL;  Surgeon: Toney ReilVanga, Rohini Reddy, MD;  [facility];  Service: Gastroenterology;  Laterality: N/A;   Family History:  Family History  Problem Relation Age of Onset  . Diabetes Mother   . Depression Mother   . Anxiety disorder Mother   . Alcohol abuse Father   . Asthma Father   . COPD Father   . Early death Father   . Bipolar disorder Father   . Heart failure Sister        from chemotherapy   . Arthritis Maternal Grandmother   . Stroke Maternal Grandmother   . Pancreatic cancer Maternal Grandfather   . Anxiety disorder Maternal Grandfather   . Depression Maternal Grandfather   . Bipolar disorder Paternal Uncle   . Bipolar disorder Cousin    Family Psychiatric  History: Patient has reported multiple people with anxiety and mood symptoms in her immediate family Social History:  Social History   Substance and Sexual Activity  Alcohol Use No  . Alcohol/week: 0.0 standard drinks   Comment: none for 3 yrs     Social History   Substance and Sexual Activity  Drug Use No  . Types: Marijuana,  Cocaine, Heroin, Oxycodone   Comment: yr straight    Social History   Socioeconomic History  . Marital status: Single    Spouse name: Not on file  . Number of children: Not on file  . Years of education: Not on file  . Highest education level: Not on file  Occupational History  . Not on file  Social Needs  . Financial resource strain: Not on file  . Food insecurity    Worry: Not on file    Inability: Not on file  . Transportation needs    Medical: Not on file    Non-medical: Not on file  Tobacco Use  . Smoking status: Current Every Day Smoker    Packs/day: 0.50    Years:  25.00    Pack years: 12.50    Types: Cigarettes  . Smokeless tobacco: Never Used  . Tobacco comment: currently using a vapor as well.   Substance and Sexual Activity  . Alcohol use: No    Alcohol/week: 0.0 standard drinks    Comment: none for 3 yrs  . Drug use: No    Types: Marijuana, Cocaine, Heroin, Oxycodone    Comment: yr straight  . Sexual activity: Not Currently    Birth control/protection: None  Lifestyle  . Physical activity    Days per week: Not on file    Minutes per session: Not on file  . Stress: Not on file  Relationships  . Social Herbalist on phone: Not on file    Gets together: Not on file    Attends religious service: Not on file    Active member of club or organization: Not on file    Attends meetings of clubs or organizations: Not on file    Relationship status: Not on file  Other Topics Concern  . Not on file  Social History Narrative  . Not on file    Hospital Course: Admitted to the psychiatric ward.  15-minute checks in place.  Patient did not display any dangerous aggressive or violent behavior.  Denied suicidal ideation.  Took care of her ADLs without difficulty.  Patient appropriately asked for resumption of medicine such as what she had been taking previously.  Vital signs stable.  No sign of acute withdrawal.  No delirium.  On interview the patient continues to deny suicidal ideation.  Appears to be capable of making her own decisions.  At this point no evidence of dangerousness that would make her meet commitment criteria.  Patient was counseled about the dangers including potential fatality of abuse of sedatives especially mixing things such as narcotics benzodiazepines and barbiturates.  She was strongly encouraged to speak with her primary psychiatrist about this in context of her medicine.  Patient is preferring to be discharged at this time stating that she knows she can stay with her mother or go to a detox program in [place] that she had  gone to before.  No longer meets commitment criteria.  Patient will be discharged from the hospital.  Case reviewed with nursing and social work.  No new prescriptions.  Physical Findings: AIMS:  , ,  ,  ,    CIWA:  CIWA-Ar Total: 3 COWS:     Musculoskeletal: Strength & Muscle Tone: within normal limits Gait & Station: normal Patient leans: N/A  Psychiatric Specialty Exam: Physical Exam  Nursing note and vitals reviewed. Constitutional: She appears well-developed and well-nourished.  HENT:  Head: Normocephalic and atraumatic.  Eyes: Pupils are equal,  round, and reactive to light. Conjunctivae are normal.  Neck: Normal range of motion.  Cardiovascular: Regular rhythm and normal heart sounds.  Respiratory: Effort normal.  GI: Soft.  Musculoskeletal: Normal range of motion.  Neurological: She is alert.  Skin: Skin is warm and dry.  Psychiatric: Her speech is normal and behavior is normal. Thought content normal. Her mood appears anxious. She is not agitated. Cognition and memory are normal. She expresses impulsivity.    Review of Systems  Constitutional: Negative.   HENT: Negative.   Eyes: Negative.   Respiratory: Negative.   Cardiovascular: Negative.   Gastrointestinal: Negative.   Musculoskeletal: Negative.   Skin: Negative.   Neurological: Negative.   Psychiatric/Behavioral: Positive for substance abuse. Negative for depression, hallucinations, memory loss and suicidal ideas. The patient is nervous/anxious and has insomnia.     Blood pressure 110/81, pulse 76, temperature 97.7 F (36.5 C), temperature source Oral, resp. rate 18, height 5\' 9"  (1.753 m), weight 48.1 kg, SpO2 100 %.Body mass index is 15.65 kg/m.  General Appearance: Casual  Eye Contact:  Good  Speech:  Clear and Coherent  Volume:  Normal  Mood:  Anxious  Affect:  Congruent  Thought Process:  Goal Directed  Orientation:  Full (Time, Place, and Person)  Thought Content:  Logical  Suicidal Thoughts:  No   Homicidal Thoughts:  No  Memory:  Immediate;   Fair Recent;   Fair Remote;   Fair  Judgement:  Fair  Insight:  Fair  Psychomotor Activity:  Normal  Concentration:  Concentration: Fair  Recall:  [employer]Fair  Fund of Knowledge:  Fair  Language:  Fair  Akathisia:  No  Handed:  Right  AIMS (if indicated):     Assets:  Desire for Improvement Housing Resilience Social Support  ADL's:  Intact  Cognition:  WNL  Sleep:  Number of Hours: 8        Has this patient used any form of tobacco in the last 30 days? (Cigarettes, Smokeless Tobacco, Cigars, and/or Pipes) Yes, Yes, A prescription for an FDA-approved tobacco cessation medication was offered at discharge and the patient refused  Blood Alcohol level:  Lab Results  Component Value Date   [facility]   ETH <11 03/24/2013    Metabolic Disorder Labs:  No results found for: HGBA1C, MPG No results found for: PROLACTIN Lab Results  Component Value Date   CHOL 207 (H) 11/23/2008   TRIG 242 (H) 11/23/2008    See Psychiatric Specialty Exam and Suicide Risk Assessment completed by Attending Physician prior to discharge.  Discharge destination:  Home  Is patient on multiple antipsychotic therapies at discharge:  No   Has Patient had three or more failed trials of antipsychotic monotherapy by history:  No  Recommended Plan for Multiple Antipsychotic Therapies: NA  Discharge Instructions    Diet - low sodium heart healthy   Complete by: As directed    Increase activity slowly   Complete by: As directed      Allergies as of 04/02/2019      Reactions   Clonidine Derivatives Other (See Comments)   The patch causes seizures   Paxil [paroxetine Hcl]    Simvastatin    Sulfa Drugs Cross Reactors Other (See Comments)   unknown      Medication List    STOP taking these medications   OLANZapine 5 MG tablet Commonly known as: ZYPREXA   ondansetron 8 MG tablet Commonly known as: ZOFRAN   promethazine 25 MG  tablet Commonly known as: PHENERGAN   QUEtiapine 25 MG tablet Commonly known as: SEROQUEL   sertraline 100 MG tablet Commonly known as: ZOLOFT   Vitamin B-12 5000 MCG Tbdp     TAKE these medications     Indication  albuterol 108 (90 Base) MCG/ACT inhaler Commonly known as: VENTOLIN HFA Inhale into the lungs.  Indication: Asthma   butalbital-acetaminophen-caffeine 50-325-40 MG tablet Commonly known as: FIORICET Take 1 tablet by mouth every 6 (six) hours as needed.  Indication: Tension Headache   cyclobenzaprine 10 MG tablet Commonly known as: FLEXERIL  Indication: Muscle Spasm   diazepam 10 MG tablet Commonly known as: VALIUM  Indication: Feeling Anxious   escitalopram 10 MG tablet Commonly known as: LEXAPRO  Indication: Generalized Anxiety Disorder   gabapentin 800 MG tablet Commonly known as: NEURONTIN Take 800 mg by mouth 3 (three) times daily.  Indication: Fibromyalgia Syndrome   pantoprazole 40 MG tablet Commonly known as: PROTONIX  Indication: Gastroesophageal Reflux Disease   traZODone 100 MG tablet Commonly known as: DESYREL Take 100 mg by mouth at bedtime.  Indication: Trouble Sleeping      Follow-up Information    [facility] Follow up on 04/02/2019.   Why: Please [facility] on  Contact information: [address] [place] [address] [phone]           Follow-up recommendations:  Activity:  Activity as tolerated Diet:  Regular diet Other:  Patient has been educated about the dangers of abuse of drugs particularly combinations of sedating medicines.  She is strongly encouraged to follow-up with outpatient substance abuse treatment and has stated a plan to do so.  No new prescriptions written.  Comments: At this point does not appear to be acutely dangerous.  No evidence of suicidality.  Agrees to appropriate outpatient treatment.  Signed: Mordecai RasmussenJohn Clapacs, MD 04/02/2019, 1:58 PM
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
## 2019-04-02 NOTE — Plan of Care (Signed)
  Problem: Education: Goal: Knowledge of [place] [facility] Education information/materials will improve Outcome: Adequate for Discharge Goal: Emotional status will improve Outcome: Adequate for Discharge Goal: Mental status will improve Outcome: Adequate for Discharge Goal: Verbalization of understanding the information provided will improve Outcome: Adequate for Discharge   Problem: Coping: Goal: Coping ability will improve Outcome: Adequate for Discharge Goal: Will verbalize feelings Outcome: Adequate for Discharge   Problem: Education: Goal: Ability to state activities that reduce stress will improve Outcome: Adequate for Discharge   Problem: Education: Goal: Understanding of discharge needs will improve Outcome: Adequate for Discharge   Problem: Safety: Goal: Ability to remain free from injury will improve Outcome: Adequate for Discharge

## 2019-04-02 NOTE — H&P (Signed)
Psychiatric Admission Assessment Adult  Patient Identification: Erin HongSheri L Blodgett MRN:  [id number] Date of Evaluation:  04/02/2019 Chief Complaint:  Bipolar 1 Disorder, manic, moderate Principal Diagnosis: Substance induced mood disorder (HCC) Diagnosis:  Principal Problem:   Substance induced mood disorder (HCC) Active Problems:   Amphetamine abuse (HCC)   Opiate abuse, continuous (HCC)  History of Present Illness: Patient seen and chart reviewed.  Patient with a history of polysubstance abuse came to the emergency room after law enforcement was called to a domestic dispute she was having with her boyfriend.  Patient has denied any suicidal thoughts and continues to deny suicidal thoughts.  Denies homicidal ideation.  Denies any psychotic symptoms.  Patient states that she and her boyfriend were breaking up and that she was angry and upset.  Denies that she was in any way seriously trying to harm him.  Patient says her mood recently has been bad because she has been out of work due to the coronavirus situation.  Sleep has been particularly bad for the last several days and she has not eaten well for the past several days.  This is related to the fact that she began abusing Adderall several days ago.  She says that she had never previously abused amphetamines but was using Adderall at doses up to 100 mg or more per day for several days and also was using cocaine.  This is in addition to what she says is her daily use of nonprescribed narcotics which has been present for a long time.  This is on top of her prescribed regular use of benzodiazepines and barbiturates.  Denies alcohol abuse.  She does have an outpatient psychiatrist in the [facility] [place] whom she sees every several months. Associated Signs/Symptoms: Depression Symptoms:  insomnia, anxiety, (Hypo) Manic Symptoms:  Distractibility, Anxiety Symptoms:  Excessive Worry, Psychotic Symptoms:  None reported PTSD Symptoms: Negative Total  Time spent with patient: 1 hour  Past Psychiatric History: Patient has a past history of diagnoses with bipolar disorder although looking through the chart it does not appear that she is ever had a manic episode that was clearly meeting criteria for mania or that was not related to immediate substance intoxication.  Mostly seems to have chronic anxiety and mood lability.  Unclear if she has had much sobriety in the past to be able to judge it.  Previous hospitalizations in the context of substance abuse.  Currently is being prescribed Lexapro and Valium by her outpatient psychiatrist.  1 history of suicide attempt by overdose several years ago.  Is the patient at risk to self? No.  Has the patient been a risk to self in the past 6 months? No.  Has the patient been a risk to self within the distant past? Yes.    Is the patient a risk to others? No.  Has the patient been a risk to others in the past 6 months? No.  Has the patient been a risk to others within the distant past? No.   Prior Inpatient Therapy:   Prior Outpatient Therapy:    Alcohol Screening: 1. How often do you have a drink containing alcohol?: 2 to 3 times a week 2. How many drinks containing alcohol do you have on a typical day when you are drinking?: 5 or 6 3. How often do you have six or more drinks on one occasion?: Less than monthly AUDIT-C Score: 6 4. How often during the last year have you found that you were not  able to stop drinking once you had started?: Less than monthly 5. How often during the last year have you failed to do what was normally expected from you becasue of drinking?: Less than monthly 6. How often during the last year have you needed a first drink in the morning to get yourself going after a heavy drinking session?: Less than monthly 7. How often during the last year have you had a feeling of guilt of remorse after drinking?: Less than monthly 8. How often during the last year have you been unable to  remember what happened the night before because you had been drinking?: Less than monthly 9. Have you or someone else been injured as a result of your drinking?: Yes, but not in the last year 10. Has a relative or friend or a doctor or another health worker been concerned about your drinking or suggested you cut down?: Yes, but not in the last year Alcohol Use Disorder Identification Test Final Score (AUDIT): 15 Alcohol Brief Interventions/Follow-up: Alcohol Education Substance Abuse History in the last 12 months:  Yes.   Consequences of Substance Abuse: Medical Consequences:  Weight loss.  Poor self-care.  Mood instability. Previous Psychotropic Medications: Yes  Psychological Evaluations: Yes  Past Medical History:  Past Medical History:  Diagnosis Date  . Anxiety disorder   . Back pain, chronic   . Bipolar disorder (HCC)   . Cellulitis   . Cervicalgia   . Chicken pox   . Chronic back pain   . Depression   . Drug abuse (HCC)   . GERD (gastroesophageal reflux disease)   . Hx of migraines   . Hyperlipidemia   . Insomnia   . Migraine   . Nicotine dependence   . Seizure (HCC) unknown  . Seizures (HCC)    pt had 1 seizure October 2012- no other history of seizures  . Vitamin D deficiency     Past Surgical History:  Procedure Laterality Date  . ABDOMINAL HYSTERECTOMY    . BREAST SURGERY    . CESAREAN SECTION    . CHOLECYSTECTOMY    . ESOPHAGOGASTRODUODENOSCOPY    . ESOPHAGOGASTRODUODENOSCOPY (EGD) WITH PROPOFOL N/A 09/23/2018   Procedure: ESOPHAGOGASTRODUODENOSCOPY (EGD) WITH PROPOFOL;  Surgeon: Toney Reil, MD;  Location: [facility] ENDOSCOPY;  Service: Gastroenterology;  Laterality: N/A;   Family History:  Family History  Problem Relation Age of Onset  . Diabetes Mother   . Depression Mother   . Anxiety disorder Mother   . Alcohol abuse Father   . Asthma Father   . COPD Father   . Early death Father   . Bipolar disorder Father   . Heart failure Sister         from chemotherapy   . Arthritis Maternal Grandmother   . Stroke Maternal Grandmother   . Pancreatic cancer Maternal Grandfather   . Anxiety disorder Maternal Grandfather   . Depression Maternal Grandfather   . Bipolar disorder Paternal Uncle   . Bipolar disorder Cousin    Family Psychiatric  History: Reports a history of anxiety and mood symptoms in fairly close relatives including bipolar disorder and father Tobacco Screening:   Social History:  Social History   Substance and Sexual Activity  Alcohol Use No  . Alcohol/week: 0.0 standard drinks   Comment: none for 3 yrs     Social History   Substance and Sexual Activity  Drug Use No  . Types: Marijuana, Cocaine, Heroin, Oxycodone   Comment: yr straight  Additional Social History: Marital status: Long term relationship Long term relationship, how long?: Pt reports that she's been with her boyfriend for 20 years, but they broke up for five years and recently got back together. Additional relationship information: Pt reports leaving her boyfriend and moving into her own apartment at discharge Are you sexually active?: Yes What is your sexual orientation?: heterosexual Has your sexual activity been affected by drugs, alcohol, medication, or emotional stress?: pt denies Does patient have children?: Yes How many children?: 1 How is patient's relationship with their children?: 11 yo daughter who pt reports a "good" relationship with                         Allergies:   Allergies  Allergen Reactions  . Clonidine Derivatives Other (See Comments)    The patch causes seizures  . Paxil [Paroxetine Hcl]   . Simvastatin   . Sulfa Drugs Cross Reactors Other (See Comments)    unknown   Lab Results:  Results for orders placed or performed during the hospital encounter of 03/31/19 (from the past 48 hour(s))  CBC with Differential     Status: Abnormal   Collection Time: 03/31/19  8:54 PM  Result Value Ref Range   WBC  28.0 (H) 4.0 - 10.5 K/uL   RBC 4.31 3.87 - 5.11 MIL/uL   Hemoglobin 13.7 12.0 - 15.0 g/dL   HCT 16.1 09.6 - 04.5 %   MCV 91.4 80.0 - 100.0 fL   MCH 31.8 26.0 - 34.0 pg   MCHC 34.8 30.0 - 36.0 g/dL   RDW 40.9 81.1 - 91.4 %   Platelets 259 150 - 400 K/uL   nRBC 0.0 0.0 - 0.2 %   Neutrophils Relative % 83 %   Neutro Abs 23.7 (H) 1.7 - 7.7 K/uL   Lymphocytes Relative 7 %   Lymphs Abs 1.9 0.7 - 4.0 K/uL   Monocytes Relative 8 %   Monocytes Absolute 2.1 (H) 0.1 - 1.0 K/uL   Eosinophils Relative 0 %   Eosinophils Absolute 0.1 0.0 - 0.5 K/uL   Basophils Relative 1 %   Basophils Absolute 0.2 (H) 0.0 - 0.1 K/uL   Immature Granulocytes 1 %   Abs Immature Granulocytes 0.20 (H) 0.00 - 0.07 K/uL    Comment: Performed at [facility], [address] [place], [place] [address]  Comprehensive metabolic panel     Status: Abnormal   Collection Time: 03/31/19  8:54 PM  Result Value Ref Range   Sodium 139 135 - 145 mmol/L   Potassium 3.8 3.5 - 5.1 mmol/L   Chloride 106 98 - 111 mmol/L   CO2 20 (L) 22 - 32 mmol/L   Glucose, Bld 89 70 - 99 mg/dL   BUN 28 (H) 6 - 20 mg/dL   Creatinine, Ser 6.21 0.44 - 1.00 mg/dL   Calcium 9.5 8.9 - 30.8 mg/dL   Total Protein 7.9 6.5 - 8.1 g/dL   Albumin 4.7 3.5 - 5.0 g/dL   AST 32 15 - 41 U/L   ALT 20 0 - 44 U/L   Alkaline Phosphatase 111 38 - 126 U/L   Total Bilirubin 0.7 0.3 - 1.2 mg/dL   GFR calc non Af Amer >60 >60 mL/min   GFR calc Af Amer >60 >60 mL/min   Anion gap 13 5 - 15    Comment: Performed at [facility], [address] [place], [place] [address]  Urinalysis, Complete w  Microscopic     Status: Abnormal   Collection Time: 03/31/19  8:54 PM  Result Value Ref Range   Color, Urine AMBER (A) YELLOW    Comment: BIOCHEMICALS MAY BE AFFECTED BY COLOR   APPearance HAZY (A) CLEAR   Specific Gravity, Urine 1.029 1.005 - 1.030   pH 5.0 5.0 - 8.0   Glucose, UA NEGATIVE NEGATIVE mg/dL   Hgb urine dipstick SMALL (A) NEGATIVE    Bilirubin Urine NEGATIVE NEGATIVE   Ketones, ur 20 (A) NEGATIVE mg/dL   Protein, ur 100 (A) NEGATIVE mg/dL   Nitrite NEGATIVE NEGATIVE   Leukocytes,Ua NEGATIVE NEGATIVE   RBC / HPF 0-5 0 - 5 RBC/hpf   WBC, UA 0-5 0 - 5 WBC/hpf   Bacteria, UA NONE SEEN NONE SEEN   Squamous Epithelial / LPF 0-5 0 - 5   Mucus PRESENT    Hyaline Casts, UA PRESENT     Comment: Performed at [facility], [address] [place], [place] [address]  Urine Drug Screen, Qualitative ([facility] only)     Status: Abnormal   Collection Time: 03/31/19  8:54 PM  Result Value Ref Range   Tricyclic, Ur Screen POSITIVE (A) NONE DETECTED   Amphetamines, Ur Screen POSITIVE (A) NONE DETECTED   MDMA (Ecstasy)Ur Screen NONE DETECTED NONE DETECTED   Cocaine Metabolite,Ur [place] POSITIVE (A) NONE DETECTED   Opiate, Ur Screen POSITIVE (A) NONE DETECTED   Phencyclidine (PCP) Ur S NONE DETECTED NONE DETECTED   Cannabinoid 50 Ng, Ur [place] NONE DETECTED NONE DETECTED   Barbiturates, Ur Screen POSITIVE (A) NONE DETECTED   Benzodiazepine, Ur Scrn POSITIVE (A) NONE DETECTED   Methadone Scn, Ur NONE DETECTED NONE DETECTED    Comment: (NOTE) Tricyclics + metabolites, urine    Cutoff 1000 ng/mL Amphetamines + metabolites, urine  Cutoff 1000 ng/mL MDMA (Ecstasy), urine              Cutoff 500 ng/mL Cocaine Metabolite, urine          Cutoff 300 ng/mL Opiate + metabolites, urine        Cutoff 300 ng/mL Phencyclidine (PCP), urine         Cutoff 25 ng/mL Cannabinoid, urine                 Cutoff 50 ng/mL Barbiturates + metabolites, urine  Cutoff 200 ng/mL Benzodiazepine, urine              Cutoff 200 ng/mL Methadone, urine                   Cutoff 300 ng/mL The urine drug screen provides only a preliminary, unconfirmed analytical test result and should not be used for non-medical purposes. Clinical consideration and professional judgment should be applied to any positive drug screen result due to possible interfering substances. A more  specific alternate chemical method must be used in order to obtain a confirmed analytical result. Gas chromatography / mass spectrometry (GC/MS) is the preferred confirmat ory method. Performed at [facility], [address] [place],  [address]   Ethanol     Status: None   Collection Time: 03/31/19  8:54 PM  Result Value Ref Range   Alcohol, Ethyl (B) <10 <10 mg/dL    Comment: (NOTE) Lowest detectable limit for serum alcohol is 10 mg/dL. For medical purposes only. Performed at [facility], [address] [place],  [address]   Blood culture (routine x 2)     Status: None (  Preliminary result)   Collection Time: 03/31/19 11:45 PM   Specimen: BLOOD  Result Value Ref Range   Specimen Description BLOOD RIGHT ASSIST CONTROL    Special Requests      BOTTLES DRAWN AEROBIC AND ANAEROBIC Blood Culture adequate volume   Culture      NO GROWTH < 12 HOURS Performed at [facility], [address] [place], [place] [address]    Report Status PENDING   SARS Coronavirus 2 (CEPHEID- Performed in [facility] hospital lab), Hosp Order     Status: None   Collection Time: 03/31/19 11:45 PM   Specimen: Nasopharyngeal Swab  Result Value Ref Range   SARS Coronavirus 2 NEGATIVE NEGATIVE    Comment: (NOTE) If result is NEGATIVE SARS-CoV-2 target nucleic acids are NOT DETECTED. The SARS-CoV-2 RNA is generally detectable in upper and lower  respiratory specimens during the acute phase of infection. The lowest  concentration of SARS-CoV-2 viral copies this assay can detect is 250  copies / mL. A negative result does not preclude SARS-CoV-2 infection  and should not be used as the sole basis for treatment or other  patient management decisions.  A negative result may occur with  improper specimen collection / handling, submission of specimen other  than nasopharyngeal swab, presence of viral mutation(s) within the  areas targeted by this assay, and  inadequate number of viral copies  (<250 copies / mL). A negative result must be combined with clinical  observations, patient history, and epidemiological information. If result is POSITIVE SARS-CoV-2 target nucleic acids are DETECTED. The SARS-CoV-2 RNA is generally detectable in upper and lower  respiratory specimens dur ing the acute phase of infection.  Positive  results are indicative of active infection with SARS-CoV-2.  Clinical  correlation with patient history and other diagnostic information is  necessary to determine patient infection status.  Positive results do  not rule out bacterial infection or co-infection with other viruses. If result is PRESUMPTIVE POSTIVE SARS-CoV-2 nucleic acids MAY BE PRESENT.   A presumptive positive result was obtained on the submitted specimen  and confirmed on repeat testing.  While 2019 novel coronavirus  (SARS-CoV-2) nucleic acids may be present in the submitted sample  additional confirmatory testing may be necessary for epidemiological  and / or clinical management purposes  to differentiate between  SARS-CoV-2 and other Sarbecovirus currently known to infect humans.  If clinically indicated additional testing with an alternate test  methodology [phone]([id number]) is advised. The SARS-CoV-2 RNA is generally  detectable in upper and lower respiratory sp ecimens during the acute  phase of infection. The expected result is Negative. Fact Sheet for Patients:  BoilerBrush.com.cyhttps://www.fda.gov/media/136312/download Fact Sheet for Healthcare Providers: https://pope.com/https://www.fda.gov/media/136313/download This test is not yet approved or cleared by the [place] FDA and has been authorized for detection and/or diagnosis of SARS-CoV-2 by FDA under an Emergency Use Authorization (EUA).  This EUA will remain in effect (meaning this test can be used) for the duration of the COVID-19 declaration under Section 564(b)(1) of the Act, 21 U.S.C. section 360bbb-3(b)(1), unless the  authorization is terminated or revoked sooner. Performed at [facility], [address] [place], [place] [address]   CBC with Differential/Platelet     Status: Abnormal   Collection Time: 03/31/19 11:59 PM  Result Value Ref Range   WBC 18.2 (H) 4.0 - 10.5 K/uL   RBC 3.87 3.87 - 5.11 MIL/uL   Hemoglobin 12.4 12.0 - 15.0 g/dL   HCT 30.836.3 65.736.0 - 84.646.0 %  MCV 93.8 80.0 - 100.0 fL   MCH 32.0 26.0 - 34.0 pg   MCHC 34.2 30.0 - 36.0 g/dL   RDW 16.1 09.6 - 04.5 %   Platelets 205 150 - 400 K/uL   nRBC 0.0 0.0 - 0.2 %   Neutrophils Relative % 87 %   Neutro Abs 15.9 (H) 1.7 - 7.7 K/uL   Lymphocytes Relative 6 %   Lymphs Abs 1.2 0.7 - 4.0 K/uL   Monocytes Relative 6 %   Monocytes Absolute 1.0 0.1 - 1.0 K/uL   Eosinophils Relative 0 %   Eosinophils Absolute 0.0 0.0 - 0.5 K/uL   Basophils Relative 0 %   Basophils Absolute 0.1 0.0 - 0.1 K/uL   Immature Granulocytes 1 %   Abs Immature Granulocytes 0.11 (H) 0.00 - 0.07 K/uL    Comment: Performed at [facility], [address] [place], [place] [address]  Blood culture (routine x 2)     Status: None (Preliminary result)   Collection Time: 03/31/19 11:59 PM   Specimen: BLOOD  Result Value Ref Range   Specimen Description BLOOD RIGHT ARM    Special Requests      BOTTLES DRAWN AEROBIC AND ANAEROBIC Blood Culture results may not be optimal due to an inadequate volume of blood received in culture bottles   Culture      NO GROWTH < 12 HOURS Performed at [facility], [address], [place], [place] [address]    Report Status PENDING     Blood Alcohol level:  Lab Results  Component Value Date   [facility] <10 03/31/2019   ETH <11 03/24/2013    Metabolic Disorder Labs:  No results found for: HGBA1C, MPG No results found for: PROLACTIN Lab Results  Component Value Date   CHOL 207 (H) 11/23/2008   TRIG 242 (H) 11/23/2008    Current Medications: Current Facility-Administered Medications  Medication Dose  Route Frequency Provider Last Rate Last Dose  . acetaminophen (TYLENOL) tablet 650 mg  650 mg Oral Q6H PRN Catalina Gravel, NP   650 mg at 04/02/19 1209  . albuterol (VENTOLIN HFA) 108 (90 Base) MCG/ACT inhaler 2 puff  2 puff Inhalation Q4H PRN Catalina Gravel, NP      . alum & mag hydroxide-simeth (MAALOX/MYLANTA) 200-200-20 MG/5ML suspension 30 mL  30 mL Oral Q4H PRN Thomspon, Adela Lank, NP      . cyclobenzaprine (FLEXERIL) tablet 10 mg  10 mg Oral TID PRN Jakeria Caissie, Jackquline Denmark, MD   10 mg at 04/02/19 8295  . diazepam (VALIUM) tablet 5 mg  5 mg Oral BID Gavrielle Streck, Jackquline Denmark, MD      . Melene Muller ON 04/03/2019] escitalopram (LEXAPRO) tablet 20 mg  20 mg Oral Daily Shaunn Tackitt T, MD      . gabapentin (NEURONTIN) capsule 800 mg  800 mg Oral TID Niranjan Rufener T, MD      . hydrOXYzine (ATARAX/VISTARIL) tablet 25 mg  25 mg Oral TID PRN Catalina Gravel, NP   25 mg at 04/02/19 1209  . magnesium hydroxide (MILK OF MAGNESIA) suspension 30 mL  30 mL Oral Daily PRN Catalina Gravel, NP      . pantoprazole (PROTONIX) EC tablet 40 mg  40 mg Oral Daily Catalina Gravel, NP   40 mg at 04/02/19 0751  . traZODone (DESYREL) tablet 50 mg  50 mg Oral QHS PRN Catalina Gravel, NP   50 mg at 04/01/19 2045   PTA Medications: Medications Prior to Admission  Medication Sig Dispense  Refill Last Dose  . albuterol (PROVENTIL HFA;VENTOLIN HFA) 108 (90 Base) MCG/ACT inhaler Inhale into the lungs.     . butalbital-acetaminophen-caffeine (FIORICET, ESGIC) 50-325-40 MG tablet Take 1 tablet by mouth every 6 (six) hours as needed.      . Cyanocobalamin (VITAMIN B-12) 5000 MCG TBDP Take by mouth.     . cyclobenzaprine (FLEXERIL) 10 MG tablet   4   . diazepam (VALIUM) 10 MG tablet   0   . escitalopram (LEXAPRO) 10 MG tablet   1   . gabapentin (NEURONTIN) 800 MG tablet Take 800 mg by mouth 3 (three) times daily.     Marland Kitchen OLANZapine (ZYPREXA) 5 MG tablet Take 1 tablet (5 mg total) by mouth at bedtime. 30 tablet 2   .  ondansetron (ZOFRAN) 8 MG tablet   4   . pantoprazole (PROTONIX) 40 MG tablet   1   . promethazine (PHENERGAN) 25 MG tablet   4   . QUEtiapine (SEROQUEL) 25 MG tablet   4   . sertraline (ZOLOFT) 100 MG tablet Take 150 mg by mouth.     . traZODone (DESYREL) 100 MG tablet Take 100 mg by mouth at bedtime.       Musculoskeletal: Strength & Muscle Tone: within normal limits Gait & Station: normal Patient leans: N/A  Psychiatric Specialty Exam: Physical Exam  Nursing note and vitals reviewed. Constitutional: She appears well-developed and well-nourished.  HENT:  Head: Normocephalic and atraumatic.  Eyes: Pupils are equal, round, and reactive to light. Conjunctivae are normal.  Neck: Normal range of motion.  Cardiovascular: Regular rhythm and normal heart sounds.  Respiratory: Effort normal. No respiratory distress.  GI: Soft.  Musculoskeletal: Normal range of motion.  Neurological: She is alert.  Skin: Skin is warm and dry.  Psychiatric: Her speech is normal and behavior is normal. Thought content normal. Her mood appears anxious. Cognition and memory are normal. She expresses impulsivity.    Review of Systems  Constitutional: Negative.   HENT: Negative.   Eyes: Negative.   Respiratory: Negative.   Cardiovascular: Negative.   Gastrointestinal: Negative.   Musculoskeletal: Negative.   Skin: Negative.   Neurological: Negative.   Psychiatric/Behavioral: Positive for substance abuse. Negative for depression, hallucinations, memory loss and suicidal ideas. The patient is nervous/anxious and has insomnia.     Blood pressure 110/81, pulse 76, temperature 97.7 F (36.5 C), temperature source Oral, resp. rate 18, height  (1.753 m), weight 48.1 kg, SpO2 100 %.Body mass index is 15.65 kg/m.  General Appearance: Casual  Eye Contact:  Fair  Speech:  Clear and Coherent  Volume:  Normal  Mood:  Euthymic  Affect:  Congruent  Thought Process:  Goal Directed  Orientation:  Full (Time,  Place, and Person)  Thought Content:  Logical  Suicidal Thoughts:  No  Homicidal Thoughts:  No  Memory:  Immediate;   Fair Recent;   Fair Remote;   Fair  Judgement:  Fair  Insight:  Fair  Psychomotor Activity:  Normal  Concentration:  Concentration: Fair  Recall:  [employer] of Knowledge:  Fair  Language:  Fair  Akathisia:  No  Handed:  Right  AIMS (if indicated):     Assets:  Desire for Improvement Housing Physical Health Resilience Social Support  ADL's:  Intact  Cognition:  WNL  Sleep:  Number of Hours: 8    Treatment Plan Summary: Daily contact with patient to assess and evaluate symptoms and progress in treatment, Medication management and Plan  Patient currently is calm and does not appear to be having either a manic episode or episode of major depression but is going through a crisis related to escalating substance abuse and social problems.  No sign of psychosis on interview.  Patient is taking care of her basic health and denies any suicidal or homicidal thoughts.  No clear indication really for inpatient psychiatric hospitalization.  Spoke with the patient about her plans for substance abuse treatment.  Patient claims to have been to "detox" programs in [place] in the past and would like to try to go follow-up with those.  I spoke with her about the dangers of using Valium and barbiturates together as well as using either or both of them with narcotics emphasizing the potential for fatal overdose.  Strongly encourage the patient to talk with her primary psychiatrist about her benzodiazepine use.  Patient is aware of all this.  Understands the current risks.  Prefers discharge at this time and does not meet commitment criteria.  Observation Level/Precautions:  15 minute checks  Laboratory:  Chemistry Profile  Psychotherapy:    Medications:    Consultations:    Discharge Concerns:    Estimated LOS:  Other:     Physician Treatment Plan for Primary Diagnosis: Substance  induced mood disorder (HCC) Long Term Goal(s): Improvement in symptoms so as ready for discharge  Short Term Goals: Ability to disclose and discuss suicidal ideas and Ability to demonstrate self-control will improve  Physician Treatment Plan for Secondary Diagnosis: Principal Problem:   Substance induced mood disorder (HCC) Active Problems:   Amphetamine abuse (HCC)   Opiate abuse, continuous (HCC)  Long Term Goal(s): Improvement in symptoms so as ready for discharge  Short Term Goals: Ability to identify triggers associated with substance abuse/mental health issues will improve  I certify that inpatient services furnished can reasonably be expected to improve the patient's condition.    Mordecai RasmussenJohn Jullia Mulligan, MD 6/25/20201:41 PM
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
## 2019-04-02 NOTE — [facility] Suicide Risk Assessment (Signed)
[facility] Discharge Suicide Risk Assessment   Principal Problem: Substance induced mood disorder ([place]) Discharge Diagnoses: Principal Problem:   Substance induced mood disorder ([place]) Active Problems:   Amphetamine abuse ([place])   Opiate abuse, continuous ([place])   Total Time spent with patient: 1 hour  Musculoskeletal: Strength & Muscle Tone: within normal limits Gait & Station: normal Patient leans: N/A  Psychiatric Specialty Exam: Review of Systems  Constitutional: Negative.   HENT: Negative.   Eyes: Negative.   Respiratory: Negative.   Cardiovascular: Negative.   Gastrointestinal: Negative.   Musculoskeletal: Negative.   Skin: Negative.   Neurological: Negative.   Psychiatric/Behavioral: Positive for substance abuse. Negative for depression, hallucinations, memory loss and suicidal ideas. The patient is nervous/anxious and has insomnia.     Blood pressure 110/81, pulse 76, temperature 97.7 F (36.5 C), temperature source Oral, resp. rate 18, height 5\' 9"  (1.753 m), weight 48.1 kg, SpO2 100 %.Body mass index is 15.65 kg/m.  General Appearance: Casual  Eye Contact::  Fair  Speech:  Normal Rate409  Volume:  Normal  Mood:  Anxious  Affect:  Congruent  Thought Process:  Goal Directed  Orientation:  Full (Time, Place, and Person)  Thought Content:  Logical  Suicidal Thoughts:  No  Homicidal Thoughts:  No  Memory:  Immediate;   Fair Recent;   Fair Remote;   Fair  Judgement:  Fair  Insight:  Fair  Psychomotor Activity:  Normal  Concentration:  Fair  Recall:  [employer] [place]  Language: Fair  Akathisia:  No  Handed:  Right  AIMS (if indicated):     Assets:  Desire for Improvement Housing Physical Health Social Support  Sleep:  Number of Hours: 8  Cognition: WNL  ADL's:  Intact   Mental Status Per Nursing Assessment::   On Admission:  NA  Demographic Factors:  Caucasian  Loss Factors: Loss of significant relationship  Historical Factors: Prior  suicide attempts  Risk Reduction Factors:   Sense of responsibility to family, Living with another person, especially a relative, Positive social support and Positive therapeutic relationship  Continued Clinical Symptoms:  Alcohol/Substance Abuse/Dependencies  Cognitive Features That Contribute To Risk:  Thought constriction (tunnel vision)    Suicide Risk:  Minimal: No identifiable suicidal ideation.  Patients presenting with no risk factors but with morbid ruminations; may be classified as minimal risk based on the severity of the depressive symptoms  Follow-up Information    [facility] Follow up.   Contact information: [place] [address] [phone]           Plan Of Care/Follow-up recommendations:  Activity:  Activity as tolerated Diet:  Regular diet Other:  Patient will be given information about local options such as [place].  She also is familiar with other substance abuse detox and treatment options in the area.  Additionally she has her own outpatient psychiatrist she still sees in the [facility] area.  Alethia Berthold, MD 04/02/2019, 1:52 PM

## 2019-04-02 NOTE — Progress Notes (Signed)
Recreation Therapy Notes   Date: 04/02/2019  Time: 9:30 am   Location: Craft room   Behavioral response: N/A   Intervention Topic: Coping skills  Discussion/Intervention: Patient did not attend group.   Clinical Observations/Feedback:  Patient did not attend group.   Alexandro Line LRT/CTRS        Jacyln Carmer 04/02/2019 10:41 AM

## 2019-04-02 NOTE — Progress Notes (Signed)
  [facility] Adult Case Management Discharge Plan :  Will you be returning to the same living situation after discharge:  Yes,  her own apartment At discharge, do you have transportation home?: Yes,  mother will pick pt up Do you have the ability to pay for your medications: Yes,  mental health  Release of information consent forms completed and in the chart;    Patient to Follow up at: Follow-up Information    [facility] Follow up on 04/14/2019.   Why: Please follow up with [facility] on Tuesday 04/14/19 at [place]. Please bring hospital discharge paperwork, photo id, proof of income, and medication list with you to your appointment. Thank You! Contact information: [place] [address] [phone]           Next level of care provider has access to [place] and Suicide Prevention discussed: Yes,  SPE completed with pt as pt declined collateral contact     Has patient been referred to the Quitline?: Patient refused referral  Patient has been referred for addiction treatment: Pt. refused referral  Delfin Edis, LCSW 04/02/2019, 1:59 PM

## 2019-04-06 LAB — CULTURE, BLOOD (ROUTINE X 2)
Culture: NO GROWTH
Culture: NO GROWTH
Special Requests: ADEQUATE

## 2020-01-02 ENCOUNTER — Encounter: Payer: Self-pay | Admitting: Emergency Medicine

## 2020-01-02 ENCOUNTER — Emergency Department
Admission: EM | Admit: 2020-01-02 | Discharge: 2020-01-03 | Disposition: A | Discharge status: Other Acute Inpatient Hospital | Payer: Self-pay | Attending: Emergency Medicine | Admitting: Emergency Medicine

## 2020-01-02 ENCOUNTER — Other Ambulatory Visit: Payer: Self-pay

## 2020-01-02 DIAGNOSIS — W272XXA Contact with scissors, initial encounter: Secondary | ICD-10-CM | POA: Insufficient documentation

## 2020-01-02 DIAGNOSIS — Z23 Encounter for immunization: Secondary | ICD-10-CM | POA: Insufficient documentation

## 2020-01-02 DIAGNOSIS — F333 Major depressive disorder, recurrent, severe with psychotic symptoms: Secondary | ICD-10-CM | POA: Insufficient documentation

## 2020-01-02 DIAGNOSIS — Y999 Unspecified external cause status: Secondary | ICD-10-CM | POA: Insufficient documentation

## 2020-01-02 DIAGNOSIS — T1491XA Suicide attempt, initial encounter: Secondary | ICD-10-CM

## 2020-01-02 DIAGNOSIS — S41112A Laceration without foreign body of left upper arm, initial encounter: Secondary | ICD-10-CM | POA: Insufficient documentation

## 2020-01-02 DIAGNOSIS — S51812A Laceration without foreign body of left forearm, initial encounter: Secondary | ICD-10-CM | POA: Insufficient documentation

## 2020-01-02 DIAGNOSIS — Y9389 Activity, other specified: Secondary | ICD-10-CM | POA: Insufficient documentation

## 2020-01-02 DIAGNOSIS — Y92019 Unspecified place in single-family (private) house as the place of occurrence of the external cause: Secondary | ICD-10-CM | POA: Insufficient documentation

## 2020-01-02 DIAGNOSIS — S41111A Laceration without foreign body of right upper arm, initial encounter: Secondary | ICD-10-CM | POA: Insufficient documentation

## 2020-01-02 DIAGNOSIS — Z20822 Contact with and (suspected) exposure to covid-19: Secondary | ICD-10-CM | POA: Insufficient documentation

## 2020-01-02 HISTORY — DX: Suicide attempt, initial encounter: T14.91XA

## 2020-01-02 LAB — CBC WITH DIFFERENTIAL/PLATELET
Abs Immature Granulocytes: 0.06 10*3/uL (ref 0.00–0.07)
Basophils Absolute: 0 10*3/uL (ref 0.0–0.1)
Basophils Relative: 0 %
Eosinophils Absolute: 0 10*3/uL (ref 0.0–0.5)
Eosinophils Relative: 0 %
HCT: 26.1 % — ABNORMAL LOW (ref 36.0–46.0)
Hemoglobin: 9 g/dL — ABNORMAL LOW (ref 12.0–15.0)
Immature Granulocytes: 1 %
Lymphocytes Relative: 13 %
Lymphs Abs: 1.7 10*3/uL (ref 0.7–4.0)
MCH: 30.8 pg (ref 26.0–34.0)
MCHC: 34.5 g/dL (ref 30.0–36.0)
MCV: 89.4 fL (ref 80.0–100.0)
Monocytes Absolute: 0.6 10*3/uL (ref 0.1–1.0)
Monocytes Relative: 4 %
Neutro Abs: 10.7 10*3/uL — ABNORMAL HIGH (ref 1.7–7.7)
Neutrophils Relative %: 82 %
Platelets: 215 10*3/uL (ref 150–400)
RBC: 2.92 MIL/uL — ABNORMAL LOW (ref 3.87–5.11)
RDW: 12.8 % (ref 11.5–15.5)
WBC: 13.1 10*3/uL — ABNORMAL HIGH (ref 4.0–10.5)
nRBC: 0 % (ref 0.0–0.2)

## 2020-01-02 LAB — COMPREHENSIVE METABOLIC PANEL
ALT: 32 U/L (ref 0–44)
AST: 76 U/L — ABNORMAL HIGH (ref 15–41)
Albumin: 4.3 g/dL (ref 3.5–5.0)
Alkaline Phosphatase: 68 U/L (ref 38–126)
Anion gap: 11 (ref 5–15)
BUN: 27 mg/dL — ABNORMAL HIGH (ref 6–20)
CO2: 18 mmol/L — ABNORMAL LOW (ref 22–32)
Calcium: 8.5 mg/dL — ABNORMAL LOW (ref 8.9–10.3)
Chloride: 105 mmol/L (ref 98–111)
Creatinine, Ser: 1.42 mg/dL — ABNORMAL HIGH (ref 0.44–1.00)
GFR calc Af Amer: 50 mL/min — ABNORMAL LOW (ref >60)
GFR calc non Af Amer: 44 mL/min — ABNORMAL LOW (ref >60)
Glucose, Bld: 118 mg/dL — ABNORMAL HIGH (ref 70–99)
Potassium: 3.1 mmol/L — ABNORMAL LOW (ref 3.5–5.1)
Sodium: 134 mmol/L — ABNORMAL LOW (ref 135–145)
Total Bilirubin: 0.4 mg/dL (ref 0.3–1.2)
Total Protein: 7.2 g/dL (ref 6.5–8.1)

## 2020-01-02 LAB — RESPIRATORY PANEL BY RT PCR (FLU A&B, COVID)
Influenza A by PCR: NEGATIVE
Influenza B by PCR: NEGATIVE
SARS Coronavirus 2 by RT PCR: NEGATIVE

## 2020-01-02 LAB — SALICYLATE LEVEL: Salicylate Lvl: <7 mg/dL — ABNORMAL LOW (ref 7.0–30.0)

## 2020-01-02 LAB — ACETAMINOPHEN LEVEL: Acetaminophen (Tylenol), Serum: 11 ug/mL (ref 10–30)

## 2020-01-02 LAB — TSH: TSH: 0.725 u[IU]/mL (ref 0.350–4.500)

## 2020-01-02 LAB — ETHANOL: Alcohol, Ethyl (B): <10 mg/dL (ref <10)

## 2020-01-02 MED ORDER — SODIUM CHLORIDE 0.9 % IV SOLN: Freq: Once | INTRAVENOUS | Status: AC

## 2020-01-02 MED ORDER — TETANUS-DIPHTH-ACELL PERTUSSIS 5-2.5-18.5 LF-MCG/0.5 IM SUSP
0.5000 mL | Freq: Once | INTRAMUSCULAR | Status: AC
  Administered 2020-01-02: 21:00:00 0.5 mL via INTRAMUSCULAR
  Filled 2020-01-02: qty 0.5

## 2020-01-02 MED ORDER — CEPHALEXIN 500 MG PO CAPS
500.0000 mg | ORAL_CAPSULE | Freq: Four times a day (QID) | ORAL | Status: DC
  Administered 2020-01-02: 500 mg via ORAL
  Filled 2020-01-02: qty 1

## 2020-01-02 MED ORDER — CEFAZOLIN SODIUM-DEXTROSE 1-4 GM/50ML-% IV SOLN
1.0000 g | Freq: Once | INTRAVENOUS | Status: AC
  Administered 2020-01-02: 22:00:00 1 g via INTRAVENOUS
  Filled 2020-01-02: qty 50

## 2020-01-02 MED ORDER — NICOTINE 21 MG/24HR TD PT24
21.0000 mg | MEDICATED_PATCH | Freq: Once | TRANSDERMAL | Status: DC
  Administered 2020-01-02: 21 mg via TRANSDERMAL
  Filled 2020-01-02: qty 1
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
## 2020-01-02 NOTE — ED Provider Notes (Signed)
-----------------------------------------   11:21 PM on 01/02/2020 -----------------------------------------  Assuming care from Dr. Mayford Knife.  In short, Erin [name] is a 49 y.o. female with a chief complaint of suicidal ideation and self-harm.  Refer to the original H&P for additional details.  As per Dr. Mayford Knife, patient is medically clear and ready for psychiatric admission.  The patient has been placed in psychiatric observation due to the need to provide a safe environment for the patient while obtaining psychiatric consultation and evaluation, as well as ongoing medical and medication management to treat the patient's condition.  The patient has been placed under full IVC at this time.    Loleta Rose, MD 01/02/20 [id number]
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
## 2020-01-02 NOTE — ED Notes (Signed)
TTS at bedside at this time.  

## 2020-01-02 NOTE — ED Notes (Signed)
EDT Jann at bedside for safety 1:1 monitoring

## 2020-01-02 NOTE — BH Assessment (Signed)
PATIENT BED AVAILABLE PENDING MEDICAL CLEARANCE  Patient is to be admitted to [facility] by Psychiatric Nurse Practitioner Rishaun Dixon.  Attending Physician will be Dr. Toni Amend.   Patient has been assigned to room 304, by [facility] Charge Nurse Britta Mccreedy.    ER staff is aware of the admission:  [facility] ER Secretary    Dr. Mayford Knife, ER MD   Maralyn Sago Patient's Nurse   [facility] Patient Access.

## 2020-01-02 NOTE — ED Notes (Signed)
Pt given a meal tray and a beverage and asked for please notify staff when able to urinate
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
## 2020-01-02 NOTE — ED Notes (Signed)
Pt's belongings consist of:   1 pair of pink colored fuzzy open toed shoes, 1 pair of blue jeans, 1 pair of grey underwear, 1 multi colored stone for a belly ring, 1 black and white shirt, 1 hair tie/  PT HAS A GREY COLORED RING STILL ON HER PERSON: RING IS ON THE RIGHT RING FINGER THAT CANNOT BE REMOVED BY STAFF'S ATTEMPTS

## 2020-01-03 ENCOUNTER — Inpatient Hospital Stay
Admit: 2020-01-03 | Discharge: 2020-01-05 | DRG: 897 | Disposition: A | Discharge status: Home / Self Care | Payer: Self-pay | Source: Intra-hospital | Attending: Psychiatry | Admitting: Psychiatry

## 2020-01-03 ENCOUNTER — Encounter: Payer: Self-pay | Admitting: Psychiatric/Mental Health

## 2020-01-03 DIAGNOSIS — Z811 Family history of alcohol abuse and dependence: Secondary | ICD-10-CM

## 2020-01-03 DIAGNOSIS — Z814 Family history of other substance abuse and dependence: Secondary | ICD-10-CM

## 2020-01-03 DIAGNOSIS — Z825 Family history of asthma and other chronic lower respiratory diseases: Secondary | ICD-10-CM

## 2020-01-03 DIAGNOSIS — Z833 Family history of diabetes mellitus: Secondary | ICD-10-CM

## 2020-01-03 DIAGNOSIS — F1994 Other psychoactive substance use, unspecified with psychoactive substance-induced mood disorder: Secondary | ICD-10-CM | POA: Diagnosis present

## 2020-01-03 DIAGNOSIS — R569 Unspecified convulsions: Secondary | ICD-10-CM | POA: Diagnosis present

## 2020-01-03 DIAGNOSIS — F1910 Other psychoactive substance abuse, uncomplicated: Secondary | ICD-10-CM | POA: Diagnosis present

## 2020-01-03 DIAGNOSIS — R4587 Impulsiveness: Secondary | ICD-10-CM | POA: Diagnosis present

## 2020-01-03 DIAGNOSIS — Y900 Blood alcohol level of less than 20 mg/100 ml: Secondary | ICD-10-CM | POA: Diagnosis present

## 2020-01-03 DIAGNOSIS — F419 Anxiety disorder, unspecified: Secondary | ICD-10-CM | POA: Diagnosis present

## 2020-01-03 DIAGNOSIS — G4700 Insomnia, unspecified: Secondary | ICD-10-CM | POA: Diagnosis present

## 2020-01-03 DIAGNOSIS — J449 Chronic obstructive pulmonary disease, unspecified: Secondary | ICD-10-CM | POA: Diagnosis present

## 2020-01-03 DIAGNOSIS — F17210 Nicotine dependence, cigarettes, uncomplicated: Secondary | ICD-10-CM | POA: Diagnosis present

## 2020-01-03 DIAGNOSIS — Z9119 Patient's noncompliance with other medical treatment and regimen: Secondary | ICD-10-CM

## 2020-01-03 DIAGNOSIS — G8929 Other chronic pain: Secondary | ICD-10-CM | POA: Diagnosis present

## 2020-01-03 DIAGNOSIS — X789XXA Intentional self-harm by unspecified sharp object, initial encounter: Secondary | ICD-10-CM | POA: Diagnosis present

## 2020-01-03 DIAGNOSIS — Z800 Family history of malignant neoplasm of digestive organs: Secondary | ICD-10-CM

## 2020-01-03 DIAGNOSIS — F319 Bipolar disorder, unspecified: Secondary | ICD-10-CM | POA: Diagnosis present

## 2020-01-03 DIAGNOSIS — Z818 Family history of other mental and behavioral disorders: Secondary | ICD-10-CM

## 2020-01-03 DIAGNOSIS — F1494 Cocaine use, unspecified with cocaine-induced mood disorder: Principal | ICD-10-CM | POA: Diagnosis present

## 2020-01-03 DIAGNOSIS — F329 Major depressive disorder, single episode, unspecified: Secondary | ICD-10-CM | POA: Diagnosis present

## 2020-01-03 DIAGNOSIS — G43909 Migraine, unspecified, not intractable, without status migrainosus: Secondary | ICD-10-CM | POA: Diagnosis present

## 2020-01-03 DIAGNOSIS — K219 Gastro-esophageal reflux disease without esophagitis: Secondary | ICD-10-CM | POA: Diagnosis present

## 2020-01-03 DIAGNOSIS — Z915 Personal history of self-harm: Secondary | ICD-10-CM

## 2020-01-03 DIAGNOSIS — M1990 Unspecified osteoarthritis, unspecified site: Secondary | ICD-10-CM | POA: Diagnosis present

## 2020-01-03 DIAGNOSIS — F1010 Alcohol abuse, uncomplicated: Secondary | ICD-10-CM | POA: Diagnosis present

## 2020-01-03 DIAGNOSIS — Z8261 Family history of arthritis: Secondary | ICD-10-CM

## 2020-01-03 DIAGNOSIS — R071 Chest pain on breathing: Secondary | ICD-10-CM

## 2020-01-03 DIAGNOSIS — E785 Hyperlipidemia, unspecified: Secondary | ICD-10-CM | POA: Diagnosis present

## 2020-01-03 DIAGNOSIS — M549 Dorsalgia, unspecified: Secondary | ICD-10-CM | POA: Diagnosis present

## 2020-01-03 DIAGNOSIS — Z823 Family history of stroke: Secondary | ICD-10-CM

## 2020-01-03 DIAGNOSIS — Z8249 Family history of ischemic heart disease and other diseases of the circulatory system: Secondary | ICD-10-CM

## 2020-01-03 HISTORY — DX: Other specified health status: Z78.9

## 2020-01-03 HISTORY — DX: Unspecified osteoarthritis, unspecified site: M19.90

## 2020-01-03 LAB — URINALYSIS, COMPLETE (UACMP) WITH MICROSCOPIC
Bilirubin Urine: NEGATIVE
Glucose, UA: NEGATIVE mg/dL
Ketones, ur: NEGATIVE mg/dL
Nitrite: POSITIVE — AB
Protein, ur: 30 mg/dL — AB
Specific Gravity, Urine: 1.013 (ref 1.005–1.030)
WBC, UA: >50 WBC/hpf — ABNORMAL HIGH (ref 0–5)
pH: 5 (ref 5.0–8.0)

## 2020-01-03 LAB — URINE DRUG SCREEN, QUALITATIVE (ARMC ONLY)
Amphetamines, Ur Screen: POSITIVE — AB
Barbiturates, Ur Screen: POSITIVE — AB
Benzodiazepine, Ur Scrn: POSITIVE — AB
Cannabinoid 50 Ng, Ur ~~LOC~~: NOT DETECTED
Cocaine Metabolite,Ur ~~LOC~~: POSITIVE — AB
MDMA (Ecstasy)Ur Screen: NOT DETECTED
Methadone Scn, Ur: NOT DETECTED
Opiate, Ur Screen: NOT DETECTED
Phencyclidine (PCP) Ur S: NOT DETECTED
Tricyclic, Ur Screen: NOT DETECTED

## 2020-01-03 MED ORDER — PRENATAL MULTIVITAMIN CH
1.0000 | ORAL_TABLET | Freq: Every day | ORAL | Status: DC
  Administered 2020-01-03 – 2020-01-05 (×3): 1 via ORAL
  Filled 2020-01-03 (×3): qty 1

## 2020-01-03 MED ORDER — KETOROLAC TROMETHAMINE 60 MG/2ML IM SOLN
30.0000 mg | Freq: Every day | INTRAMUSCULAR | Status: DC | PRN
  Administered 2020-01-03 – 2020-01-05 (×3): 30 mg via INTRAMUSCULAR
  Filled 2020-01-03 (×4): qty 2

## 2020-01-03 MED ORDER — ACETAMINOPHEN 325 MG PO TABS
650.0000 mg | ORAL_TABLET | Freq: Four times a day (QID) | ORAL | Status: DC | PRN
  Administered 2020-01-03 – 2020-01-05 (×6): 650 mg via ORAL
  Filled 2020-01-03 (×6): qty 2

## 2020-01-03 MED ORDER — TRAZODONE HCL 100 MG PO TABS
100.0000 mg | ORAL_TABLET | Freq: Every evening | ORAL | Status: DC | PRN
  Administered 2020-01-03 – 2020-01-04 (×3): 100 mg via ORAL
  Filled 2020-01-03 (×3): qty 1

## 2020-01-03 MED ORDER — CARBAMAZEPINE ER 100 MG PO TB12
100.0000 mg | ORAL_TABLET | Freq: Two times a day (BID) | ORAL | Status: DC
  Administered 2020-01-03 – 2020-01-05 (×5): 100 mg via ORAL
  Filled 2020-01-03 (×5): qty 1

## 2020-01-03 MED ORDER — ALUM & MAG HYDROXIDE-SIMETH 200-200-20 MG/5ML PO SUSP: 30.0000 mL | ORAL | Status: DC | PRN

## 2020-01-03 MED ORDER — MAGNESIUM HYDROXIDE 400 MG/5ML PO SUSP: 30.0000 mL | Freq: Every day | ORAL | Status: DC | PRN

## 2020-01-03 MED ORDER — NICOTINE 21 MG/24HR TD PT24
21.0000 mg | MEDICATED_PATCH | Freq: Every day | TRANSDERMAL | Status: DC
  Administered 2020-01-03 – 2020-01-05 (×3): 21 mg via TRANSDERMAL
  Filled 2020-01-03 (×3): qty 1

## 2020-01-03 MED ORDER — MIRTAZAPINE 15 MG PO TBDP
15.0000 mg | ORAL_TABLET | Freq: Every day | ORAL | Status: DC
  Administered 2020-01-03 – 2020-01-04 (×2): 15 mg via ORAL
  Filled 2020-01-03 (×2): qty 1

## 2020-01-03 MED ORDER — HYDROXYZINE HCL 25 MG PO TABS
25.0000 mg | ORAL_TABLET | Freq: Three times a day (TID) | ORAL | Status: DC | PRN
  Administered 2020-01-03 – 2020-01-04 (×4): 25 mg via ORAL
  Filled 2020-01-03 (×4): qty 1

## 2020-01-03 NOTE — BHH Group Notes (Signed)
LCSW Group Therapy Notes  Date and Time: 01/03/20 1:00PM  Type of Therapy and Topic: Group Therapy: Healthy Vs. Unhealthy Coping Strategies  Participation Level: [facility] PARTICIPATION LEVEL: Did Not Attend  Description of Group:  In this group, patients will be encouraged to explore their healthy and unhealthy coping strategics. Coping strategies are actions that we take to deal with stress, problems, or uncomfortable emotions in our daily lives. Each patient will be challenged to read some scenarios and discuss the unhealthy and healthy coping strategies within those scenarios. Also, each patient will be challenged to describe current healthy and unhealthy strategies that they use in their own lives and discuss the outcomes and barriers to those strategies. This group will be process-oriented, with patients participating in exploration of their own experiences as well as giving and receiving support and challenge from other group members.  Therapeutic Goals: 1. Patient will identify personal healthy and unhealthy coping strategies. 2. Patient will identify healthy and unhealthy coping strategies, in others, through scenarios.  3. Patient will identify expected outcomes of healthy and unhealthy coping strategies. 4. Patient will identify barriers to using healthy coping strategies.   Summary of Patient Progress:  X   Therapeutic Modalities:  Cognitive Behavioral Therapy Solution Focused Therapy Motivational Interviewing   Mont Lamarco Gudiel, MSW, LCSWA Clinical Social Worker    
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
## 2020-01-03 NOTE — ED Notes (Signed)
No e-sig obtained pt IVC

## 2020-01-03 NOTE — Plan of Care (Signed)
°  Problem: Education: °Goal: Knowledge of [facility] Education information/materials will improve °Outcome: Not Progressing °Goal: Emotional status will improve °Outcome: Not Progressing °Goal: Mental status will improve °Outcome: Not Progressing °Goal: Verbalization of understanding the information provided will improve °Outcome: Not Progressing °  °Problem: Activity: °Goal: Interest or engagement in activities will improve °Outcome: Not Progressing °Goal: Sleeping patterns will improve °Outcome: Not Progressing °  °Problem: Coping: °Goal: Ability to verbalize frustrations and anger appropriately will improve °Outcome: Not Progressing °Goal: Ability to demonstrate self-control will improve °Outcome: Not Progressing °  °Problem: Health Behavior/Discharge Planning: °Goal: Identification of resources available to assist in meeting health care needs will improve °Outcome: Not Progressing °Goal: Compliance with treatment plan for underlying cause of condition will improve °Outcome: Not Progressing °  °Problem: Safety: °Goal: Periods of time without injury will increase °Outcome: Not Progressing °  °
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
## 2020-01-03 NOTE — Progress Notes (Signed)
Patient has been observed on the unit, making phone calls, during the afternoon hours. Patient also came down to the community room for dinner and met this writer in the medication room for her evening meds.
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
## 2020-01-03 NOTE — Plan of Care (Signed)
°  Problem: Education: °Goal: Knowledge of [facility] Education information/materials will improve °Outcome: Not Progressing °Goal: Emotional status will improve °Outcome: Not Progressing °Goal: Mental status will improve °Outcome: Not Progressing °Goal: Verbalization of understanding the information provided will improve °Outcome: Not Progressing °  °Problem: Activity: °Goal: Interest or engagement in activities will improve °Outcome: Not Progressing °Goal: Sleeping patterns will improve °Outcome: Not Progressing °  °Problem: Coping: °Goal: Ability to verbalize frustrations and anger appropriately will improve °Outcome: Not Progressing °Goal: Ability to demonstrate self-control will improve °Outcome: Not Progressing °  °Problem: Health Behavior/Discharge Planning: °Goal: Identification of resources available to assist in meeting health care needs will improve °Outcome: Not Progressing °Goal: Compliance with treatment plan for underlying cause of condition will improve °Outcome: Not Progressing °  °Problem: Safety: °Goal: Periods of time without injury will increase °Outcome: Not Progressing °  °
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
## 2020-01-04 DIAGNOSIS — F1994 Other psychoactive substance use, unspecified with psychoactive substance-induced mood disorder: Secondary | ICD-10-CM

## 2020-01-04 MED ORDER — HYDROXYZINE HCL 50 MG PO TABS
50.0000 mg | ORAL_TABLET | Freq: Three times a day (TID) | ORAL | Status: DC | PRN
  Administered 2020-01-04 – 2020-01-05 (×3): 50 mg via ORAL
  Filled 2020-01-04 (×3): qty 1

## 2020-01-04 MED ORDER — NITROFURANTOIN MONOHYD MACRO 100 MG PO CAPS
100.0000 mg | ORAL_CAPSULE | Freq: Two times a day (BID) | ORAL | Status: DC
  Administered 2020-01-04 – 2020-01-05 (×3): 100 mg via ORAL
  Filled 2020-01-04 (×5): qty 1

## 2020-01-04 NOTE — [facility] Suicide Risk Assessment (Signed)
[facility] INPATIENT:  Family/Significant Other Suicide Prevention Education  Suicide Prevention Education:  Education Completed; [name], mother 37 [phone] has been identified by the patient as the family member/significant other with whom the patient will be residing, and identified as the person(s) who will aid the patient in the event of a mental health crisis (suicidal ideations/suicide attempt).  With written consent from the patient, the family member/significant other has been provided the following suicide prevention education, prior to the and/or following the discharge of the patient.  The suicide prevention education provided includes the following:  Suicide risk factors  Suicide prevention and interventions  National Suicide Hotline telephone number  [facility] assessment telephone number  [facility] Emergency Assistance 911  [facility] and/or Residential Mobile Crisis Unit telephone number  Request made of family/significant other to:  Remove weapons (e.g., guns, rifles, knives), all items previously/currently identified as safety concern.    Remove drugs/medications (over-the-counter, prescriptions, illicit drugs), all items previously/currently identified as a safety concern.  The family member/significant other verbalizes understanding of the suicide prevention education information provided.  The family member/significant other agrees to remove the items of safety concern listed above.  CSW spoke with pts mother who reported that pt is in the hospital due to being a terrible drug addict and cant seem to stay away from the drugs. Per mother pt is extremely depressed and overdosed on her medications. She reported that over the weekend, pt was not herself and seemed to be miserable and angry and wanted to die. Mother expressed SI concerns stating that she is unsure what pt will do because pt is so unhappy and unstable and has past attempts. Mother  reported no HI concerns and denied pt having access to guns.  Charlann Lange Finley Chevez MSW LCSW 01/04/2020, 1:58 PM
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
## 2020-01-04 NOTE — Plan of Care (Signed)
  Problem: Education: Goal: Emotional status will improve Outcome: Progressing Goal: Mental status will improve Outcome: Progressing   Problem: Activity: Goal: Interest or engagement in activities will improve Outcome: Progressing Goal: Sleeping patterns will improve Outcome: Progressing   Problem: Coping: Goal: Ability to verbalize frustrations and anger appropriately will improve Outcome: Progressing Goal: Ability to demonstrate self-control will improve Outcome: Progressing   Problem: Health Behavior/Discharge Planning: Goal: Identification of resources available to assist in meeting health care needs will improve Outcome: Progressing Goal: Compliance with treatment plan for underlying cause of condition will improve Outcome: Progressing

## 2020-01-04 NOTE — Progress Notes (Signed)
Patient's dressings were changed, wounds cleaned with NS, and dressed with non-adherent pads and Kerlix. Patient tolerated dressing change well.

## 2020-01-04 NOTE — Progress Notes (Signed)
Patient pleasant and alert x 4.  Reports symptoms have much improved since arriving at the hospital. Continues to endorse anxiety and depression but states that her meds are helping. Denies SI/HI/AVH during this encounter. Received scheduled medication and tolerated without incident. Patient informed to notify staff with questions and concerns.  Patient is safe with 15 minute safety checks.

## 2020-01-04 NOTE — Progress Notes (Signed)
[facility] MD Progress Note  01/04/2020 11:33 AM Erin [name]  MRN:  [id number]   Subjective: Follow-up for this 49 year old female diagnosed with substance-induced mood disorder who presented to [facility] ED reporting SI with numerous lacerations bilateral forearms.  Patient reports that she is doing well today.  She states that her arms are sore and she is waiting to get her Toradol injection.  Patient reports that that she had a and her boyfriend had been using cocaine for approximately 3 days and she wanted to use cocaine for fourth day.  She states they got to a big argument and he left.  She stated after he left she had taken approximately 4 800 mg tablets of gabapentin and she stated that she felt drunk.  She stated that she was also using a knife to inflict self-harm to both her arms as well as attempted to do it to her throat.  She stated that at that time she felt like she wanted to kill herself but she does not feel that way now.  She states that she feels that being under the influence of the gabapentin as well as using cocaine for 3 days had a huge effect on it.  She states that she denies any suicidal or homicidal ideations and denies any hallucinations.  Patient also states that she does not want to go to residential rehabilitation and that she plans to return home to her boyfriend and asked where she is going to live.  Principal Problem: Substance induced mood disorder (HCC) Diagnosis: Principal Problem:   Substance induced mood disorder (HCC) Active Problems:   MDD (major depressive disorder)  Total Time spent with patient: 45 minutes  Past Psychiatric History: Extensive chemical dependency issues, there may have been substance-induced seizures in the past versus nonepileptic seizures  Past Medical History:  Past Medical History:  Diagnosis Date  . Anxiety disorder   . Arthritis   . Back pain, chronic   . Bipolar disorder (HCC)   . Cellulitis   . Cervicalgia   . Chicken pox   .  Chronic back pain   . COPD (chronic obstructive pulmonary disease) (HCC)   . Depression   . Drug abuse (HCC)   . GERD (gastroesophageal reflux disease)   . Hx of migraines   . Hyperlipidemia   . Insomnia   . Medical history non-contributory   . Migraine   . Nicotine dependence   . Seizure (HCC) unknown  . Seizures (HCC)    pt had 1 seizure October 2012- no other history of seizures  . Suicide attempt (HCC) 01/02/2020  . Vitamin D deficiency     Past Surgical History:  Procedure Laterality Date  . ABDOMINAL HYSTERECTOMY    . BREAST SURGERY    . CESAREAN SECTION    . CHOLECYSTECTOMY    . ESOPHAGOGASTRODUODENOSCOPY    . ESOPHAGOGASTRODUODENOSCOPY (EGD) WITH PROPOFOL N/A 09/23/2018   Procedure: ESOPHAGOGASTRODUODENOSCOPY (EGD) WITH PROPOFOL;  Surgeon: Toney Reil, MD;  Location: [facility];  Service: Gastroenterology;  Laterality: N/A;   Family History:  Family History  Problem Relation Age of Onset  . Diabetes Mother   . Depression Mother   . Anxiety disorder Mother   . Alcohol abuse Father   . Asthma Father   . COPD Father   . Early death Father   . Bipolar disorder Father   . Heart failure Sister        from chemotherapy   . Arthritis Maternal Grandmother   . Stroke  Maternal Grandmother   . Pancreatic cancer Maternal Grandfather   . Anxiety disorder Maternal Grandfather   . Depression Maternal Grandfather   . Bipolar disorder Paternal Uncle   . Bipolar disorder Cousin    Family Psychiatric  History: See above, patient does not know specifics Social History:  Social History   Substance and Sexual Activity  Alcohol Use No  . Alcohol/week: 0.0 standard drinks   Comment: none for 3 yrs     Social History   Substance and Sexual Activity  Drug Use Yes  . Types: Marijuana, Cocaine, Heroin, Oxycodone    Social History   Socioeconomic History  . Marital status: Divorced    Spouse name: Not on file  . Number of children: Not on file  . Years of  education: Not on file  . Highest education level: Not on file  Occupational History  . Not on file  Tobacco Use  . Smoking status: Current Every Day Smoker    Packs/day: 0.50    Years: 25.00    Pack years: 12.50    Types: Cigarettes  . Smokeless tobacco: Never Used  Substance and Sexual Activity  . Alcohol use: No    Alcohol/week: 0.0 standard drinks    Comment: none for 3 yrs  . Drug use: Yes    Types: Marijuana, Cocaine, Heroin, Oxycodone  . Sexual activity: Yes    Birth control/protection: None  Other Topics Concern  . Not on file  Social History Narrative  . Not on file   Social Determinants of Health   Financial Resource Strain:   . Difficulty of Paying Living Expenses:   Food Insecurity:   . Worried About Programme researcher, broadcasting/film/video in the Last Year:   . Barista in the Last Year:   Transportation Needs:   . Freight forwarder (Medical):   Marland Kitchen Lack of Transportation (Non-Medical):   Physical Activity:   . Days of Exercise per Week:   . Minutes of Exercise per Session:   Stress:   . Feeling of Stress :   Social Connections:   . Frequency of Communication with Friends and Family:   . Frequency of Social Gatherings with Friends and Family:   . Attends Religious Services:   . Active Member of Clubs or Organizations:   . Attends Banker Meetings:   Marland Kitchen Marital Status:    Additional Social History:                         Sleep: Fair  Appetite:  Fair  Current Medications: Current Facility-Administered Medications  Medication Dose Route Frequency Provider Last Rate Last Admin  . acetaminophen (TYLENOL) tablet 650 mg  650 mg Oral Q6H PRN Jearld Lesch, NP   650 mg at 01/04/20 0810  . alum & mag hydroxide-simeth (MAALOX/MYLANTA) 200-200-20 MG/5ML suspension 30 mL  30 mL Oral Q4H PRN Dixon, Rashaun M, NP      . carbamazepine (TEGRETOL XR) 12 hr tablet 100 mg  100 mg Oral BID Malvin Johns, MD   100 mg at 01/04/20 0810  . hydrOXYzine  (ATARAX/VISTARIL) tablet 50 mg  50 mg Oral TID PRN Caliope Ruppert, Gerlene Burdock, FNP      . ketorolac (TORADOL) injection 30 mg  30 mg Intramuscular Daily PRN Malvin Johns, MD   30 mg at 01/04/20 1059  . magnesium hydroxide (MILK OF MAGNESIA) suspension 30 mL  30 mL Oral Daily PRN Jearld Lesch, NP      .  mirtazapine (REMERON SOL-TAB) disintegrating tablet 15 mg  15 mg Oral QHS Malvin Johns, MD   15 mg at 01/03/20 2114  . nicotine (NICODERM CQ - dosed in mg/24 hours) patch 21 mg  21 mg Transdermal Daily Malvin Johns, MD   21 mg at 01/04/20 0811  . nitrofurantoin (macrocrystal-monohydrate) (MACROBID) capsule 100 mg  100 mg Oral Q12H Rahmir Beever, Gerlene Burdock, FNP      . prenatal multivitamin tablet 1 tablet  1 tablet Oral Q1200 Malvin Johns, MD   1 tablet at 01/04/20 1100  . traZODone (DESYREL) tablet 100 mg  100 mg Oral QHS PRN Jearld Lesch, NP   100 mg at 01/03/20 2114    Lab Results:  Results for orders placed or performed during the hospital encounter of 01/02/20 (from the past 48 hour(s))  CBC with Differential     Status: Abnormal   Collection Time: 01/02/20  8:10 PM  Result Value Ref Range   WBC 13.1 (H) 4.0 - 10.5 K/uL   RBC 2.92 (L) 3.87 - 5.11 MIL/uL   Hemoglobin 9.0 (L) 12.0 - 15.0 g/dL   HCT 16.1 (L) 09.6 - 04.5 %   MCV 89.4 80.0 - 100.0 fL   MCH 30.8 26.0 - 34.0 pg   MCHC 34.5 30.0 - 36.0 g/dL   RDW 40.9 81.1 - 91.4 %   Platelets 215 150 - 400 K/uL   nRBC 0.0 0.0 - 0.2 %   Neutrophils Relative % 82 %   Neutro Abs 10.7 (H) 1.7 - 7.7 K/uL   Lymphocytes Relative 13 %   Lymphs Abs 1.7 0.7 - 4.0 K/uL   Monocytes Relative 4 %   Monocytes Absolute 0.6 0.1 - 1.0 K/uL   Eosinophils Relative 0 %   Eosinophils Absolute 0.0 0.0 - 0.5 K/uL   Basophils Relative 0 %   Basophils Absolute 0.0 0.0 - 0.1 K/uL   Immature Granulocytes 1 %   Abs Immature Granulocytes 0.06 0.00 - 0.07 K/uL    Comment: Performed at [facility], [address] [place], [place] [address]  Comprehensive metabolic  panel     Status: Abnormal   Collection Time: 01/02/20  8:10 PM  Result Value Ref Range   Sodium 134 (L) 135 - 145 mmol/L   Potassium 3.1 (L) 3.5 - 5.1 mmol/L   Chloride 105 98 - 111 mmol/L   CO2 18 (L) 22 - 32 mmol/L   Glucose, Bld 118 (H) 70 - 99 mg/dL    Comment: Glucose reference range applies only to samples taken after fasting for at least 8 hours.   BUN 27 (H) 6 - 20 mg/dL   Creatinine, Ser 6.21 (H) 0.44 - 1.00 mg/dL   Calcium 8.5 (L) 8.9 - 10.3 mg/dL   Total Protein 7.2 6.5 - 8.1 g/dL   Albumin 4.3 3.5 - 5.0 g/dL   AST 76 (H) 15 - 41 U/L   ALT 32 0 - 44 U/L   Alkaline Phosphatase 68 38 - 126 U/L   Total Bilirubin 0.4 0.3 - 1.2 mg/dL   GFR calc non Af Amer 44 (L) >60 mL/min   GFR calc Af Amer 50 (L) >60 mL/min   Anion gap 11 5 - 15    Comment: Performed at [facility], [address] [place], [place] [address]  Ethanol     Status: None   Collection Time: 01/02/20  8:10 PM  Result Value Ref Range   Alcohol, Ethyl (B) <10 <10 mg/dL  Comment: (NOTE) Lowest detectable limit for serum alcohol is 10 mg/dL. For medical purposes only. Performed at [facility], [address] [place], [place] [address]   Acetaminophen level     Status: None   Collection Time: 01/02/20  8:10 PM  Result Value Ref Range   Acetaminophen (Tylenol), Serum 11 10 - 30 ug/mL    Comment: (NOTE) Therapeutic concentrations vary significantly. A range of 10-30 ug/mL  may be an effective concentration for many patients. However, some  are best treated at concentrations outside of this range. Acetaminophen concentrations >150 ug/mL at 4 hours after ingestion  and >50 ug/mL at 12 hours after ingestion are often associated with  toxic reactions. Performed at [facility], [address] [place], [place] [address]   Salicylate level     Status: Abnormal   Collection Time: 01/02/20  8:10 PM  Result Value Ref Range   Salicylate Lvl <7.0 (L) 7.0 - 30.0 mg/dL    Comment:  Performed at [facility], [address] [place], [place] [address]  TSH     Status: None   Collection Time: 01/02/20  8:10 PM  Result Value Ref Range   TSH 0.725 0.350 - 4.500 uIU/mL    Comment: Performed by a 3rd Generation assay with a functional sensitivity of <=0.01 uIU/mL. Performed at [facility], [address] [place], [place] [address]   Respiratory Panel by RT PCR (Flu A&B, Covid) - Nasopharyngeal Swab     Status: None   Collection Time: 01/02/20  9:17 PM   Specimen: Nasopharyngeal Swab  Result Value Ref Range   SARS Coronavirus 2 by RT PCR NEGATIVE NEGATIVE    Comment: (NOTE) SARS-CoV-2 target nucleic acids are NOT DETECTED. The SARS-CoV-2 RNA is generally detectable in upper respiratoy specimens during the acute phase of infection. The lowest concentration of SARS-CoV-2 viral copies this assay can detect is 131 copies/mL. A negative result does not preclude SARS-Cov-2 infection and should not be used as the sole basis for treatment or other patient management decisions. A negative result may occur with  improper specimen collection/handling, submission of specimen other than nasopharyngeal swab, presence of viral mutation(s) within the areas targeted by this assay, and inadequate number of viral copies (<131 copies/mL). A negative result must be combined with clinical observations, patient history, and epidemiological information. The expected result is Negative. Fact Sheet for Patients:  https://www.moore.com/ Fact Sheet for Healthcare Providers:  https://www.young.biz/ This test is not yet ap proved or cleared by the [place] FDA and  has been authorized for detection and/or diagnosis of SARS-CoV-2 by FDA under an Emergency Use Authorization (EUA). This EUA will remain  in effect (meaning this test can be used) for the duration of the COVID-19 declaration under Section 564(b)(1) of the Act, 21  U.S.C. section 360bbb-3(b)(1), unless the authorization is terminated or revoked sooner.    Influenza A by PCR NEGATIVE NEGATIVE   Influenza B by PCR NEGATIVE NEGATIVE    Comment: (NOTE) The Xpert Xpress SARS-CoV-2/FLU/RSV assay is intended as an aid in  the diagnosis of influenza from Nasopharyngeal swab specimens and  should not be used as a sole basis for treatment. Nasal washings and  aspirates are unacceptable for Xpert Xpress SARS-CoV-2/FLU/RSV  testing. Fact Sheet for Patients: https://www.moore.com/ Fact Sheet for Healthcare Providers: https://www.young.biz/ This test is not yet approved or cleared by the [place] FDA and  has been authorized for detection and/or diagnosis of SARS-CoV-2 by  FDA under an  Emergency Use Authorization (EUA). This EUA will remain  in effect (meaning this test can be used) for the duration of the  Covid-19 declaration under Section 564(b)(1) of the Act, 21  U.S.C. section 360bbb-3(b)(1), unless the authorization is  terminated or revoked. Performed at [facility], [address] [place], [place] [address]   Urinalysis, Complete w Microscopic     Status: Abnormal   Collection Time: 01/03/20 12:38 AM  Result Value Ref Range   Color, Urine YELLOW (A) YELLOW   APPearance CLOUDY (A) CLEAR   Specific Gravity, Urine 1.013 1.005 - 1.030   pH 5.0 5.0 - 8.0   Glucose, UA NEGATIVE NEGATIVE mg/dL   Hgb urine dipstick MODERATE (A) NEGATIVE   Bilirubin Urine NEGATIVE NEGATIVE   Ketones, ur NEGATIVE NEGATIVE mg/dL   Protein, ur 30 (A) NEGATIVE mg/dL   Nitrite POSITIVE (A) NEGATIVE   Leukocytes,Ua LARGE (A) NEGATIVE   RBC / HPF 11-20 0 - 5 RBC/hpf   WBC, UA >50 (H) 0 - 5 WBC/hpf   Bacteria, UA MANY (A) NONE SEEN   Squamous Epithelial / LPF 0-5 0 - 5   WBC Clumps PRESENT    Granular Casts, UA PRESENT     Comment: Performed at [facility], [address] [place], [place] [address]   Urine Drug Screen, Qualitative ([facility] only)     Status: Abnormal   Collection Time: 01/03/20 12:38 AM  Result Value Ref Range   Tricyclic, Ur Screen NONE DETECTED NONE DETECTED   Amphetamines, Ur Screen POSITIVE (A) NONE DETECTED   MDMA (Ecstasy)Ur Screen NONE DETECTED NONE DETECTED   Cocaine Metabolite,Ur [address] POSITIVE (A) NONE DETECTED   Opiate, Ur Screen NONE DETECTED NONE DETECTED   Phencyclidine (PCP) Ur S NONE DETECTED NONE DETECTED   Cannabinoid 50 Ng, Ur [place] NONE DETECTED NONE DETECTED   Barbiturates, Ur Screen POSITIVE (A) NONE DETECTED   Benzodiazepine, Ur Scrn POSITIVE (A) NONE DETECTED   Methadone Scn, Ur NONE DETECTED NONE DETECTED    Comment: (NOTE) Tricyclics + metabolites, urine    Cutoff 1000 ng/mL Amphetamines + metabolites, urine  Cutoff 1000 ng/mL MDMA (Ecstasy), urine              Cutoff 500 ng/mL Cocaine Metabolite, urine          Cutoff 300 ng/mL Opiate + metabolites, urine        Cutoff 300 ng/mL Phencyclidine (PCP), urine         Cutoff 25 ng/mL Cannabinoid, urine                 Cutoff 50 ng/mL Barbiturates + metabolites, urine  Cutoff 200 ng/mL Benzodiazepine, urine              Cutoff 200 ng/mL Methadone, urine                   Cutoff 300 ng/mL The urine drug screen provides only a preliminary, unconfirmed analytical test result and should not be used for non-medical purposes. Clinical consideration and professional judgment should be applied to any positive drug screen result due to possible interfering substances. A more specific alternate chemical method must be used in order to obtain a confirmed analytical result. Gas chromatography / mass spectrometry (GC/MS) is the preferred confirmat ory method. Performed at [facility], [address], [place], [place] [address]     Blood Alcohol level:  Lab Results  Component Value Date   [facility] <10 01/02/2020   ETH <  10 03/31/2019    Metabolic Disorder Labs: No results found for: HGBA1C,  MPG No results found for: PROLACTIN Lab Results  Component Value Date   CHOL 207 (H) 11/23/2008   TRIG 242 (H) 11/23/2008    Physical Findings: AIMS: Facial and Oral Movements Muscles of Facial Expression: None, normal Lips and Perioral Area: None, normal Jaw: None, normal Tongue: None, normal,Extremity Movements Upper (arms, wrists, hands, fingers): None, normal Lower (legs, knees, ankles, toes): None, normal, Trunk Movements Neck, shoulders, hips: None, normal, Overall Severity Severity of abnormal movements (highest score from questions above): None, normal Incapacitation due to abnormal movements: None, normal Patient's awareness of abnormal movements (rate only patient's report): No Awareness, Dental Status Current problems with teeth and/or dentures?: No Does patient usually wear dentures?: No  CIWA:    COWS:     Musculoskeletal: Strength & Muscle Tone: within normal limits Gait & Station: normal Patient leans: N/A  Psychiatric Specialty Exam: Physical Exam  Nursing note and vitals reviewed. Constitutional: She is oriented to person, place, and time. She appears well-developed and well-nourished.  Cardiovascular: Normal rate.  Respiratory: Effort normal.  Musculoskeletal:        General: Normal range of motion.  Neurological: She is alert and oriented to person, place, and time.  Skin: Skin is warm.       Review of Systems  Constitutional: Negative.   HENT: Negative.   Eyes: Negative.   Respiratory: Negative.   Cardiovascular: Negative.   Gastrointestinal: Negative.   Genitourinary: Negative.   Musculoskeletal: Negative.   Skin: Negative.        Numerous lacerations to bilateral forearms  Neurological: Negative.     Blood pressure 118/71, pulse 99, temperature 98.6 F (37 C), temperature source Oral, resp. rate 18, height 5\' 7"  (1.702 m), weight 49.9 kg, SpO2 100 %.Body mass index is 17.23 kg/m.  General Appearance: Disheveled  Eye Contact:  Good   Speech:  Clear and Coherent and Normal Rate  Volume:  Decreased  Mood:  Anxious  Affect:  Congruent  Thought Process:  Coherent and Descriptions of Associations: Intact  Orientation:  Full (Time, Place, and Person)  Thought Content:  WDL  Suicidal Thoughts:  No  Homicidal Thoughts:  No  Memory:  Immediate;   Fair Recent;   Fair Remote;   Fair  Judgement:  Fair  Insight:  Fair  Psychomotor Activity:  Normal  Concentration:  Concentration: Fair  Recall:  FiservFair  Fund of Knowledge:  Fair  Language:  Fair  Akathisia:  No  Handed:  Right  AIMS (if indicated):     Assets:  Desire for Improvement Housing Resilience Social Support  ADL's:  Intact  Cognition:  WNL  Sleep:  Number of Hours: 8   Assessment: Patient presents in her room lying in the bed but is awake.  Patient is talkative and easy to compensate with.  Patient has continued to deny any suicidal or homicidal ideations and denies any hallucinations.  Patient does feel that the substances that she was using had a big impact on what she did to her self.  She continues to express regret and remorse for doing this to her self.  She states she has spoken to her boyfriend and has apologized to him for her behavior and what she did.  She stated that she does not really have a good reason for why she did it and it was kind of impulsive along with substances that she was taking.  Reviewed patient's chart  it appears the patient has a UTI and will order Macrobid as she stated she has taken this before.  Patient's arms have multiple bandages and have been removed by the RN.  I have also ordered a wound care consult due to the numerous lacerations with some having sutures.  Patient has reported that the medications we are giving her seem to be working well for her and she is okay with those.  She also requested for her Vistaril to be increased due to her increased anxiety.  Treatment Plan Summary: Daily contact with patient to assess and  evaluate symptoms and progress in treatment and Medication management Continue Tegretol-XR 100 mg twice daily Increase Vistaril 50 mg p.o. 3 times daily as needed for anxiety Continue Toradol 30 mg IM daily as needed for severe pain Continue Remeron sublingual tablet 15 mg nightly for depression and sleep Start nitrofurantoin 100 mg p.o. every 12 hours for UTI Continue trazodone 100 mg p.o. nightly as needed for insomnia Encourage group therapy participation Continue every 15 minute safety checks Wound care consult placed for lacerations on bilateral forearms CBC and CMP ordered for tomorrow morning  Maryfrances Bunnell, FNP 01/04/2020, 11:33 AM

## 2020-01-04 NOTE — Plan of Care (Signed)
D- Patient alert and oriented. Patient presents in a depressed, but pleasant mood on assessment stating that she did not sleep last night. Patient continues to endorse bilateral arm pain, rating it a "10/10", in which she did request medication from this Clinical research associate. Patient continues to endorse depression and anxiety, however, she did not elaborate on why she's feeling this way. Patient denies SI, HI, AVH at this time. Patient's goal for today is "happiness", in which she will "chat with someone", in order to achieve her goals.  A- Scheduled medications administered to patient, per MD orders. Support and encouragement provided.  Routine safety checks conducted every 15 minutes.  Patient informed to notify staff with problems or concerns.  R- No adverse drug reactions noted. Patient contracts for safety at this time. Patient compliant with medications and treatment plan. Patient receptive, calm, and cooperative. Patient interacts well with others on the unit.  Patient remains safe at this time.  Problem: Education: Goal: Knowledge of [place] [facility] Education information/materials will improve Outcome: Progressing Goal: Emotional status will improve Outcome: Progressing Goal: Mental status will improve Outcome: Progressing Goal: Verbalization of understanding the information provided will improve Outcome: Progressing   Problem: Activity: Goal: Interest or engagement in activities will improve Outcome: Progressing Goal: Sleeping patterns will improve Outcome: Progressing   Problem: Coping: Goal: Ability to verbalize frustrations and anger appropriately will improve Outcome: Progressing Goal: Ability to demonstrate self-control will improve Outcome: Progressing   Problem: Health Behavior/Discharge Planning: Goal: Identification of resources available to assist in meeting health care needs will improve Outcome: Progressing Goal: Compliance with treatment plan for underlying cause of  condition will improve Outcome: Progressing   Problem: Safety: Goal: Periods of time without injury will increase Outcome: Progressing

## 2020-01-04 NOTE — [facility] Group Notes (Signed)
[facility] Group Notes:  (Nursing/MHT/Case Management/Adjunct)  Date:  01/04/2020  Time:  9:54 AM  Type of Therapy:  Community Meeting  Participation Level:  Did Not Attend   Lynelle Smoke [facility] 01/04/2020, 9:54 AM

## 2020-01-04 NOTE — Progress Notes (Signed)
Patient was on the phone upon arrival to the unit. Pt was pleasant during assessment denying SI/HI/AVH and pain with this Clinical research associate. Pt endorses anxiety (7/10) and depression (7/10). Pt compliant with medication administration per MD orders and was observed interacting appropriately with staff and peers on the unit by this Clinical research associate. Patient stated her cuts were doing better. Bandages were changed on day shift today. Patient given education, support and encouragement to be active in her treatment plan. Patient being monitored Q 15 minutes for safety per unit protocol and remains safe on the unit.

## 2020-01-04 NOTE — Plan of Care (Signed)
Patient stated she was feeling better tonight but did endorse anxiety and depression  Problem: Education: Goal: Emotional status will improve Outcome: Progressing Goal: Mental status will improve Outcome: Progressing

## 2020-01-04 NOTE — [facility] Group Notes (Signed)
LCSW Group Therapy Note  01/04/2020 1:44 PM  Type of Therapy and Topic:  Group Therapy:  Feelings around Relapse and Recovery  Participation Level:  Did Not Attend   Description of Group:    Patients in this group will discuss emotions they experience before and after a relapse. They will process how experiencing these feelings, or avoidance of experiencing them, relates to having a relapse. Facilitator will guide patients to explore emotions they have related to recovery. Patients will be encouraged to process which emotions are more powerful. They will be guided to discuss the emotional reaction significant others in their lives may have to their relapse or recovery. Patients will be assisted in exploring ways to respond to the emotions of others without this contributing to a relapse.  Therapeutic Goals: 1. Patient will identify two or more emotions that lead to a relapse for them 2. Patient will identify two emotions that result when they relapse 3. Patient will identify two emotions related to recovery 4. Patient will demonstrate ability to communicate their needs through discussion and/or role plays   Summary of Patient Progress: x    Therapeutic Modalities:   Cognitive Behavioral Therapy Solution-Focused Therapy Assertiveness Training Relapse Prevention Therapy   Iris Pert, MSW, LCSW Clinical Social Work 01/04/2020 1:44 PM

## 2020-01-04 NOTE — Consult Note (Signed)
WOC Nurse Consult Note: Reason for Consult:Patient with self inflicted lacerations.  Some were sutured in ED if deep enough.  TOpical wound care will be recommended for all sites.  Wound type:trauma Pressure Injury POA: NA Measurement:multiple linear lacerations, self inflicted  Wound EKB:TCYELYH and pink and moist Drainage (amount, consistency, odor) serosanguinous  Oozing with no odor. Periwound:faint erythema present Dressing procedure/placement/frequency: Bedside RN to perform. Cleanse bilateral lacerations to arms with NS and pat dry.  Cover with Xeroform gauze.  Secure with dry gauze and kerlix/tape.  Change daily.   Will not follow at this time.  Please re-consult if needed.  Maple Hudson MSN, RN, FNP-BC CWON Wound, Ostomy, Continence Nurse Pager [phone]

## 2020-01-04 NOTE — Progress Notes (Signed)
Recreation Therapy Notes  Date: 01/04/2020  Time: 9:30 am   Location: Craft room   Behavioral response: N/A   Intervention Topic: Coping Skills  Discussion/Intervention: Patient did not attend group.   Clinical Observations/Feedback:  Patient did not attend group.   Jaonna Word LRT/CTRS        Filippa Yarbough 01/04/2020 11:16 AM

## 2020-01-05 ENCOUNTER — Inpatient Hospital Stay: Payer: Self-pay

## 2020-01-05 DIAGNOSIS — F1994 Other psychoactive substance use, unspecified with psychoactive substance-induced mood disorder: Secondary | ICD-10-CM | POA: Diagnosis not present

## 2020-01-05 LAB — CBC
HCT: 27.4 % — ABNORMAL LOW (ref 36.0–46.0)
Hemoglobin: 9 g/dL — ABNORMAL LOW (ref 12.0–15.0)
MCH: 31.1 pg (ref 26.0–34.0)
MCHC: 32.8 g/dL (ref 30.0–36.0)
MCV: 94.8 fL (ref 80.0–100.0)
Platelets: 210 10*3/uL (ref 150–400)
RBC: 2.89 MIL/uL — ABNORMAL LOW (ref 3.87–5.11)
RDW: 13.8 % (ref 11.5–15.5)
WBC: 8.7 10*3/uL (ref 4.0–10.5)
nRBC: 0 % (ref 0.0–0.2)

## 2020-01-05 LAB — COMPREHENSIVE METABOLIC PANEL
ALT: 21 U/L (ref 0–44)
AST: 37 U/L (ref 15–41)
Albumin: 3.9 g/dL (ref 3.5–5.0)
Alkaline Phosphatase: 61 U/L (ref 38–126)
Anion gap: 13 (ref 5–15)
BUN: 13 mg/dL (ref 6–20)
CO2: 21 mmol/L — ABNORMAL LOW (ref 22–32)
Calcium: 8.9 mg/dL (ref 8.9–10.3)
Chloride: 108 mmol/L (ref 98–111)
Creatinine, Ser: 0.54 mg/dL (ref 0.44–1.00)
GFR calc Af Amer: >60 mL/min (ref >60)
GFR calc non Af Amer: >60 mL/min (ref >60)
Glucose, Bld: 98 mg/dL (ref 70–99)
Potassium: 3.5 mmol/L (ref 3.5–5.1)
Sodium: 142 mmol/L (ref 135–145)
Total Bilirubin: 0.4 mg/dL (ref 0.3–1.2)
Total Protein: 7 g/dL (ref 6.5–8.1)

## 2020-01-05 MED ORDER — MIRTAZAPINE 30 MG PO TABS: 30.0000 mg | ORAL_TABLET | Freq: Every day | ORAL | 0 refills | Status: DC

## 2020-01-05 MED ORDER — MIRTAZAPINE 15 MG PO TABS: 30.0000 mg | ORAL_TABLET | Freq: Every day | ORAL | Status: DC

## 2020-01-05 MED ORDER — TRAZODONE HCL 100 MG PO TABS: 100.0000 mg | ORAL_TABLET | Freq: Every evening | ORAL | 1 refills | Status: DC | PRN

## 2020-01-05 MED ORDER — MIRTAZAPINE 30 MG PO TABS: 30.0000 mg | ORAL_TABLET | Freq: Every day | ORAL | 1 refills | Status: DC

## 2020-01-05 NOTE — Progress Notes (Signed)
Patient ID: Erin [name], female   DOB: [birth year], 49 y.o.   MRN: [id number]  Discharge Note:  Patient denies SI/HI/AVH at this time. Discharge instructions, AVS, prescriptions, transition record, and seven-day supply gone over with patient. Patient agrees to comply with medication management, however, she declined follow-up services. Patient belongings returned to patient. Patient questions and concerns addressed and answered. Patient ambulatory off unit. Patient discharged to home with boyfriend.

## 2020-01-05 NOTE — [facility] Counselor (Signed)
CSW met with patient regarding aftercare.  Patient declined residential and outpatient and reports that she plans on going to a [facility].   , MSW, LCSW 01/05/2020 10:33 AM  

## 2020-01-05 NOTE — [facility] Counselor (Signed)
CSW spoke with patient and provided the patient with local community resources.   Patient was receptive to the list.   Penni Homans, MSW, LCSW 01/05/2020 3:09 PM

## 2020-01-05 NOTE — [facility] Group Notes (Signed)
[facility] Group Notes:  (Nursing/MHT/Case Management/Adjunct)  Date:  01/05/2020  Time:  1:08 PM  Type of Therapy:  Community Meeting  Participation Level:  Did Not Attend   Lynelle Smoke [facility] 01/05/2020, 1:08 PM
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
## 2020-01-05 NOTE — [facility] Suicide Risk Assessment (Signed)
[facility] Discharge Suicide Risk Assessment   Principal Problem: Substance induced mood disorder ([facility]) Discharge Diagnoses: Principal Problem:   Substance induced mood disorder ([facility]) Active Problems:   MDD (major depressive disorder)   Total Time spent with patient: 30 minutes  Musculoskeletal: Strength & Muscle Tone: within normal limits Gait & Station: normal Patient leans: N/A  Psychiatric Specialty Exam: Review of Systems  Constitutional: Negative.   HENT: Negative.   Eyes: Negative.   Respiratory: Negative.   Cardiovascular: Negative.   Gastrointestinal: Negative.   Musculoskeletal: Negative.   Skin: Negative.   Neurological: Negative.   Psychiatric/Behavioral: Negative.     Blood pressure 123/73, pulse 95, temperature 98.5 F (36.9 C), temperature source Oral, resp. rate 18, height 5\' 7"  (1.702 m), weight 49.9 kg, SpO2 100 %.Body mass index is 17.23 kg/m.  General Appearance: Disheveled  Eye ::  Fair  Speech:  Normal Rate409  Volume:  Normal  Mood:  Euthymic  Affect:  Congruent  Thought Process:  Goal Directed  Orientation:  Full (Time, Place, and Person)  Thought Content:  Logical  Suicidal Thoughts:  No  Homicidal Thoughts:  No  Memory:  Immediate;   Fair Recent;   Fair Remote;   Fair  Judgement:  Fair  Insight:  Fair  Psychomotor Activity:  Normal  Concentration:  Fair  Recall:  [phone] of Knowledge:Fair  Language: Fair  Akathisia:  No  Handed:  Right  AIMS (if indicated):     Assets:  Desire for Improvement Housing Resilience Social Support  Sleep:  Number of Hours: 8  Cognition: WNL  ADL's:  Intact   Mental Status Per Nursing Assessment::   On Admission:  Suicidal ideation indicated by patient  Demographic Factors:  Divorced or widowed and Caucasian  Loss Factors: Financial problems/change in socioeconomic status  Historical Factors: Impulsivity  Risk Reduction Factors:   Sense of responsibility to family, Positive social  support and Positive therapeutic relationship  Continued Clinical Symptoms:  Depression:   Comorbid alcohol abuse/dependence Alcohol/Substance Abuse/Dependencies  Cognitive Features That Contribute To Risk:  None    Suicide Risk:  Minimal: No identifiable suicidal ideation.  Patients presenting with no risk factors but with morbid ruminations; may be classified as minimal risk based on the severity of the depressive symptoms  Follow-up Information    Pt Declined Follow up.   Why: Patient has declined follow up. Pt has been provided with a list of community mental health agencies.          Plan Of Care/Follow-up recommendations:  Activity:  Activity as tolerated Diet:  Regular diet Other:  Please follow-up with outpatient mental health and substance abuse treatment  [phone], MD 01/05/2020, 1:54 PM

## 2020-01-05 NOTE — Progress Notes (Signed)
Recreation Therapy Notes  Date: 01/05/2020  Time: 9:30 am   Location: Craft room   Behavioral response: N/A   Intervention Topic: Self-care   Discussion/Intervention: Patient did not attend group.   Clinical Observations/Feedback:  Patient did not attend group.   Celita Aron LRT/CTRS         Shuntia Exton 01/05/2020 11:37 AM 
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
## 2020-01-05 NOTE — Progress Notes (Signed)
Patient's dressings were changed per MD orders. Patient tolerated dressing change well, without any issues, and all questions/concerns have been addressed and answered at this time.
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
## 2020-01-05 NOTE — Progress Notes (Signed)
D- Patient alert and oriented. Patient presents in an anxious, but pleasant mood on assessment stating that she did not sleep last night. Patient continues to endorse a pain level of "10/10", and she did request pain medication from this Clinical research associate. Patient did not report any depression, however, she is extremely anxious, but did not go into much detail other than being worried about her sleep issues. Patient denies SI, HI, AVH at this time. Patient had no stated goals for today.  A- Scheduled medications administered to patient, per MD orders. Support and encouragement provided.  Routine safety checks conducted every 15 minutes.  Patient informed to notify staff with problems or concerns.  R- No adverse drug reactions noted. Patient contracts for safety at this time. Patient compliant with medications and treatment plan. Patient receptive, calm, and cooperative. Patient interacts well with others on the unit.  Patient remains safe at this time.

## 2020-06-09 IMAGING — CR DG CHEST 2V
1 series · 2 of 2 positions shown · non-contrast
Comparison: Two-view chest x-ray 09/13/2009

CLINICAL DATA: Cough.  Nausea.  Vomiting.

EXAM:
CHEST - 2 VIEW

[Series 1: dg chest 2 view · 0.14mm/px · 2 of 2 slices shown]
[im 1/2]
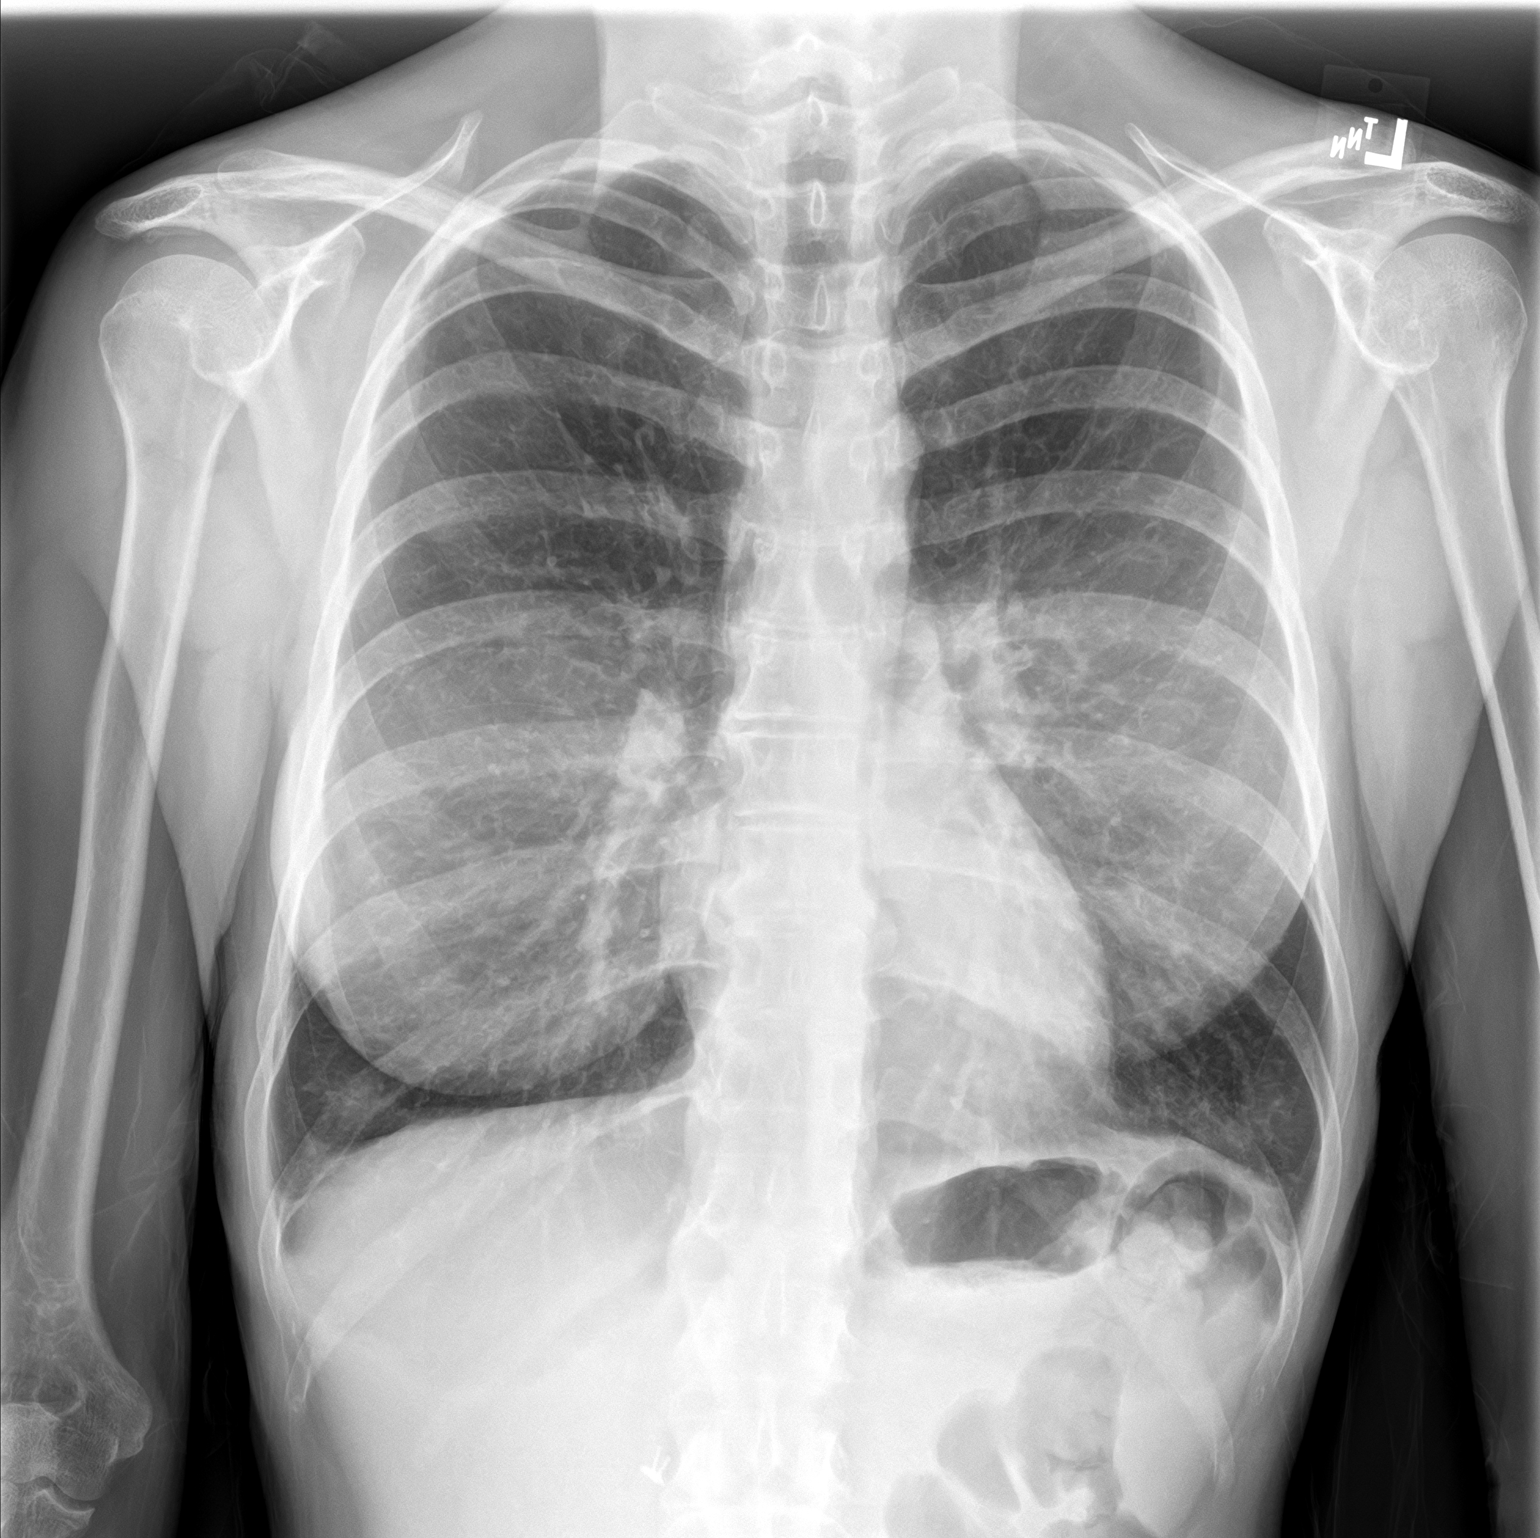
[im 2/2]
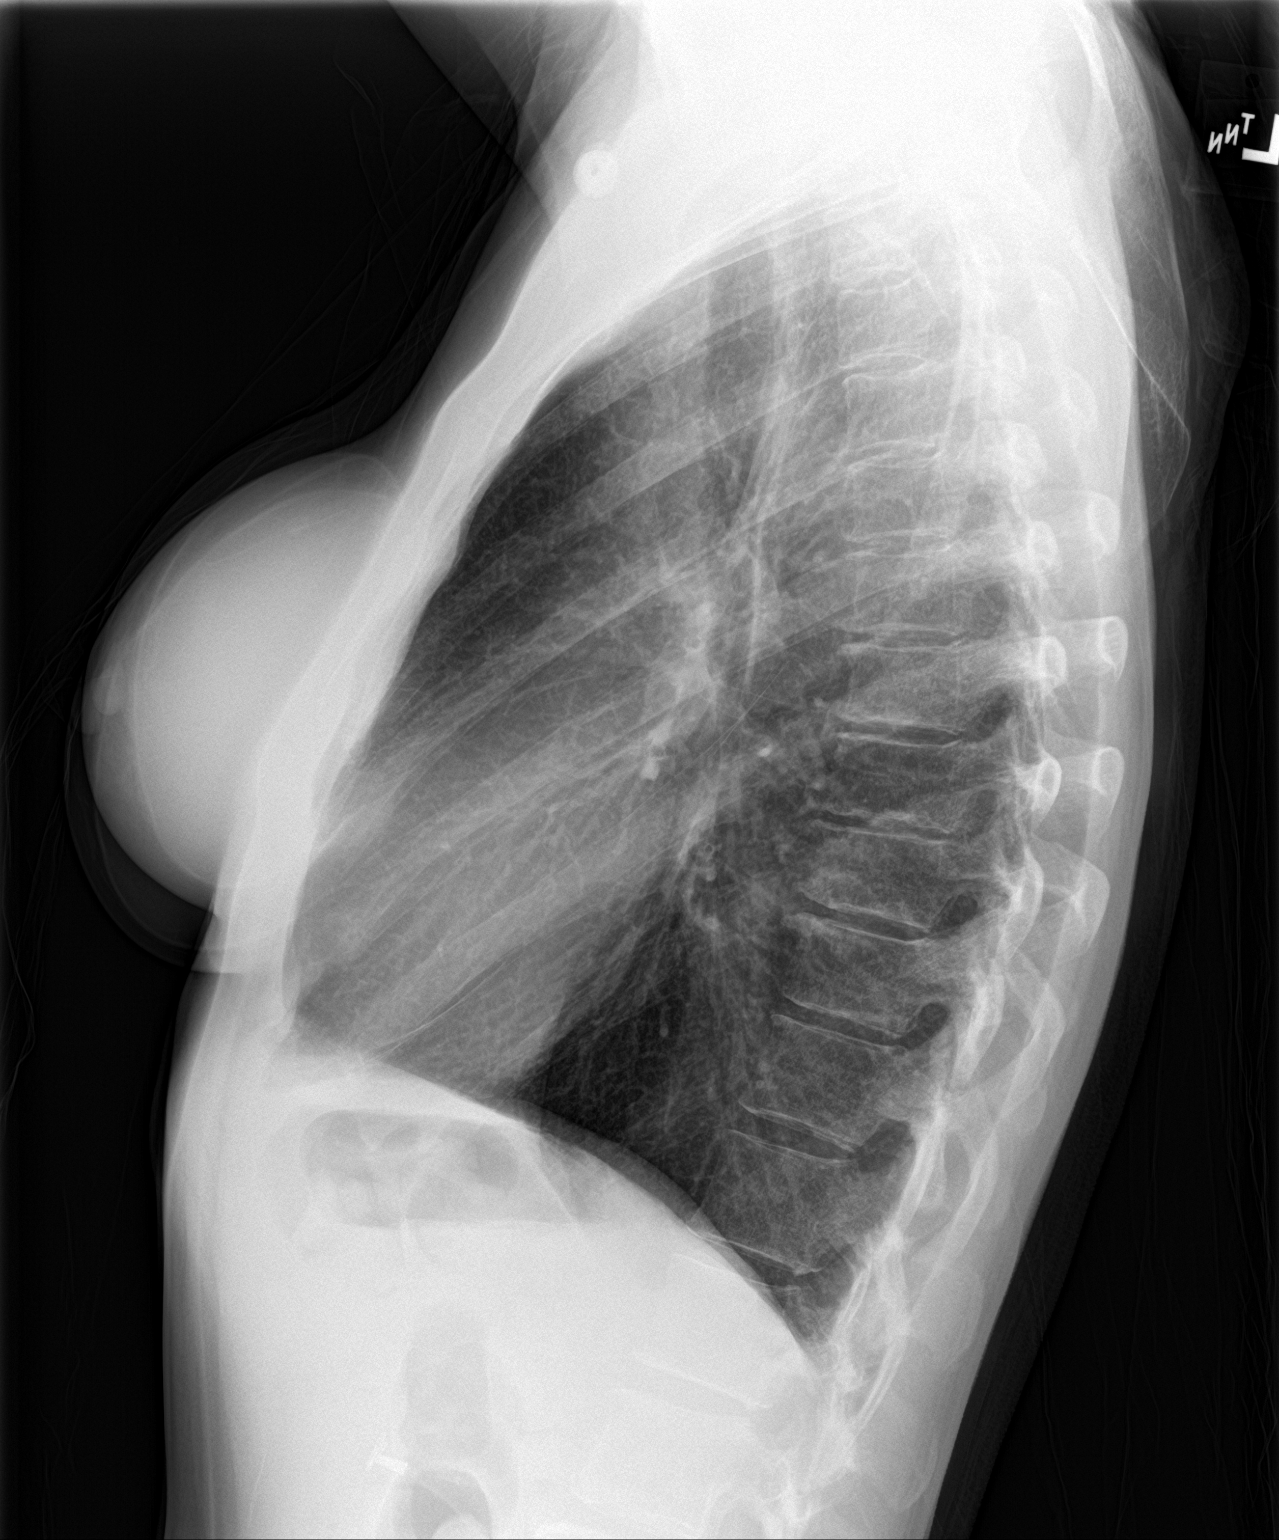

[2 of 2 positions shown; findings below may reference images not displayed]

FINDINGS: The heart size and mediastinal contours are within normal limits.
Both lungs are clear. The visualized skeletal structures are
unremarkable.
IMPRESSION: Negative two view chest x-ray

## 2020-06-09 IMAGING — CT CT ABD-PELV W/ CM
2 of 5 series · 16 of 46 positions shown, 18 images · IV contrast (APPLIED)
Comparison: CT abdomen pelvis 01/25/2009

CLINICAL DATA: Patient with generalized abdominal pain.

EXAM:
CT ABDOMEN AND PELVIS WITH CONTRAST
TECHNIQUE: Multidetector CT imaging of the abdomen and pelvis was performed
using the standard protocol following bolus administration of
intravenous contrast.
CONTRAST:  100mL BQG31D-ESS IOPAMIDOL (BQG31D-ESS) INJECTION 61%

[Series 2: routine abd/pel with · axial · 0.67mm/px · z∈[-1028,-633]mm · 13 of 89 slices shown, 15 images]
[im 5/89  soft-tissue]
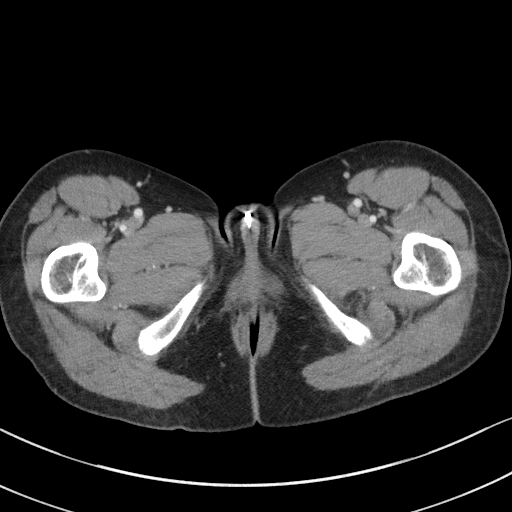
[im 5/89  bone]
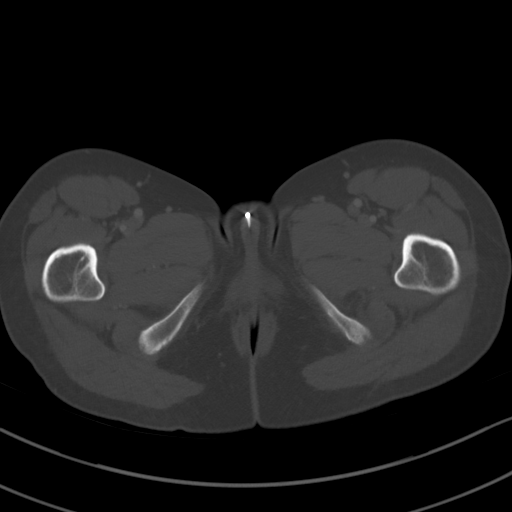
[im 14/89  soft-tissue]
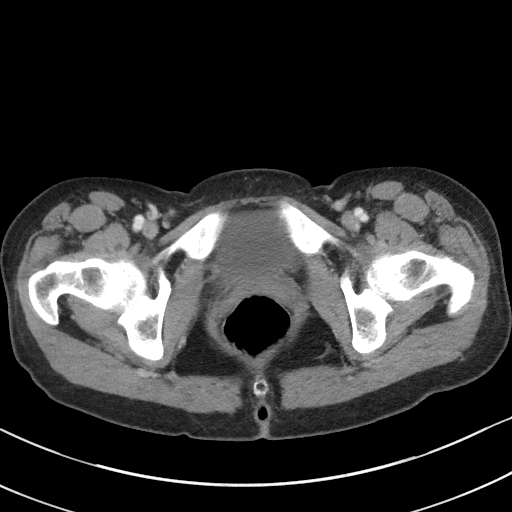
[im 18/89  soft-tissue]
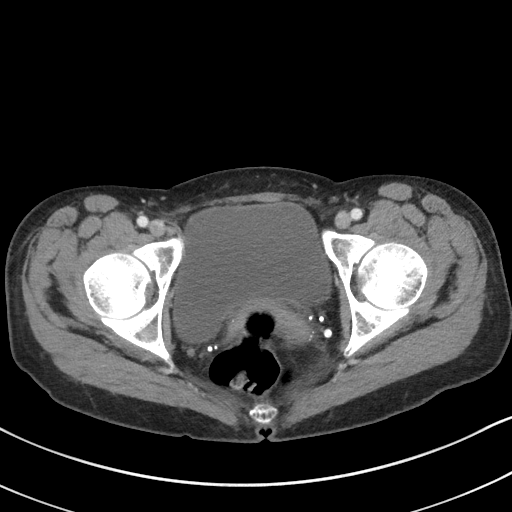
[im 27/89  soft-tissue]
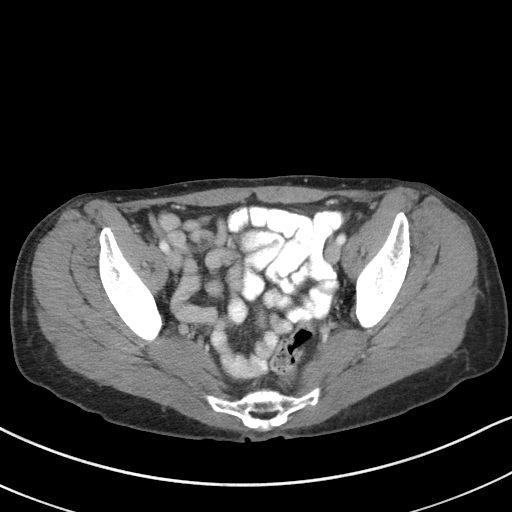
[im 31/89  soft-tissue]
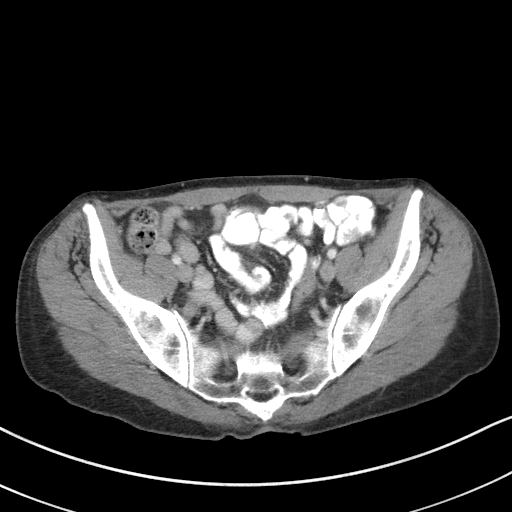
[im 40/89  soft-tissue]
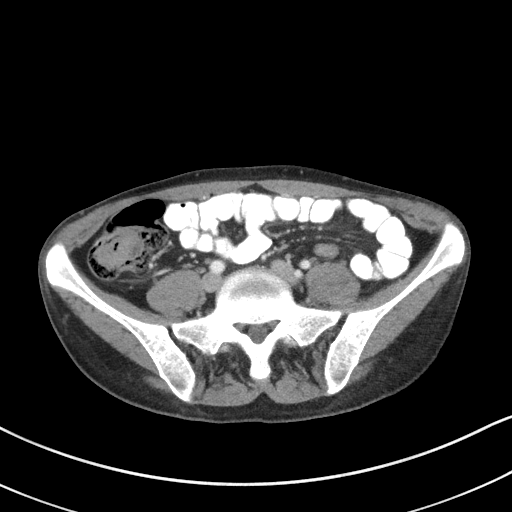
[im 45/89  soft-tissue]
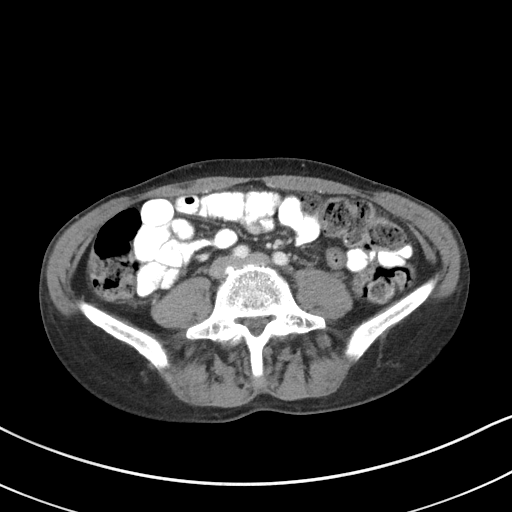
[im 49/89  soft-tissue]
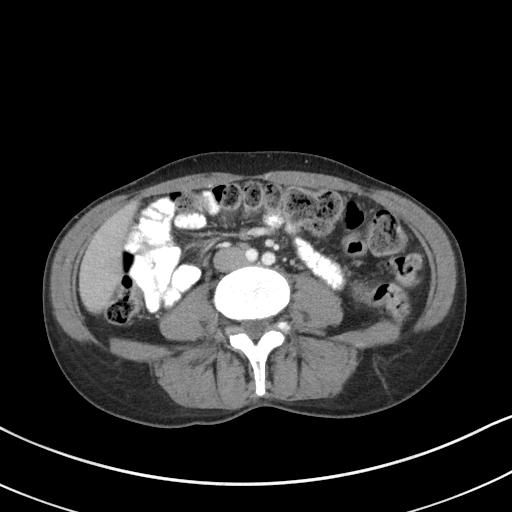
[im 58/89  soft-tissue]
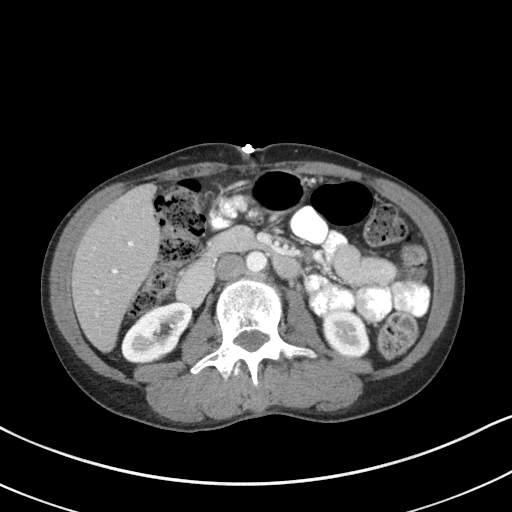
[im 58/89  bone]
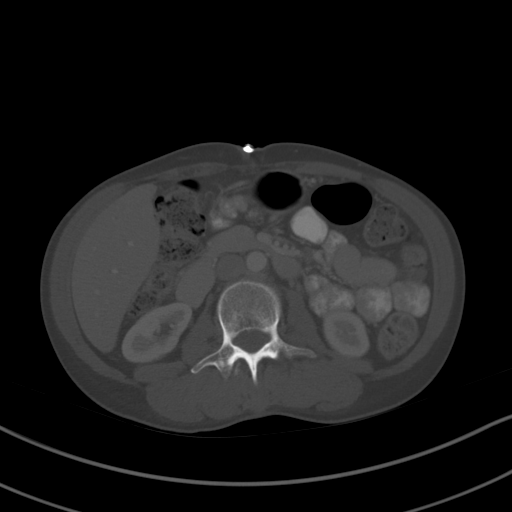
[im 62/89  soft-tissue]
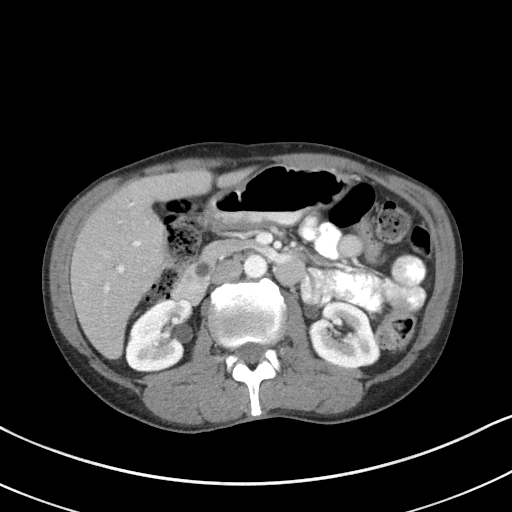
[im 71/89  soft-tissue]
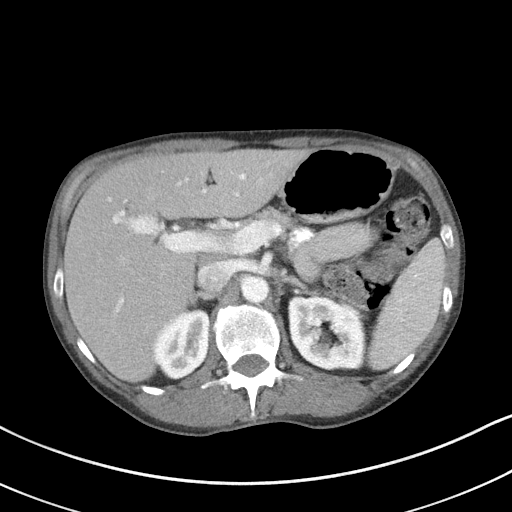
[im 75/89  soft-tissue]
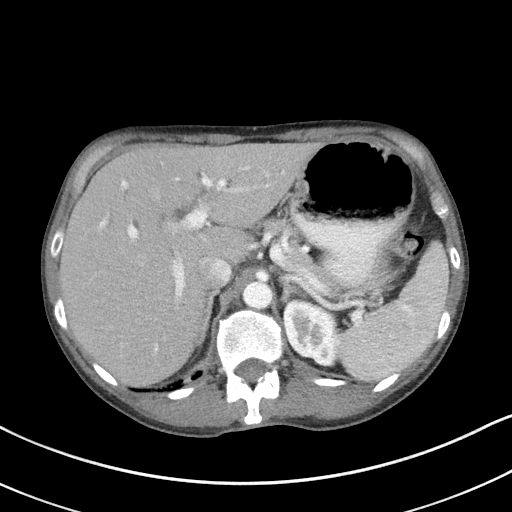
[im 84/89  soft-tissue]
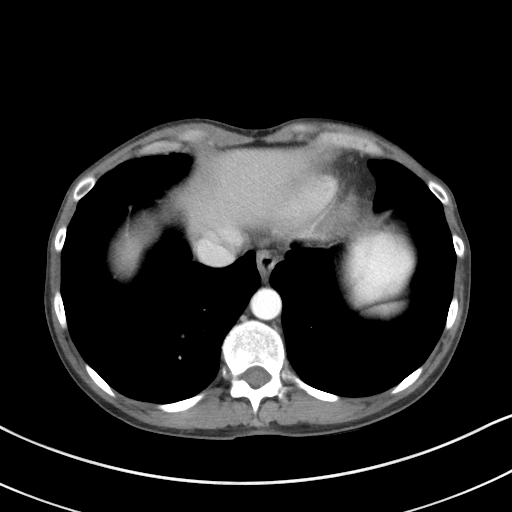

[Series 5: coronal st · coronal · 0.68mm/px · 3 of 69 slices shown]
[im 23/69  soft-tissue]
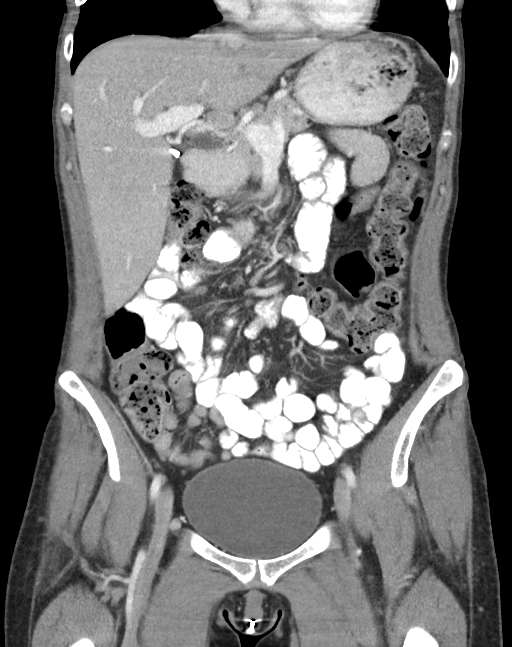
[im 31/69  soft-tissue]
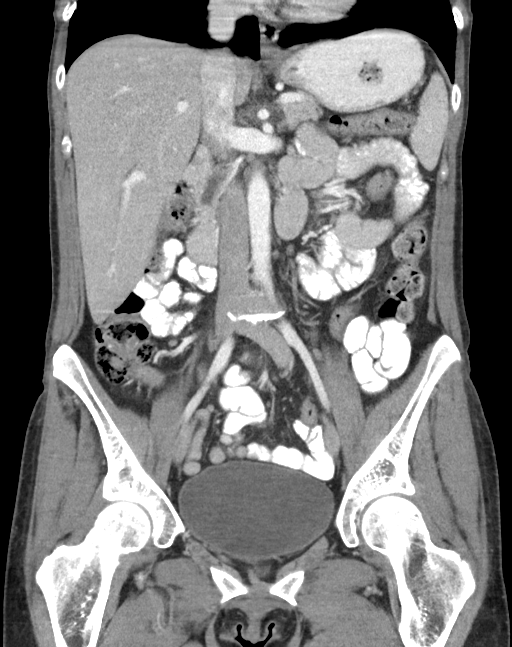
[im 38/69  soft-tissue]
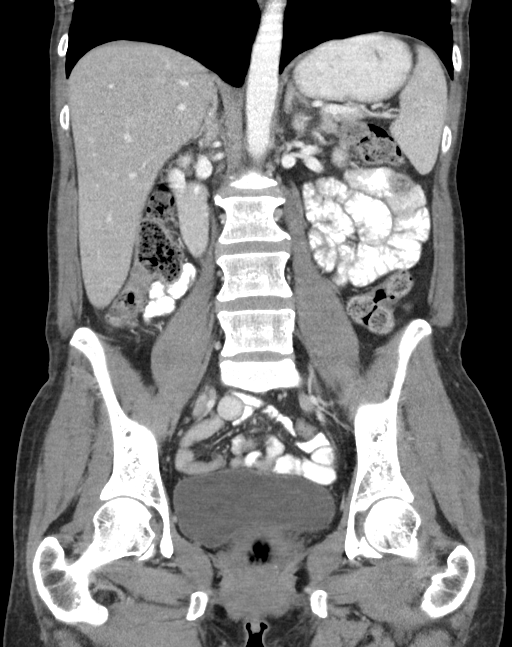

[16 of 46 positions shown; findings below may reference images not displayed]

FINDINGS: Lower chest: Normal heart size. Dependent atelectasis right lower
lobe. No pleural effusion.

Hepatobiliary: Liver is normal in size and contour. No focal lesion
identified. Prior cholecystectomy. Common bile duct is dilated
measuring 8 mm, likely physiologic from cholecystectomy state.

Pancreas: Unremarkable

Spleen: Unremarkable

Adrenals/Urinary Tract: Normal adrenal glands. Kidneys enhance
symmetrically with contrast. No hydronephrosis. Urinary bladder is
unremarkable.

Stomach/Bowel: No abnormal bowel wall thickening or evidence for
bowel obstruction. No free fluid or free intraperitoneal air. Normal
morphology of the stomach.

Vascular/Lymphatic: Normal caliber abdominal aorta. Peripheral
calcified atherosclerotic plaque. No retroperitoneal
lymphadenopathy.

Reproductive: Uterus and adnexal structures unremarkable.

Other: None.

Musculoskeletal: No aggressive or acute appearing osseous lesions.
Lower thoracic and lumbar spine degenerative changes.
IMPRESSION: No acute process within the abdomen or pelvis.

## 2020-06-10 ENCOUNTER — Encounter: Payer: Self-pay | Admitting: Emergency Medicine

## 2020-06-10 ENCOUNTER — Other Ambulatory Visit: Payer: Self-pay

## 2020-06-10 ENCOUNTER — Emergency Department
Admission: EM | Admit: 2020-06-10 | Discharge: 2020-06-10 | Disposition: A | Discharge status: Home / Self Care | Payer: Self-pay | Attending: Emergency Medicine | Admitting: Emergency Medicine

## 2020-06-10 ENCOUNTER — Emergency Department: Payer: Self-pay

## 2020-06-10 DIAGNOSIS — J449 Chronic obstructive pulmonary disease, unspecified: Secondary | ICD-10-CM | POA: Insufficient documentation

## 2020-06-10 DIAGNOSIS — F17210 Nicotine dependence, cigarettes, uncomplicated: Secondary | ICD-10-CM | POA: Insufficient documentation

## 2020-06-10 DIAGNOSIS — M5431 Sciatica, right side: Secondary | ICD-10-CM

## 2020-06-10 DIAGNOSIS — Z79899 Other long term (current) drug therapy: Secondary | ICD-10-CM | POA: Insufficient documentation

## 2020-06-10 DIAGNOSIS — M5441 Lumbago with sciatica, right side: Secondary | ICD-10-CM | POA: Insufficient documentation

## 2020-06-10 LAB — URINALYSIS, COMPLETE (UACMP) WITH MICROSCOPIC
Bacteria, UA: NONE SEEN
Bilirubin Urine: NEGATIVE
Glucose, UA: NEGATIVE mg/dL
Hgb urine dipstick: NEGATIVE
Ketones, ur: NEGATIVE mg/dL
Leukocytes,Ua: NEGATIVE
Nitrite: NEGATIVE
Protein, ur: NEGATIVE mg/dL
Specific Gravity, Urine: 1.021 (ref 1.005–1.030)
pH: 5 (ref 5.0–8.0)

## 2020-06-10 LAB — PREGNANCY, URINE: Preg Test, Ur: NEGATIVE

## 2020-06-10 LAB — POCT PREGNANCY, URINE: Preg Test, Ur: NEGATIVE

## 2020-06-10 MED ORDER — DEXAMETHASONE SODIUM PHOSPHATE 10 MG/ML IJ SOLN
8.0000 mg | Freq: Once | INTRAMUSCULAR | Status: AC
  Administered 2020-06-10: 8 mg via INTRAMUSCULAR
  Filled 2020-06-10: qty 1

## 2020-06-10 MED ORDER — CYCLOBENZAPRINE HCL 5 MG PO TABS: 5.0000 mg | ORAL_TABLET | Freq: Three times a day (TID) | ORAL | 0 refills | Status: AC | PRN

## 2020-06-10 MED ORDER — ORPHENADRINE CITRATE 30 MG/ML IJ SOLN
60.0000 mg | Freq: Once | INTRAMUSCULAR | Status: AC
  Administered 2020-06-10: 60 mg via INTRAMUSCULAR
  Filled 2020-06-10: qty 2

## 2020-06-10 MED ORDER — PREDNISONE 10 MG PO TABS: ORAL_TABLET | ORAL | 0 refills | Status: AC

## 2020-06-10 MED ORDER — GABAPENTIN 800 MG PO TABS: 800.0000 mg | ORAL_TABLET | Freq: Every day | ORAL | 0 refills | Status: DC

## 2020-06-10 MED ORDER — GABAPENTIN 300 MG PO CAPS
300.0000 mg | ORAL_CAPSULE | Freq: Once | ORAL | Status: AC
  Administered 2020-06-10: 300 mg via ORAL
  Filled 2020-06-10: qty 1

## 2020-06-10 MED ORDER — KETOROLAC TROMETHAMINE 30 MG/ML IJ SOLN
30.0000 mg | Freq: Once | INTRAMUSCULAR | Status: AC
  Administered 2020-06-10: 30 mg via INTRAMUSCULAR
  Filled 2020-06-10: qty 1

## 2020-06-10 NOTE — ED Triage Notes (Signed)
Pt reports was cleaning her refrigerator the other day and is not sure how but she strained her back. Pt reports feels a shocking type pain in her right buttocks.

## 2020-06-10 NOTE — ED Provider Notes (Signed)
[facility] Emergency Department Provider Note  ____________________________________________   First MD Initiated Contact with Patient 06/10/20 1643     (approximate)  I have reviewed the triage vital signs and the nursing notes.   HISTORY  Chief Complaint Back Pain  HPI Erin [name] is a 49 y.o. female with past medical history significant for alcohol and polysubstance abuse who reports to the emergency department for evaluation of low back pain. The patient states she has never had anything similar. The pain started on Monday when she was bent over cleaning out her refrigerator. She felt a "twinge" at that time. The pain progressed on Tuesday but she was able to walk and sleep and perform activities of daily living without too much difficulty. On Wednesday and yesterday, the pain has worsened and is now sending what she describes as "shock" like pain radiating into her buttocks and wrapping around the front of her pelvis. Pain is made worse with any motion of the lumbar spine as well as movement particularly of the right leg. The patient was using gabapentin that she previously had the first couple of days but ran out 3 days ago. Pain is rated a 10/10. She denies red flag symptoms of fever, loss of bowel or bladder control or saddle anesthesia.         Past Medical History:  Diagnosis Date  . Anxiety disorder   . Arthritis   . Back pain, chronic   . Bipolar disorder (HCC)   . Cellulitis   . Cervicalgia   . Chicken pox   . Chronic back pain   . COPD (chronic obstructive pulmonary disease) (HCC)   . Depression   . Drug abuse (HCC)   . GERD (gastroesophageal reflux disease)   . Hx of migraines   . Hyperlipidemia   . Insomnia   . Medical history non-contributory   . Migraine   . Nicotine dependence   . Seizure (HCC) unknown  . Seizures (HCC)    pt had 1 seizure October 2012- no other history of seizures  . Suicide attempt (HCC) 01/02/2020  .  Vitamin D deficiency     Patient Active Problem List   Diagnosis Date Noted  . MDD (major depressive disorder) 01/03/2020  . Suicide attempt (HCC) 01/02/2020  . 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 Opiate abuse, continuous (HCC) 04/02/2019  . Substance induced mood disorder (HCC) 04/02/2019  . Bipolar 1 disorder, manic, moderate (HCC) 04/01/2019  . Abdominal pain, chronic, epigastric   . Intractable nausea and vomiting   . Chronic lower back pain 07/24/2017  . Bipolar disorder, current episode mixed, mild (HCC) 07/24/2017  . Anxiety and depression 07/08/2017  . Chronic back pain 07/08/2017  . History of drug abuse (HCC) 07/08/2017  . GERD (gastroesophageal reflux disease) 07/08/2017  . Migraines 07/08/2017  . HLD (hyperlipidemia) 07/08/2017  . Insomnia 07/08/2017  . History of seizure 07/08/2017  . B12 deficiency 07/08/2017  . Degeneration of thoracic intervertebral disc 05/10/2017  . HCV antibody positive 08/12/2015  . Hepatitis C antibody test positive 08/12/2015  . Acute sinusitis with symptoms > 10 days 08/09/2015  . Alcohol abuse, daily use 08/09/2015  . Drug abuse, opioid type (HCC) 08/09/2015  . Elevated vitamin B12 level 08/09/2015  . Tobacco abuse 08/09/2015  . Opiate abuse, episodic (HCC) 08/09/2015  . Nondependent alcohol abuse, continuous drinking behavior 08/09/2015  . Mood disorder (HCC) 08/09/2015  . Bipolar 1 disorder, depressed (HCC) 03/09/2014  . COPD (chronic obstructive pulmonary  disease) (HCC) 03/09/2014  . Dyslipidemia 03/09/2014  . Tobacco use 03/09/2014  . Panic attack 03/09/2014  . Mixed, or nondependent drug abuse 05/11/2004    Past Surgical History:  Procedure Laterality Date  . ABDOMINAL HYSTERECTOMY    . BREAST SURGERY    . CESAREAN SECTION    . CHOLECYSTECTOMY    . ESOPHAGOGASTRODUODENOSCOPY    . ESOPHAGOGASTRODUODENOSCOPY (EGD) WITH PROPOFOL N/A 09/23/2018   Procedure: ESOPHAGOGASTRODUODENOSCOPY (EGD) WITH PROPOFOL;  Surgeon: Toney Reil, MD;  Location: [facility] ENDOSCOPY;  Service: Gastroenterology;  Laterality: N/A;    Prior to Admission medications   Medication Sig Start Date End Date Taking? Authorizing Provider  cyclobenzaprine (FLEXERIL) 5 MG tablet Take 1 tablet (5 mg total) by mouth 3 (three) times daily as needed for up to 5 days for muscle spasms. 06/10/20 06/15/20  Lucy Chris, PA  gabapentin (NEURONTIN) 800 MG tablet Take 1 tablet (800 mg total) by mouth daily. 06/10/20 07/10/20  Lucy Chris, PA  mirtazapine (REMERON) 30 MG tablet Take 1 tablet (30 mg total) by mouth at bedtime. 01/05/20   Clapacs, Jackquline Denmark, MD  predniSONE (DELTASONE) 10 MG tablet Take 4 tablets (40 mg total) by mouth daily with breakfast for 1 day, THEN 3 tablets (30 mg total) daily with breakfast for 1 day, THEN 2 tablets (20 mg total) daily with breakfast for 1 day, THEN 1 tablet (10 mg total) daily with breakfast for 1 day. 06/10/20 06/14/20  Lucy Chris, PA  traZODone (DESYREL) 100 MG tablet Take 1 tablet (100 mg total) by mouth at bedtime as needed for sleep. 01/05/20   Clapacs, Jackquline Denmark, MD    Allergies Clonidine derivatives, Paxil [paroxetine hcl], Simvastatin, and Sulfa drugs cross reactors  Family History  Problem Relation Age of Onset  . Diabetes Mother   . Depression Mother   . Anxiety disorder Mother   . Alcohol abuse Father   . Asthma Father   . COPD Father   . Early death Father   . Bipolar disorder Father   . Heart failure Sister        from chemotherapy   . Arthritis Maternal Grandmother   . Stroke Maternal Grandmother   . Pancreatic cancer Maternal Grandfather   . Anxiety disorder Maternal Grandfather   . Depression Maternal Grandfather   . Bipolar disorder Paternal Uncle   . Bipolar disorder Cousin     Social History Social History   Tobacco Use  . Smoking status: Current Every Day Smoker    Packs/day: 0.50    Years: 25.00    Pack years: 12.50    Types: Cigarettes  . Smokeless tobacco: Never  Used  Vaping Use  . Vaping Use: Never used  Substance Use Topics  . Alcohol use: No    Alcohol/week: 0.0 standard drinks    Comment: none for 3 yrs  . Drug use: Yes    Types: Marijuana, Cocaine, Heroin, Oxycodone    Review of Systems  Constitutional: No fever/chills Eyes: No visual changes. ENT: No sore throat. Cardiovascular: Denies chest pain. Respiratory: Denies shortness of breath. Gastrointestinal: No abdominal pain.  No nausea, no vomiting.  No diarrhea.  No constipation. Genitourinary: Negative for dysuria. Musculoskeletal: + for back pain. Skin: Negative for rash. Neurological: Negative for headaches, focal weakness or numbness.   ____________________________________________   PHYSICAL EXAM:  VITAL SIGNS: ED Triage Vitals  Enc Vitals Group     BP 06/10/20 1349 134/88     Pulse Rate 06/10/20 1349 (!)  103     Resp 06/10/20 1349 20     Temp 06/10/20 1352 98.4 F (36.9 C)     Temp Source 06/10/20 1352 Oral     SpO2 06/10/20 1349 100 %     Weight 06/10/20 1350 103 lb (46.7 kg)     Height 06/10/20 1350 5\' 9"  (1.753 m)     Head Circumference --      Peak Flow --      Pain Score 06/10/20 1350 10     Pain Loc --      Pain Edu? --      Excl. in GC? --     Constitutional: Alert and oriented. Well appearing seated in a flexed position with legs curled to her for comfort. Eyes: Conjunctivae are normal. PERRL. EOMI. Head: Atraumatic. Nose: No congestion/rhinnorhea. Mouth/Throat: Mucous membranes are moist.  Oropharynx non-erythematous. Neck: No stridor.   Cardiovascular: Normal rate, regular rhythm. Grossly normal heart sounds.  Good peripheral circulation. Respiratory: Normal respiratory effort.  No retractions. Lungs CTAB. Gastrointestinal: Soft and nontender. No distention. No CVA tenderness. Musculoskeletal: Palpation over the right SI joint. No tenderness palpation of the midline lumbar spine or paraspinals. Patient has 5 out of 5 strength in both part of the  soft reflexes patellar tendon.  Straight leg raise is negative bilaterally. Neurologic:  Normal speech and language. No gross focal neurologic deficits are appreciated. No gait instability. Skin:  Skin is warm, dry and intact. No rash noted. Psychiatric: Mood and affect are normal. Speech and behavior are normal.  ____________________________________________   LABS (all labs ordered are listed, but only abnormal results are displayed)  Labs Reviewed  URINALYSIS, COMPLETE (UACMP) WITH MICROSCOPIC - Abnormal; Notable for the following components:      Result Value   Color, Urine YELLOW (*)    APPearance HAZY (*)    All other components within normal limits  PREGNANCY, URINE  POCT PREGNANCY, URINE   ____________________________________________  RADIOLOGY   Official radiology report(s): DG Lumbar Spine Complete  Result Date: 06/10/2020 CLINICAL DATA:  Back pain EXAM: LUMBAR SPINE - COMPLETE 4+ VIEW COMPARISON:  MRI 01/08/2012, radiograph 12/19/2011 FINDINGS: Sagittal alignment is normal. Vertebral body heights are maintained. Mild disc space narrowing and osteophytes at L2-L3 with mild disc space narrowing at L5-S1. IMPRESSION: Mild degenerative changes. No acute osseous abnormality. Electronically Signed   By: 12/21/2011 M.D.   On: 06/10/2020 19:11     ____________________________________________   INITIAL IMPRESSION / ASSESSMENT AND PLAN / ED COURSE  As part of my medical decision making, I reviewed the following data within the electronic MEDICAL RECORD NUMBER Nursing notes reviewed and incorporated, Old chart reviewed, Radiograph reviewed of the lumbar spine and Notes from prior ED visits       Shakedra Beam is a 49 year old female who presents to the emergency department for evaluation of lumbar back pain with radiation into the right buttocks and leg.  Patient has an extensive history of polysubstance abuse well-documented throughout her chart.  Patient states pain has been  going on for 4 days worsening since onset.  Physical exam findings suggest tenderness over the SI joint.  There is no red flag symptoms of fever, loss of bowel or bladder or saddle anesthesia.  Straight leg raises negative.  X-rays show no acute bony abnormality and suggest only some age-related arthritic and facet changes.  Initially the patient was treated with a shot of Toradol and gabapentin which she says she takes at home but ran  out of a few days ago.  After evaluation following this medication, patient states that she is none improved.  She was then offered an injection of Decadron as well as Norflex.  Given the reassuring findings on radiographs and physical exam, discussed discharge with the patient with outpatient medications of a short prednisone taper, gabapentin, and Flexeril.  The patient is in agreement with this plan and was advised to return should she develop loss of bowel or bladder or saddle anesthesia.  Deshanti L Farfan was evaluated in Emergency Department on 06/10/2020 for the symptoms described in the history of present illness. She was evaluated in the context of the global COVID-19 pandemic, which necessitated consideration that the patient might be at risk for infection with the SARS-CoV-2 virus that causes COVID-19. Institutional protocols and algorithms that pertain to the evaluation of patients at risk for COVID-19 are in a state of rapid change based on information released by regulatory bodies including the CDC and federal and state organizations. These policies and algorithms were followed during the patient's care in the ED.      ____________________________________________   FINAL CLINICAL IMPRESSION(S) / ED DIAGNOSES  Final diagnoses:  Sciatica of right side     ED Discharge Orders         Ordered    gabapentin (NEURONTIN) 800 MG tablet  Daily        06/10/20 2006    predniSONE (DELTASONE) 10 MG tablet        06/10/20 2006    cyclobenzaprine (FLEXERIL) 5 MG  tablet  3 times daily PRN        06/10/20 2006           Note:  This document was prepared using Dragon voice recognition software and may include unintentional dictation errors.    Lucy Chris, PA 06/10/20 2032    Sharman Cheek, MD 06/10/20 2059

## 2020-08-22 ENCOUNTER — Emergency Department
Admission: EM | Admit: 2020-08-22 | Discharge: 2020-08-22 | Disposition: A | Discharge status: Home / Self Care | Payer: Self-pay | Attending: Emergency Medicine | Admitting: Emergency Medicine

## 2020-08-22 ENCOUNTER — Encounter: Payer: Self-pay | Admitting: *Deleted

## 2020-08-22 ENCOUNTER — Other Ambulatory Visit: Payer: Self-pay

## 2020-08-22 DIAGNOSIS — F1910 Other psychoactive substance abuse, uncomplicated: Secondary | ICD-10-CM | POA: Insufficient documentation

## 2020-08-22 DIAGNOSIS — G8929 Other chronic pain: Secondary | ICD-10-CM | POA: Insufficient documentation

## 2020-08-22 DIAGNOSIS — M545 Low back pain, unspecified: Secondary | ICD-10-CM | POA: Insufficient documentation

## 2020-08-22 DIAGNOSIS — F17210 Nicotine dependence, cigarettes, uncomplicated: Secondary | ICD-10-CM | POA: Insufficient documentation

## 2020-08-22 DIAGNOSIS — J449 Chronic obstructive pulmonary disease, unspecified: Secondary | ICD-10-CM | POA: Insufficient documentation

## 2020-08-22 LAB — URINALYSIS, COMPLETE (UACMP) WITH MICROSCOPIC
Bacteria, UA: NONE SEEN
Bilirubin Urine: NEGATIVE
Glucose, UA: NEGATIVE mg/dL
Hgb urine dipstick: NEGATIVE
Ketones, ur: NEGATIVE mg/dL
Leukocytes,Ua: NEGATIVE
Nitrite: NEGATIVE
Protein, ur: NEGATIVE mg/dL
Specific Gravity, Urine: 1.023 (ref 1.005–1.030)
pH: 5 (ref 5.0–8.0)

## 2020-08-22 LAB — URINE DRUG SCREEN, QUALITATIVE (ARMC ONLY)
Amphetamines, Ur Screen: POSITIVE — AB
Barbiturates, Ur Screen: NOT DETECTED
Benzodiazepine, Ur Scrn: POSITIVE — AB
Cannabinoid 50 Ng, Ur ~~LOC~~: NOT DETECTED
Cocaine Metabolite,Ur ~~LOC~~: NOT DETECTED
MDMA (Ecstasy)Ur Screen: NOT DETECTED
Methadone Scn, Ur: POSITIVE — AB
Opiate, Ur Screen: NOT DETECTED
Phencyclidine (PCP) Ur S: NOT DETECTED
Tricyclic, Ur Screen: NOT DETECTED

## 2020-08-22 NOTE — ED Notes (Signed)
See triage note, pt states having back pain for two weeks on right side with worsening pain recently.

## 2020-08-22 NOTE — ED Triage Notes (Signed)
Pt to ED reporting right flank and back pain x 2 weeks. Dysuria and increased pain with movement. Pt concerns for UTI and reports she has been taking Azo without relief. No fevers.   Pt Is unable to sit still in triage.

## 2020-08-22 NOTE — ED Provider Notes (Signed)
[facility] Emergency Department Provider Note ____________________________________________   First MD Initiated Contact with Patient 08/22/20 1931     (approximate)  I have reviewed the triage vital signs and the nursing notes.   HISTORY  Chief Complaint Flank Pain  HPI Erin [name] is a 49 y.o. female with history of polysubstance abuse, chronic back pain, and other history as listed below presents to the emergency department for treatment and evaluation of lower back pain, worse on right. Pain is like a stretching feeling and is intermittent. No relief with tylenol, azo, and ibuprofen. Pain 8/10. Denies fever or chills. No trauma. Dysuria with urination. She does have chronic back pain for which she takes ibuprofen and gabapentin.       Past Medical History:  Diagnosis Date  . Anxiety disorder   . Arthritis   . Back pain, chronic   . Bipolar disorder (HCC)   . Cellulitis   . Cervicalgia   . Chicken pox   . Chronic back pain   . COPD (chronic obstructive pulmonary disease) (HCC)   . Depression   . Drug abuse (HCC)   . GERD (gastroesophageal reflux disease)   . Hx of migraines   . Hyperlipidemia   . Insomnia   . Medical history non-contributory   . Migraine   . Nicotine dependence   . Seizure (HCC) unknown  . Seizures (HCC)    pt had 1 seizure October 2012- no other history of seizures  . Suicide attempt (HCC) 01/02/2020  . Vitamin D deficiency     Patient Active Problem List   Diagnosis Date Noted  . MDD (major depressive disorder) 01/03/2020  . Suicide attempt (HCC) 01/02/2020  . 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 Opiate abuse, continuous (HCC) 04/02/2019  . Substance induced mood disorder (HCC) 04/02/2019  . Bipolar 1 disorder, manic, moderate (HCC) 04/01/2019  . Abdominal pain, chronic, epigastric   . Intractable nausea and vomiting   . Chronic lower back pain 07/24/2017  . Bipolar disorder, current episode mixed, mild (HCC)  07/24/2017  . Anxiety and depression 07/08/2017  . Chronic back pain 07/08/2017  . History of drug abuse (HCC) 07/08/2017  . GERD (gastroesophageal reflux disease) 07/08/2017  . Migraines 07/08/2017  . HLD (hyperlipidemia) 07/08/2017  . Insomnia 07/08/2017  . History of seizure 07/08/2017  . B12 deficiency 07/08/2017  . Degeneration of thoracic intervertebral disc 05/10/2017  . HCV antibody positive 08/12/2015  . Hepatitis C antibody test positive 08/12/2015  . Acute sinusitis with symptoms > 10 days 08/09/2015  . Alcohol abuse, daily use 08/09/2015  . Drug abuse, opioid type (HCC) 08/09/2015  . Elevated vitamin B12 level 08/09/2015  . Tobacco abuse 08/09/2015  . Opiate abuse, episodic (HCC) 08/09/2015  . Nondependent alcohol abuse, continuous drinking behavior 08/09/2015  . Mood disorder (HCC) 08/09/2015  . Bipolar 1 disorder, depressed (HCC) 03/09/2014  . COPD (chronic obstructive pulmonary disease) (HCC) 03/09/2014  . Dyslipidemia 03/09/2014  . Tobacco use 03/09/2014  . Panic attack 03/09/2014  . Mixed, or nondependent drug abuse 05/11/2004    Past Surgical History:  Procedure Laterality Date  . ABDOMINAL HYSTERECTOMY    . BREAST SURGERY    . CESAREAN SECTION    . CHOLECYSTECTOMY    . ESOPHAGOGASTRODUODENOSCOPY    . ESOPHAGOGASTRODUODENOSCOPY (EGD) WITH PROPOFOL N/A 09/23/2018   Procedure: ESOPHAGOGASTRODUODENOSCOPY (EGD) WITH PROPOFOL;  Surgeon: Toney Reil, MD;  Location: [facility] ENDOSCOPY;  Service: Gastroenterology;  Laterality: N/A;    Prior to Admission  medications   Medication Sig Start Date End Date Taking? Authorizing Provider  gabapentin (NEURONTIN) 800 MG tablet Take 1 tablet (800 mg total) by mouth daily. 06/10/20 07/10/20  Lucy Chris, PA  mirtazapine (REMERON) 30 MG tablet Take 1 tablet (30 mg total) by mouth at bedtime. 01/05/20   Clapacs, Jackquline Denmark, MD  traZODone (DESYREL) 100 MG tablet Take 1 tablet (100 mg total) by mouth at bedtime as needed for  sleep. 01/05/20   Clapacs, Jackquline Denmark, MD    Allergies Clonidine derivatives, Paxil [paroxetine hcl], Simvastatin, and Sulfa drugs cross reactors  Family History  Problem Relation Age of Onset  . Diabetes Mother   . Depression Mother   . Anxiety disorder Mother   . Alcohol abuse Father   . Asthma Father   . COPD Father   . Early death Father   . Bipolar disorder Father   . Heart failure Sister        from chemotherapy   . Arthritis Maternal Grandmother   . Stroke Maternal Grandmother   . Pancreatic cancer Maternal Grandfather   . Anxiety disorder Maternal Grandfather   . Depression Maternal Grandfather   . Bipolar disorder Paternal Uncle   . Bipolar disorder Cousin     Social History Social History   Tobacco Use  . Smoking status: Current Every Day Smoker    Packs/day: 0.50    Years: 25.00    Pack years: 12.50    Types: Cigarettes  . Smokeless tobacco: Never Used  Vaping Use  . Vaping Use: Never used  Substance Use Topics  . Alcohol use: No    Alcohol/week: 0.0 standard drinks    Comment: none for 3 yrs  . Drug use: Yes    Types: Marijuana, Cocaine, Heroin, Oxycodone    Review of Systems  Constitutional: No fever/chills, falls asleep mid conversation Eyes: No visual changes. ENT: No sore throat. Cardiovascular: Denies chest pain. Respiratory: Denies shortness of breath. Gastrointestinal: No abdominal pain.  No nausea, no vomiting.  No diarrhea.  No constipation. Genitourinary: Negative for dysuria. Musculoskeletal: Negative for back pain. Skin: Negative for rash. Neurological: Negative for headaches, focal weakness or numbness. Psychological: Restless ____________________________________________   PHYSICAL EXAM:  VITAL SIGNS: ED Triage Vitals  Enc Vitals Group     BP 08/22/20 1908 (!) 132/92     Pulse Rate 08/22/20 1908 64     Resp 08/22/20 1908 16     Temp 08/22/20 1908 97.7 F (36.5 C)     Temp Source 08/22/20 1908 Oral     SpO2 08/22/20 1908 97  %     Weight 08/22/20 1909 100 lb (45.4 kg)     Height 08/22/20 1909 5\' 9"  (1.753 m)     Head Circumference --      Peak Flow --      Pain Score 08/22/20 1909 8     Pain Loc --      Pain Edu? --      Excl. in GC? --     Constitutional: Emaciated appearing and in no acute distress. Eyes: Conjunctivae are normal. Head: Atraumatic. Nose: No congestion/rhinnorhea. Mouth/Throat: Mucous membranes are moist.  Oropharynx non-erythematous. Neck: No stridor.   Hematological/Lymphatic/Immunilogical: No cervical lymphadenopathy. Cardiovascular: Normal rate, regular rhythm. Grossly normal heart sounds.  Good peripheral circulation. Respiratory: Normal respiratory effort.  No retractions. Lungs CTAB. Gastrointestinal: Soft and nontender. No distention. No abdominal bruits. No CVA tenderness. Genitourinary:  Musculoskeletal:  Neurologic:  Somnolent but will awaken to voice  and answers questions appropriately. Skin:  Skin is warm, dry and intact. No rash noted. Psychiatric: Somnolent. Denies SI or HI  ____________________________________________   LABS (all labs ordered are listed, but only abnormal results are displayed)  Labs Reviewed  URINALYSIS, COMPLETE (UACMP) WITH MICROSCOPIC - Abnormal; Notable for the following components:      Result Value   Color, Urine YELLOW (*)    APPearance HAZY (*)    All other components within normal limits  URINE DRUG SCREEN, QUALITATIVE ([facility] ONLY) - Abnormal; Notable for the following components:   Amphetamines, Ur Screen POSITIVE (*)    Benzodiazepine, Ur Scrn POSITIVE (*)    Methadone Scn, Ur POSITIVE (*)    All other components within normal limits   ____________________________________________  EKG  Not indicated. ____________________________________________  RADIOLOGY  ED MD interpretation:    Not indicated.  I, Kem Boroughs, personally viewed and evaluated these images (plain radiographs) as part of my medical decision making, as  well as reviewing the written report by the radiologist.  Official radiology report(s): No results found.  ____________________________________________   PROCEDURES  Procedure(s) performed (including Critical Care):  Procedures  ____________________________________________   INITIAL IMPRESSION / ASSESSMENT AND PLAN     49 year old female presenting to the emergency department for treatment and evaluation of low back pain.  She denies injury.  She reportedly has taken Tylenol and, ibuprofen, and Azo without relief.  She is excessively sleepy and falls asleep while answering questions.  She does have a significant past medical history of polysubstance abuse and her somnolence is felt to be most likely related.  Plan will be to get a urinalysis to rule out an acute cystitis but also collect urine for drug screen to correlate.  Patient denies drug overdose.  She also denies suicidal or homicidal ideations at this time.   ED COURSE  Urine is not consistent with an acute cystitis or pyelonephritis.  She has no CVA tenderness on exam.  This is likely acute on chronic back pain.  Her urine drug screen does show benzodiazepines, amphetamines, and methadone.  She will be encouraged to take Tylenol or ibuprofen if needed for her pain.  No prescriptions will be written tonight.  She will be discharged home when she has a responsible person who does not appear under the influence.  She was encouraged to see primary care for symptoms of concern.  She was also given a information for [facility] and encouraged to seek rehabilitation for substance abuse.  __________________________________________   FINAL CLINICAL IMPRESSION(S) / ED DIAGNOSES  Final diagnoses:  Chronic bilateral low back pain without sciatica  Substance abuse [facility])     ED Discharge Orders    None       Erin [name] was evaluated in Emergency Department on 08/22/2020 for the symptoms described in the history of present illness.  She was evaluated in the context of the global COVID-19 pandemic, which necessitated consideration that the patient might be at risk for infection with the SARS-CoV-2 virus that causes COVID-19. Institutional protocols and algorithms that pertain to the evaluation of patients at risk for COVID-19 are in a state of rapid change based on information released by regulatory bodies including the CDC and federal and state organizations. These policies and algorithms were followed during the patient's care in the ED.   Note:  This document was prepared using Dragon voice recognition software and may include unintentional dictation errors.   Chinita Pester, FNP 08/22/20 2254  Phineas SemenGoodman, [place], MD 08/23/20 [phone]

## 2020-08-22 NOTE — ED Notes (Signed)
Pt mother contacted by this RN to pick up patient. Pt family to find 'someone to pick her up'.

## 2020-08-22 NOTE — Discharge Instructions (Addendum)
Take Aleve for pain.  Follow up with [facility] to assist with substance abuse. Also, follow up with primary care if back pain continues despite taking Aleve.

## 2021-02-18 ENCOUNTER — Other Ambulatory Visit: Payer: Self-pay

## 2021-02-18 ENCOUNTER — Emergency Department
Admission: EM | Admit: 2021-02-18 | Discharge: 2021-02-18 | Disposition: A | Discharge status: Home / Self Care | Payer: Self-pay | Attending: Emergency Medicine | Admitting: Emergency Medicine

## 2021-02-18 DIAGNOSIS — F17210 Nicotine dependence, cigarettes, uncomplicated: Secondary | ICD-10-CM | POA: Insufficient documentation

## 2021-02-18 DIAGNOSIS — J449 Chronic obstructive pulmonary disease, unspecified: Secondary | ICD-10-CM | POA: Insufficient documentation

## 2021-02-18 DIAGNOSIS — L0291 Cutaneous abscess, unspecified: Secondary | ICD-10-CM

## 2021-02-18 DIAGNOSIS — L02413 Cutaneous abscess of right upper limb: Secondary | ICD-10-CM | POA: Insufficient documentation

## 2021-02-18 MED ORDER — CEPHALEXIN 500 MG PO CAPS
500.0000 mg | ORAL_CAPSULE | Freq: Once | ORAL | Status: AC
  Administered 2021-02-18: 500 mg via ORAL
  Filled 2021-02-18: qty 1

## 2021-02-18 MED ORDER — KETOROLAC TROMETHAMINE 60 MG/2ML IM SOLN: 60.0000 mg | Freq: Once | INTRAMUSCULAR | Status: DC

## 2021-02-18 MED ORDER — KETOROLAC TROMETHAMINE 60 MG/2ML IM SOLN
30.0000 mg | Freq: Once | INTRAMUSCULAR | Status: AC
  Administered 2021-02-18: 30 mg via INTRAMUSCULAR
  Filled 2021-02-18: qty 2

## 2021-02-18 MED ORDER — LIDOCAINE HCL (PF) 1 % IJ SOLN
5.0000 mL | Freq: Once | INTRAMUSCULAR | Status: AC
  Administered 2021-02-18: 5 mL
  Filled 2021-02-18: qty 5

## 2021-02-18 MED ORDER — CEPHALEXIN 500 MG PO CAPS: 500.0000 mg | ORAL_CAPSULE | Freq: Four times a day (QID) | ORAL | 0 refills | Status: AC

## 2021-02-18 NOTE — ED Notes (Signed)
Patient given IM pain medication. Patient's states she does not want labs drawn ATT. EDP informed.

## 2021-02-18 NOTE — ED Triage Notes (Signed)
Pt called for triage, no response. RN to BR to look for pt and she is brushing her teeth in the sink. Will let Rn know when done

## 2021-02-18 NOTE — Discharge Instructions (Signed)
Please seek medical attention for any high fevers, chest pain, shortness of breath, change in behavior, persistent vomiting, bloody stool or any other new or concerning symptoms.  

## 2021-02-18 NOTE — ED Triage Notes (Signed)
Called pt to do a lab draw pt did not answer to call. She did tell this RN she was going to tell her ride to go home, but has not returned. Security does have her pocket knife.

## 2021-02-18 NOTE — ED Notes (Signed)
Pt returned to ED desk stating she was outside although ED staff was not able to locate her. Name replaced in system at this time.

## 2021-02-18 NOTE — ED Provider Notes (Signed)
[facility] Emergency Department Provider Note  ____________________________________________   I have reviewed the triage vital signs and the nursing notes.   HISTORY  Chief Complaint Abscess   History limited by: Not Limited   HPI Erin [name] is a 50 y.o. female who presents to the emergency department today because of concern for an abscess to her right arm. The patient states that 8 days ago she did try using IV drugs to that area. 3 days ago she started noticing swelling redness and pain to that area. It has been increasing over the past three days. While waiting to come back to a room here in the emergency department she states that it started draining. She does feel like she has had fever in her body. The patient states that she was using a new needle.    Records reviewed. Per medical record review patient has a history of substance abuse.  Past Medical History:  Diagnosis Date  . Anxiety disorder   . Arthritis   . Back pain, chronic   . Bipolar disorder (HCC)   . Cellulitis   . Cervicalgia   . Chicken pox   . Chronic back pain   . COPD (chronic obstructive pulmonary disease) (HCC)   . Depression   . Drug abuse (HCC)   . GERD (gastroesophageal reflux disease)   . Hx of migraines   . Hyperlipidemia   . Insomnia   . Medical history non-contributory   . Migraine   . Nicotine dependence   . Seizure (HCC) unknown  . Seizures (HCC)    pt had 1 seizure October 2012- no other history of seizures  . Suicide attempt (HCC) 01/02/2020  . Vitamin D deficiency     Patient Active Problem List   Diagnosis Date Noted  . MDD (major depressive disorder) 01/03/2020  . Suicide attempt (HCC) 01/02/2020  . 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 Opiate abuse, continuous (HCC) 04/02/2019  . Substance induced mood disorder (HCC) 04/02/2019  . Bipolar 1 disorder, manic, moderate (HCC) 04/01/2019  . Abdominal pain, chronic, epigastric   . Intractable nausea  and vomiting   . Chronic lower back pain 07/24/2017  . Bipolar disorder, current episode mixed, mild (HCC) 07/24/2017  . Anxiety and depression 07/08/2017  . Chronic back pain 07/08/2017  . History of drug abuse (HCC) 07/08/2017  . GERD (gastroesophageal reflux disease) 07/08/2017  . Migraines 07/08/2017  . HLD (hyperlipidemia) 07/08/2017  . Insomnia 07/08/2017  . History of seizure 07/08/2017  . B12 deficiency 07/08/2017  . Degeneration of thoracic intervertebral disc 05/10/2017  . HCV antibody positive 08/12/2015  . Hepatitis C antibody test positive 08/12/2015  . Acute sinusitis with symptoms > 10 days 08/09/2015  . Alcohol abuse, daily use 08/09/2015  . Drug abuse, opioid type (HCC) 08/09/2015  . Elevated vitamin B12 level 08/09/2015  . Tobacco abuse 08/09/2015  . Opiate abuse, episodic (HCC) 08/09/2015  . Nondependent alcohol abuse, continuous drinking behavior 08/09/2015  . Mood disorder (HCC) 08/09/2015  . Bipolar 1 disorder, depressed (HCC) 03/09/2014  . COPD (chronic obstructive pulmonary disease) (HCC) 03/09/2014  . Dyslipidemia 03/09/2014  . Tobacco use 03/09/2014  . Panic attack 03/09/2014  . Mixed, or nondependent drug abuse 05/11/2004    Past Surgical History:  Procedure Laterality Date  . ABDOMINAL HYSTERECTOMY    . BREAST SURGERY    . CESAREAN SECTION    . CHOLECYSTECTOMY    . ESOPHAGOGASTRODUODENOSCOPY    . ESOPHAGOGASTRODUODENOSCOPY (EGD) WITH PROPOFOL N/A 09/23/2018  Procedure: ESOPHAGOGASTRODUODENOSCOPY (EGD) WITH PROPOFOL;  Surgeon: Toney Reil, MD;  Location: [facility] ENDOSCOPY;  Service: Gastroenterology;  Laterality: N/A;    Prior to Admission medications   Medication Sig Start Date End Date Taking? Authorizing Provider  gabapentin (NEURONTIN) 800 MG tablet Take 1 tablet (800 mg total) by mouth daily. 06/10/20 07/10/20  Lucy Chris, PA  mirtazapine (REMERON) 30 MG tablet Take 1 tablet (30 mg total) by mouth at bedtime. 01/05/20   Clapacs,  Jackquline Denmark, MD  traZODone (DESYREL) 100 MG tablet Take 1 tablet (100 mg total) by mouth at bedtime as needed for sleep. 01/05/20   Clapacs, Jackquline Denmark, MD    Allergies Clonidine derivatives, Paxil [paroxetine hcl], Simvastatin, and Sulfa drugs cross reactors  Family History  Problem Relation Age of Onset  . Diabetes Mother   . Depression Mother   . Anxiety disorder Mother   . Alcohol abuse Father   . Asthma Father   . COPD Father   . Early death Father   . Bipolar disorder Father   . Heart failure Sister        from chemotherapy   . Arthritis Maternal Grandmother   . Stroke Maternal Grandmother   . Pancreatic cancer Maternal Grandfather   . Anxiety disorder Maternal Grandfather   . Depression Maternal Grandfather   . Bipolar disorder Paternal Uncle   . Bipolar disorder Cousin     Social History Social History   Tobacco Use  . Smoking status: Current Every Day Smoker    Packs/day: 0.50    Years: 25.00    Pack years: 12.50    Types: Cigarettes  . Smokeless tobacco: Never Used  Vaping Use  . Vaping Use: Never used  Substance Use Topics  . Alcohol use: No    Alcohol/week: 0.0 standard drinks    Comment: none for 3 yrs  . Drug use: Yes    Types: Marijuana, Cocaine, Heroin, Oxycodone    Review of Systems Constitutional: No fever/chills Eyes: No visual changes. ENT: No sore throat. Cardiovascular: Denies chest pain. Respiratory: Denies shortness of breath. Gastrointestinal: No abdominal pain.  No nausea, no vomiting.  No diarrhea.   Genitourinary: Negative for dysuria. Musculoskeletal: Positive for pain and swelling to the right forearm.  Skin: Positive for erythema to right forearm.  Neurological: Negative for headaches, focal weakness or numbness.  ____________________________________________   PHYSICAL EXAM:  VITAL SIGNS: ED Triage Vitals  Enc Vitals Group     BP 02/18/21 1902 112/86     Pulse Rate 02/18/21 1902 (!) 126     Resp 02/18/21 1902 18     Temp  02/18/21 1902 98.6 F (37 C)     Temp Source 02/18/21 1902 Oral     SpO2 02/18/21 1902 97 %     Weight 02/18/21 1905 110 lb (49.9 kg)     Height 02/18/21 1905 5\' 7"  (1.702 m)     Head Circumference --      Peak Flow --      Pain Score 02/18/21 1906 10   Constitutional: Alert and oriented.  Eyes: Conjunctivae are normal.  ENT      Head: Normocephalic and atraumatic.      Nose: No congestion/rhinnorhea.      Mouth/Throat: Mucous membranes are moist.      Neck: No stridor. Hematological/Lymphatic/Immunilogical: No cervical lymphadenopathy. Cardiovascular: Normal rate, regular rhythm.  No murmurs, rubs, or gallops.  Respiratory: Normal respiratory effort without tachypnea nor retractions. Breath sounds are clear and equal  bilaterally. No wheezes/rales/rhonchi. Gastrointestinal: Soft and non tender. No rebound. No guarding.  Genitourinary: Deferred Musculoskeletal: swelling and pain to right forearm.  Neurologic:  Normal speech and language. No gross focal neurologic deficits are appreciated.  Skin:  Abscess noted to right forearm, actively draining pus.  Psychiatric: Mood and affect are normal. Speech and behavior are normal. Patient exhibits appropriate insight and judgment.  ____________________________________________    LABS (pertinent positives/negatives)  None  ____________________________________________   EKG  None  ____________________________________________    RADIOLOGY  None  ____________________________________________   PROCEDURES  Procedures  Incision and Drainage of Abscess Location: right forearm Anesthesia Local: 1% Lidocaine with Epi  Prep/Procedure: Skin Prep: Alcohol Incised abscess with #11 blade Purulent discharge: large Probed to break up loculations Estimated blood loss: 2 ml  ____________________________________________   INITIAL IMPRESSION / ASSESSMENT AND PLAN / ED COURSE  Pertinent labs & imaging results that were  available during my care of the patient were reviewed by me and considered in my medical decision making (see chart for details).   Patient presented to the emergency department today because of concerns for abscess to her right forearm.  While it had started spontaneously draining prior to my evaluation I did think patient would benefit from I&D.  Patient was willing to undergo I&D and large amount of purulent discharge was expressed.  Discussed care with patient.  Will start antibiotics.  ___________________________________________   FINAL CLINICAL IMPRESSION(S) / ED DIAGNOSES  Final diagnoses:  Abscess     Note: This dictation was prepared with Dragon dictation. Any transcriptional errors that result from this process are unintentional     Phineas Semen, MD 02/18/21 [phone]

## 2021-02-18 NOTE — ED Triage Notes (Signed)
Pt c/o R forearm abscess x 8 days. Pt sts she does not normally use IV drugs but 8 days ago attempted to inject IV.  Pt noted have swelling to the RFA and R hand with erythema. Pt reports nausea today and denies fever. Pt reports taking Aleve at 1700.

## 2023-05-21 ENCOUNTER — Ambulatory Visit (LOCAL_COMMUNITY_HEALTH_CENTER): Payer: Self-pay

## 2023-05-21 DIAGNOSIS — Z111 Encounter for screening for respiratory tuberculosis: Secondary | ICD-10-CM

## 2023-05-24 ENCOUNTER — Ambulatory Visit (LOCAL_COMMUNITY_HEALTH_CENTER): Payer: Self-pay

## 2023-05-24 ENCOUNTER — Telehealth: Payer: Self-pay | Admitting: Family Medicine

## 2023-05-24 DIAGNOSIS — Z111 Encounter for screening for respiratory tuberculosis: Secondary | ICD-10-CM

## 2023-05-24 NOTE — Telephone Encounter (Signed)
Phone call to patient and she states she is on her way to [facility] for tb test reading. RN gave her instructions to check in at info booth when arrives. Questions answered and reports understanding. Jerel Shepherd, RN

## 2023-05-24 NOTE — Telephone Encounter (Signed)
Please give me a call back had question about tb test

## 2023-07-23 ENCOUNTER — Other Ambulatory Visit: Payer: Self-pay

## 2023-07-23 ENCOUNTER — Emergency Department
Admission: EM | Admit: 2023-07-23 | Discharge: 2023-07-23 | Disposition: A | Discharge status: Home / Self Care | Payer: Self-pay | Attending: Emergency Medicine | Admitting: Emergency Medicine

## 2023-07-23 DIAGNOSIS — N390 Urinary tract infection, site not specified: Secondary | ICD-10-CM | POA: Insufficient documentation

## 2023-07-23 DIAGNOSIS — F1510 Other stimulant abuse, uncomplicated: Secondary | ICD-10-CM | POA: Insufficient documentation

## 2023-07-23 DIAGNOSIS — J449 Chronic obstructive pulmonary disease, unspecified: Secondary | ICD-10-CM | POA: Insufficient documentation

## 2023-07-23 DIAGNOSIS — F1910 Other psychoactive substance abuse, uncomplicated: Secondary | ICD-10-CM

## 2023-07-23 DIAGNOSIS — F1410 Cocaine abuse, uncomplicated: Secondary | ICD-10-CM | POA: Insufficient documentation

## 2023-07-23 DIAGNOSIS — F1010 Alcohol abuse, uncomplicated: Secondary | ICD-10-CM | POA: Insufficient documentation

## 2023-07-23 LAB — URINALYSIS, ROUTINE W REFLEX MICROSCOPIC
Bilirubin Urine: NEGATIVE
Glucose, UA: NEGATIVE mg/dL
Ketones, ur: NEGATIVE mg/dL
Nitrite: NEGATIVE
Protein, ur: NEGATIVE mg/dL
Specific Gravity, Urine: 1.011 (ref 1.005–1.030)
WBC, UA: >50 WBC/hpf (ref 0–5)
pH: 6 (ref 5.0–8.0)

## 2023-07-23 MED ORDER — CEPHALEXIN 500 MG PO CAPS: 500.0000 mg | ORAL_CAPSULE | Freq: Two times a day (BID) | ORAL | 0 refills | Status: DC

## 2023-07-23 MED ORDER — ONDANSETRON 4 MG PO TBDP: 4.0000 mg | ORAL_TABLET | Freq: Three times a day (TID) | ORAL | Status: DC | PRN

## 2023-07-23 MED ORDER — CEPHALEXIN 500 MG PO CAPS
500.0000 mg | ORAL_CAPSULE | Freq: Once | ORAL | Status: AC
  Administered 2023-07-23: 500 mg via ORAL
  Filled 2023-07-23: qty 1

## 2023-07-23 NOTE — ED Provider Notes (Signed)
[facility] Provider Note    Event Date/Time   First MD Initiated Contact with Patient 07/23/23 [phone]     (approximate)   History   Drug Problem   HPI  Erin [name] is a 52 y.o. female with history of polysubstance abuse, COPD, hyperlipidemia who presents to the emergency department requesting a safe place to stay tonight.  States she is withdrawing from methamphetamine, cocaine, Xanax, alcohol.  She is going to [facility] at 9:15 in the morning.  She did not feel like she was safe at home tonight.  She denies SI or HI.  She is resting comfortably currently.  Patient is complaining of feeling restless.  She is having nausea and dysuria.  No vomiting.   History provided by patient .    Past Medical History:  Diagnosis Date   Anxiety disorder    Arthritis    Back pain, chronic    Bipolar disorder (HCC)    Cellulitis    Cervicalgia    Chicken pox    Chronic back pain    COPD (chronic obstructive pulmonary disease) (HCC)    Depression    Drug abuse (HCC)    GERD (gastroesophageal reflux disease)    Hx of migraines    Hyperlipidemia    Insomnia    Medical history non-contributory    Migraine    Nicotine dependence    Seizure (HCC) unknown   Seizures (HCC)    pt had 1 seizure October 2012- no other history of seizures   Suicide attempt (HCC) 01/02/2020   Vitamin D deficiency     Past Surgical History:  Procedure Laterality Date   ABDOMINAL HYSTERECTOMY     BREAST SURGERY     CESAREAN SECTION     CHOLECYSTECTOMY     ESOPHAGOGASTRODUODENOSCOPY     ESOPHAGOGASTRODUODENOSCOPY (EGD) WITH PROPOFOL N/A 09/23/2018   Procedure: ESOPHAGOGASTRODUODENOSCOPY (EGD) WITH PROPOFOL;  Surgeon: Toney Reil, MD;  Location: [facility] ENDOSCOPY;  Service: Gastroenterology;  Laterality: N/A;    MEDICATIONS:  Prior to Admission medications   Medication Sig Start Date End Date Taking? Authorizing Provider  gabapentin (NEURONTIN) 800 MG tablet Take 1  tablet (800 mg total) by mouth daily. 06/10/20 07/10/20  Lucy Chris, PA  mirtazapine (REMERON) 30 MG tablet Take 1 tablet (30 mg total) by mouth at bedtime. 01/05/20   Clapacs, Jackquline Denmark, MD  traZODone (DESYREL) 100 MG tablet Take 1 tablet (100 mg total) by mouth at bedtime as needed for sleep. 01/05/20   Clapacs, Jackquline Denmark, MD    Physical Exam   Triage Vital Signs: ED Triage Vitals  Encounter Vitals Group     BP 07/23/23 0010 (!) 142/102     Systolic BP Percentile --      Diastolic BP Percentile --      Pulse Rate 07/23/23 0010 (!) 120     Resp 07/23/23 0010 18     Temp 07/23/23 0010 98.2 F (36.8 C)     Temp Source 07/23/23 0010 Oral     SpO2 07/23/23 0010 100 %     Weight 07/23/23 0009 120 lb (54.4 kg)     Height 07/23/23 0009 5\' 9"  (1.753 m)     Head Circumference --      Peak Flow --      Pain Score 07/23/23 0009 0     Pain Loc --      Pain Education --      Exclude from Growth Chart --  Most recent vital signs: Vitals:   07/23/23 0010 07/23/23 0155  BP: (!) 142/102 116/71  Pulse: (!) 120 84  Resp: 18 18  Temp: 98.2 F (36.8 C) 98.6 F (37 C)  SpO2: 100% 99%    CONSTITUTIONAL: Alert, responds appropriately to questions. Well-appearing; well-nourished HEAD: Normocephalic, atraumatic EYES: Conjunctivae clear, pupils appear equal, sclera nonicteric ENT: normal nose; moist mucous membranes NECK: Supple, normal ROM CARD: Regular and tachycardic; S1 and S2 appreciated RESP: Normal chest excursion without splinting or tachypnea; breath sounds clear and equal bilaterally; no wheezes, no rhonchi, no rales, no hypoxia or respiratory distress, speaking full sentences ABD/GI: Non-distended; soft, non-tender, no rebound, no guarding, no peritoneal signs BACK: The back appears normal EXT: Normal ROM in all joints; no deformity noted, no edema SKIN: Normal color for age and race; warm; no rash on exposed skin NEURO: Moves all extremities equally, normal speech PSYCH: The  patient's mood and manner are appropriate.  No SI or HI.   ED Results / Procedures / Treatments   LABS: (all labs ordered are listed, but only abnormal results are displayed) Labs Reviewed  URINALYSIS, ROUTINE W REFLEX MICROSCOPIC - Abnormal; Notable for the following components:      Result Value   Color, Urine YELLOW (*)    APPearance HAZY (*)    Hgb urine dipstick SMALL (*)    Leukocytes,Ua MODERATE (*)    Bacteria, UA RARE (*)    All other components within normal limits  URINE CULTURE     EKG:  EKG Interpretation Date/Time:    Ventricular Rate:    PR Interval:    QRS Duration:    QT Interval:    QTC Calculation:   R Axis:      Text Interpretation:           RADIOLOGY: My personal review and interpretation of imaging:    I have personally reviewed all radiology reports.   No results found.   PROCEDURES:  Critical Care performed: No   CRITICAL CARE Performed by: Rochele Raring   Total critical care time: 0 minutes  Critical care time was exclusive of separately billable procedures and treating other patients.  Critical care was necessary to treat or prevent imminent or life-threatening deterioration.  Critical care was time spent personally by me on the following activities: development of treatment plan with patient and/or surrogate as well as nursing, discussions with consultants, evaluation of patient's response to treatment, examination of patient, obtaining history from patient or surrogate, ordering and performing treatments and interventions, ordering and review of laboratory studies, ordering and review of radiographic studies, pulse oximetry and re-evaluation of patient's condition.   Procedures    IMPRESSION / MDM / ASSESSMENT AND PLAN / ED COURSE  I reviewed the triage vital signs and the nursing notes.    Patient here with polysubstance abuse, dysuria and nausea.  The patient is on the cardiac monitor to evaluate for evidence of  arrhythmia and/or significant heart rate changes.   DIFFERENTIAL DIAGNOSIS (includes but not limited to):   Substance abuse, withdrawal, no psychiatric safety concern, UTI, doubt kidney stone, appendicitis based on benign exam   Patient's presentation is most consistent with acute complicated illness / injury requiring diagnostic workup.   PLAN: Urinalysis obtained from triage.  Patient appears comfortable currently with benign abdominal exam.  She does not appear restless, anxious.  She is not vomiting.  She is tachycardic but does report using cocaine today.  Will continue to monitor.  Will  give Zofran as needed.   MEDICATIONS GIVEN IN ED: Medications  ondansetron (ZOFRAN-ODT) disintegrating tablet 4 mg (has no administration in time range)  cephALEXin (KEFLEX) capsule 500 mg (500 mg Oral Given 07/23/23 0154)     ED COURSE: Urinalysis appears infected.  Will add on urine culture.  Will start on Keflex for UTI.  Plan will be to discharge with her counselor at 9:15 in the morning to go to [facility].   At this time, I do not feel there is any life-threatening condition present. I reviewed all nursing notes, vitals, pertinent previous records.  All lab and urine results, EKGs, imaging ordered have been independently reviewed and interpreted by myself.  I reviewed all available radiology reports from any imaging ordered this visit.  Based on my assessment, I feel the patient is safe to be discharged home without further emergent workup and can continue workup as an outpatient as needed. Discussed all findings, treatment plan as well as usual and customary return precautions.  They verbalize understanding and are comfortable with this plan.  Outpatient follow-up has been provided as needed.  All questions have been answered.   CONSULTS:  none   OUTSIDE RECORDS REVIEWED: Reviewed ENT note on 05/16/2021.       FINAL CLINICAL IMPRESSION(S) / ED DIAGNOSES   Final diagnoses:   Polysubstance abuse (HCC)  Acute UTI     Rx / DC Orders   ED Discharge Orders          Ordered    cephALEXin (KEFLEX) 500 MG capsule  2 times daily        07/23/23 0303             Note:  This document was prepared using Dragon voice recognition software and may include unintentional dictation errors.   Dejanae Helser, Layla Maw, DO 07/23/23 [phone]

## 2023-07-23 NOTE — ED Notes (Signed)
Pt asleep.

## 2023-07-23 NOTE — ED Triage Notes (Signed)
Pt states she is going to rehab tomorrow. She is coming off of crystal meth, cocaine, alcohol, and xanax. Pt last used cocaine 1500 today and the others within the past 2 days. Pt c/o nausea and dysuria. Pt is restless in triage. Pt reports she came in tonight to get away from her living environment so she didn't use again. Pt states her counselor will pick her up at 9am tomorrow to bring her to rehab. Pt denies SI/HI

## 2023-07-23 NOTE — ED Notes (Signed)
Verified correct patient and correct discharge papers given. Pt alert and oriented X 4, stable for discharge. RR even and unlabored, color WNL. Discussed discharge instructions and follow-up as directed. Discharge medications discussed, when prescribed. Pt had opportunity to ask questions, and RN available to provide patient and/or family education. Left with all belongings.

## 2023-07-26 LAB — URINE CULTURE: Culture: 20000 — AB

## 2023-11-23 ENCOUNTER — Other Ambulatory Visit: Payer: Self-pay

## 2023-11-23 ENCOUNTER — Emergency Department
Admission: EM | Admit: 2023-11-23 | Discharge: 2023-11-23 | Disposition: A | Discharge status: Home / Self Care | Payer: MEDICAID | Attending: Emergency Medicine | Admitting: Emergency Medicine

## 2023-11-23 DIAGNOSIS — K047 Periapical abscess without sinus: Secondary | ICD-10-CM | POA: Insufficient documentation

## 2023-11-23 DIAGNOSIS — K0889 Other specified disorders of teeth and supporting structures: Secondary | ICD-10-CM | POA: Diagnosis present

## 2023-11-23 MED ORDER — AMOXICILLIN 500 MG PO CAPS: 500.0000 mg | ORAL_CAPSULE | Freq: Three times a day (TID) | ORAL | 0 refills | Status: AC

## 2023-11-23 MED ORDER — TRAMADOL HCL 50 MG PO TABS: 50.0000 mg | ORAL_TABLET | Freq: Four times a day (QID) | ORAL | 0 refills | Status: DC | PRN

## 2023-11-23 NOTE — ED Provider Notes (Signed)
   [facility] Provider Note    Event Date/Time   First MD Initiated Contact with Patient 11/23/23 1021     (approximate)   History   Dental Pain   HPI  Erin [name] is a 53 y.o. female who presents with dental pain for several days at tooth 32.  Denies intraoral swelling, no difficulty swallowing.  No fevers.  Also reports lesion on tongue for about 3 weeks that will go away with brushing     Physical Exam   Triage Vital Signs: ED Triage Vitals  Encounter Vitals Group     BP 11/23/23 0957 113/85     Systolic BP Percentile --      Diastolic BP Percentile --      Pulse Rate 11/23/23 0957 (!) 105     Resp 11/23/23 0957 16     Temp 11/23/23 0957 98 F (36.7 C)     Temp Source 11/23/23 0957 Oral     SpO2 11/23/23 0957 100 %     Weight 11/23/23 0954 54.4 kg (119 lb 14.9 oz)     Height --      Head Circumference --      Peak Flow --      Pain Score 11/23/23 0954 8     Pain Loc --      Pain Education --      Exclude from Growth Chart --     Most recent vital signs: Vitals:   11/23/23 0957  BP: 113/85  Pulse: (!) 105  Resp: 16  Temp: 98 F (36.7 C)  SpO2: 100%     General: Awake, no distress.  CV:  Good peripheral perfusion.  Resp:  Normal effort.  Abd:  No distention.  Other:  No evidence of intraoral abscess, tenderness and mild erythema along the gums adjacent to tooth 32, small white lesion on the center of the tongue   ED Results / Procedures / Treatments   Labs (all labs ordered are listed, but only abnormal results are displayed) Labs Reviewed - No data to display   EKG     RADIOLOGY     PROCEDURES:  Critical Care performed:   Procedures   MEDICATIONS ORDERED IN ED: Medications - No data to display   IMPRESSION / MDM / ASSESSMENT AND PLAN / ED COURSE  I reviewed the triage vital signs and the nursing notes. Patient's presentation is most consistent with acute complicated illness / injury requiring  diagnostic workup.  Patient with exam consistent with dental infection, will start her on amoxicillin for this.  Also notable is lesion on the center of the tongue which is whitish and papular, urged her to have this further evaluated and possible biopsy which she agrees to do        FINAL CLINICAL IMPRESSION(S) / ED DIAGNOSES   Final diagnoses:  Dental infection     Rx / DC Orders   ED Discharge Orders          Ordered    amoxicillin (AMOXIL) 500 MG capsule  3 times daily        11/23/23 1034    traMADol (ULTRAM) 50 MG tablet  Every 6 hours PRN        11/23/23 1035             Note:  This document was prepared using Dragon voice recognition software and may include unintentional dictation errors.   Jene Every, MD 11/23/23 1038

## 2023-11-23 NOTE — Discharge Instructions (Signed)
 Please follow up with Dentist as we discussed for dental pain and lesion

## 2023-11-23 NOTE — ED Triage Notes (Signed)
 Right lower jaw dental pain x 3 weeks. Possible abscess.

## 2023-12-13 ENCOUNTER — Other Ambulatory Visit: Payer: Self-pay

## 2023-12-13 ENCOUNTER — Emergency Department
Admission: EM | Admit: 2023-12-13 | Discharge: 2023-12-13 | Disposition: A | Discharge status: Home / Self Care | Payer: MEDICAID | Attending: Emergency Medicine | Admitting: Emergency Medicine

## 2023-12-13 DIAGNOSIS — K2900 Acute gastritis without bleeding: Secondary | ICD-10-CM | POA: Insufficient documentation

## 2023-12-13 DIAGNOSIS — R1013 Epigastric pain: Secondary | ICD-10-CM

## 2023-12-13 LAB — URINALYSIS, ROUTINE W REFLEX MICROSCOPIC
Bacteria, UA: NONE SEEN
Bilirubin Urine: NEGATIVE
Glucose, UA: NEGATIVE mg/dL
Ketones, ur: NEGATIVE mg/dL
Leukocytes,Ua: NEGATIVE
Nitrite: NEGATIVE
Protein, ur: NEGATIVE mg/dL
RBC / HPF: 0 RBC/hpf (ref 0–5)
Specific Gravity, Urine: 1.005 (ref 1.005–1.030)
pH: 6 (ref 5.0–8.0)

## 2023-12-13 LAB — CBC
HCT: 41.8 % (ref 36.0–46.0)
Hemoglobin: 14.5 g/dL (ref 12.0–15.0)
MCH: 32.2 pg (ref 26.0–34.0)
MCHC: 34.7 g/dL (ref 30.0–36.0)
MCV: 92.7 fL (ref 80.0–100.0)
Platelets: 262 10*3/uL (ref 150–400)
RBC: 4.51 MIL/uL (ref 3.87–5.11)
RDW: 12 % (ref 11.5–15.5)
WBC: 7.8 10*3/uL (ref 4.0–10.5)
nRBC: 0 % (ref 0.0–0.2)

## 2023-12-13 LAB — COMPREHENSIVE METABOLIC PANEL
ALT: 14 U/L (ref 0–44)
AST: 17 U/L (ref 15–41)
Albumin: 4.7 g/dL (ref 3.5–5.0)
Alkaline Phosphatase: 49 U/L (ref 38–126)
Anion gap: 7 (ref 5–15)
BUN: 14 mg/dL (ref 6–20)
CO2: 27 mmol/L (ref 22–32)
Calcium: 9.6 mg/dL (ref 8.9–10.3)
Chloride: 107 mmol/L (ref 98–111)
Creatinine, Ser: 0.54 mg/dL (ref 0.44–1.00)
GFR, Estimated: >60 mL/min (ref >60)
Glucose, Bld: 103 mg/dL — ABNORMAL HIGH (ref 70–99)
Potassium: 4.1 mmol/L (ref 3.5–5.1)
Sodium: 141 mmol/L (ref 135–145)
Total Bilirubin: 0.4 mg/dL (ref 0.0–1.2)
Total Protein: 7.9 g/dL (ref 6.5–8.1)

## 2023-12-13 LAB — LIPASE, BLOOD: Lipase: 40 U/L (ref 11–51)

## 2023-12-13 MED ORDER — SUCRALFATE 1 G PO TABS: 1.0000 g | ORAL_TABLET | Freq: Four times a day (QID) | ORAL | 1 refills | Status: DC

## 2023-12-13 MED ORDER — DROPERIDOL 2.5 MG/ML IJ SOLN
2.5000 mg | Freq: Once | INTRAMUSCULAR | Status: AC
  Administered 2023-12-13: 2.5 mg via INTRAMUSCULAR
  Filled 2023-12-13: qty 2

## 2023-12-13 MED ORDER — LIDOCAINE VISCOUS HCL 2 % MT SOLN
15.0000 mL | Freq: Once | OROMUCOSAL | Status: AC
  Administered 2023-12-13: 15 mL via OROMUCOSAL
  Filled 2023-12-13: qty 15

## 2023-12-13 MED ORDER — SUCRALFATE 1 G PO TABS
1.0000 g | ORAL_TABLET | Freq: Once | ORAL | Status: AC
  Administered 2023-12-13: 1 g via ORAL
  Filled 2023-12-13: qty 1

## 2023-12-13 MED ORDER — FAMOTIDINE 20 MG PO TABS: 20.0000 mg | ORAL_TABLET | Freq: Two times a day (BID) | ORAL | 1 refills | Status: DC

## 2023-12-13 MED ORDER — FAMOTIDINE 20 MG PO TABS
20.0000 mg | ORAL_TABLET | Freq: Once | ORAL | Status: AC
  Administered 2023-12-13: 20 mg via ORAL
  Filled 2023-12-13: qty 1

## 2023-12-13 MED ORDER — ALUM & MAG HYDROXIDE-SIMETH 200-200-20 MG/5ML PO SUSP
30.0000 mL | Freq: Once | ORAL | Status: AC
  Administered 2023-12-13: 30 mL via ORAL
  Filled 2023-12-13: qty 30

## 2023-12-13 NOTE — ED Provider Notes (Signed)
 3:44 PM Assumed care for off going team.   Blood pressure 123/82, pulse 87, temperature 98 F (36.7 C), resp. rate 16, height 5\' 9"  (1.753 m), weight 63.5 kg, SpO2 100%.  See their HPI for full report but in brief pending med re-eval   Patient reports resolution of symptoms abdomen soft and nontender patient requesting discharge home.  Discharge instructions provided by off going Dr. Katrinka Blazing.       Concha Se, MD 12/13/23 [phone]

## 2023-12-13 NOTE — ED Notes (Signed)
 Pt instructed to wait 30+ minutes before eating or drinking to allow GI cocktail to be effective. Pt verbalizes understanding of instruction.

## 2023-12-13 NOTE — ED Provider Notes (Signed)
 [facility] Provider Note    Event Date/Time   First MD Initiated Contact with Patient 12/13/23 1405     (approximate)   History   Abdominal Pain   HPI  Erin [name] is a 53 y.o. female who presents to the ED for evaluation of Abdominal Pain   Patient presents to the ED for evaluation of 2 months of consistent epigastric burning radiating up her throat to her mouth.  Reports the past couple weeks has been unbearable, similar quality symptoms but of a more severe intensity over the past few weeks.  No emesis or stool/urinary changes.  No fevers, difficulty breathing or cough.  Reports occasionally taking omeprazole, her mother's medication, with minimal improvement.  Reports a lot of stress with moving to a different location, finding a new job, medical stressors with a lesion on her tongue that requires a biopsy and pathology and she reports concern for cancer as she is a lifelong smoker.   Physical Exam   Triage Vital Signs: ED Triage Vitals  Encounter Vitals Group     BP 12/13/23 1334 (!) 140/110     Systolic BP Percentile --      Diastolic BP Percentile --      Pulse Rate 12/13/23 1334 (!) 110     Resp 12/13/23 1334 17     Temp 12/13/23 1334 98 F (36.7 C)     Temp src --      SpO2 12/13/23 1334 98 %     Weight 12/13/23 1332 140 lb (63.5 kg)     Height 12/13/23 1332 5\' 9"  (1.753 m)     Head Circumference --      Peak Flow --      Pain Score 12/13/23 1332 6     Pain Loc --      Pain Education --      Exclude from Growth Chart --     Most recent vital signs: Vitals:   12/13/23 1334  BP: (!) 140/110  Pulse: (!) 110  Resp: 17  Temp: 98 F (36.7 C)  SpO2: 98%    General: Awake, no distress.  CV:  Good peripheral perfusion.  Resp:  Normal effort.  Abd:  No distention.  Soft and benign throughout without any tenderness MSK:  No deformity noted.  Neuro:  No focal deficits appreciated. Other:     ED Results / Procedures /  Treatments   Labs (all labs ordered are listed, but only abnormal results are displayed) Labs Reviewed  COMPREHENSIVE METABOLIC PANEL - Abnormal; Notable for the following components:      Result Value   Glucose, Bld 103 (*)    All other components within normal limits  LIPASE, BLOOD  CBC  URINALYSIS, ROUTINE W REFLEX MICROSCOPIC    EKG   RADIOLOGY   Official radiology report(s): No results found.  PROCEDURES and INTERVENTIONS:  Procedures  Medications  droperidol (INAPSINE) 2.5 MG/ML injection 2.5 mg (has no administration in time range)  alum & mag hydroxide-simeth (MAALOX/MYLANTA) 200-200-20 MG/5ML suspension 30 mL (30 mLs Oral Given 12/13/23 1424)  famotidine (PEPCID) tablet 20 mg (20 mg Oral Given 12/13/23 1424)  lidocaine (XYLOCAINE) 2 % viscous mouth solution 15 mL (15 mLs Mouth/Throat Given 12/13/23 1424)  sucralfate (CARAFATE) tablet 1 g (1 g Oral Given 12/13/23 1424)     IMPRESSION / MDM / ASSESSMENT AND PLAN / ED COURSE  I reviewed the triage vital signs and the nursing notes.  Differential diagnosis includes,  but is not limited to, gastritis, peptic ulcer disease, blood loss anemia, IBS, biliary colic or choledocholithiasis  {Patient presents with symptoms of an acute illness or injury that is potentially life-threatening.  Patient who is s/p cholecystectomy presents with chronic epigastric burning that I suspect is gastric in etiology.  Chronic symptoms, benign exam without any local tenderness.  Reassuring blood work with a normal CBC, metabolic panel and lipase.  Improving symptoms with management of gastritis.  Reassuring blood work with normal CBC, lipase and CMP.  Considering her lack of tenderness and reassuring blood work I do not think she needs CT imaging at this point.  No signs of hepatobiliary distraction and she is s/p cholecystectomy.  We will reassess after medications and likely refer to GI with outpatient management.      FINAL CLINICAL  IMPRESSION(S) / ED DIAGNOSES   Final diagnoses:  Acute gastritis without hemorrhage, unspecified gastritis type  Epigastric pain     Rx / DC Orders   ED Discharge Orders          Ordered    famotidine (PEPCID) 20 MG tablet  2 times daily        12/13/23 1455    sucralfate (CARAFATE) 1 g tablet  4 times daily        12/13/23 1455             Note:  This document was prepared using Dragon voice recognition software and may include unintentional dictation errors.   Delton Prairie, MD 12/13/23 [phone]

## 2023-12-13 NOTE — ED Notes (Signed)
Pt states she feels about the same.

## 2023-12-13 NOTE — ED Triage Notes (Signed)
 Pt comes with c/o belly pain for over month now. Pt states burning sensation in stomach and travels up. Pt states she also has lesion on tongue and was advised to go to dentist. Pt went to dentist on Wed and pt was referred to path.

## 2023-12-13 NOTE — Discharge Instructions (Addendum)
 As we discussed, please reach out to the GI doctor to be seen in the clinic to discuss an endoscopy.  In the meantime, start taking famotidine/Pepcid every day.  2 times per day to help reduce acid production in the stomach  Sucralfate/Carafate before meals is a helpful addition  Continue these medications and to see the GI doctor in the clinic

## 2023-12-16 ENCOUNTER — Telehealth: Payer: Self-pay

## 2023-12-16 NOTE — Telephone Encounter (Addendum)
 Per pt phone call would like to schedule EGD. Pt states she doesn't need to see anyone just need to schedule procedure. Referral was put in today.  Pt is going to [facility].

## 2023-12-26 ENCOUNTER — Encounter: Payer: Self-pay | Admitting: *Deleted

## 2023-12-27 ENCOUNTER — Ambulatory Visit
Admission: RE | Admit: 2023-12-27 | Discharge: 2023-12-27 | Disposition: A | Discharge status: Home / Self Care | Payer: MEDICAID | Attending: Gastroenterology | Admitting: Gastroenterology

## 2023-12-27 ENCOUNTER — Ambulatory Visit: Payer: MEDICAID | Admitting: Anesthesiology

## 2023-12-27 ENCOUNTER — Encounter
Admission: RE | Disposition: A | Discharge status: Home / Self Care | Payer: Self-pay | Source: Home / Self Care | Attending: Gastroenterology

## 2023-12-27 ENCOUNTER — Encounter: Payer: Self-pay | Admitting: *Deleted

## 2023-12-27 DIAGNOSIS — Z1211 Encounter for screening for malignant neoplasm of colon: Secondary | ICD-10-CM | POA: Diagnosis present

## 2023-12-27 DIAGNOSIS — M549 Dorsalgia, unspecified: Secondary | ICD-10-CM | POA: Diagnosis not present

## 2023-12-27 DIAGNOSIS — F17200 Nicotine dependence, unspecified, uncomplicated: Secondary | ICD-10-CM | POA: Diagnosis not present

## 2023-12-27 DIAGNOSIS — D122 Benign neoplasm of ascending colon: Secondary | ICD-10-CM | POA: Insufficient documentation

## 2023-12-27 DIAGNOSIS — Z83719 Family history of colon polyps, unspecified: Secondary | ICD-10-CM | POA: Diagnosis not present

## 2023-12-27 DIAGNOSIS — G8929 Other chronic pain: Secondary | ICD-10-CM | POA: Insufficient documentation

## 2023-12-27 DIAGNOSIS — J449 Chronic obstructive pulmonary disease, unspecified: Secondary | ICD-10-CM | POA: Diagnosis not present

## 2023-12-27 DIAGNOSIS — K219 Gastro-esophageal reflux disease without esophagitis: Secondary | ICD-10-CM | POA: Insufficient documentation

## 2023-12-27 DIAGNOSIS — K3189 Other diseases of stomach and duodenum: Secondary | ICD-10-CM | POA: Diagnosis not present

## 2023-12-27 DIAGNOSIS — D124 Benign neoplasm of descending colon: Secondary | ICD-10-CM | POA: Diagnosis not present

## 2023-12-27 DIAGNOSIS — D120 Benign neoplasm of cecum: Secondary | ICD-10-CM | POA: Insufficient documentation

## 2023-12-27 HISTORY — PX: COLONOSCOPY: SHX5424

## 2023-12-27 HISTORY — PX: ESOPHAGOGASTRODUODENOSCOPY: SHX5428

## 2023-12-27 SURGERY — COLONOSCOPY
Anesthesia: General

## 2023-12-27 MED ORDER — DEXMEDETOMIDINE HCL IN NACL 80 MCG/20ML IV SOLN
INTRAVENOUS | Status: DC | PRN
  Administered 2023-12-27 (×2): 20 ug via INTRAVENOUS

## 2023-12-27 MED ORDER — ALBUTEROL SULFATE HFA 108 (90 BASE) MCG/ACT IN AERS
INHALATION_SPRAY | RESPIRATORY_TRACT | Status: AC
  Filled 2023-12-27: qty 6.7

## 2023-12-27 MED ORDER — SODIUM CHLORIDE 0.9 % IV SOLN: INTRAVENOUS | Status: DC

## 2023-12-27 MED ORDER — PHENYLEPHRINE 80 MCG/ML (10ML) SYRINGE FOR IV PUSH (FOR BLOOD PRESSURE SUPPORT)
PREFILLED_SYRINGE | INTRAVENOUS | Status: DC | PRN
  Administered 2023-12-27 (×3): 80 ug via INTRAVENOUS

## 2023-12-27 MED ORDER — PROPOFOL 10 MG/ML IV BOLUS
INTRAVENOUS | Status: AC
  Filled 2023-12-27: qty 20

## 2023-12-27 MED ORDER — LIDOCAINE HCL (PF) 2 % IJ SOLN
INTRAMUSCULAR | Status: AC
Start: 2023-12-27 — End: ?
  Filled 2023-12-27: qty 5

## 2023-12-27 MED ORDER — ARTIFICIAL TEARS OPHTHALMIC OINT
TOPICAL_OINTMENT | OPHTHALMIC | Status: AC
Start: 2023-12-27 — End: ?
  Filled 2023-12-27: qty 3.5

## 2023-12-27 MED ORDER — PROPOFOL 10 MG/ML IV BOLUS
INTRAVENOUS | Status: DC | PRN
  Administered 2023-12-27 (×3): 30 mg via INTRAVENOUS
  Administered 2023-12-27 (×3): 50 mg via INTRAVENOUS

## 2023-12-27 MED ORDER — LIDOCAINE HCL (CARDIAC) PF 100 MG/5ML IV SOSY
PREFILLED_SYRINGE | INTRAVENOUS | Status: DC | PRN
  Administered 2023-12-27: 60 mg via INTRAVENOUS

## 2023-12-27 MED ORDER — GLYCOPYRROLATE 0.2 MG/ML IJ SOLN
INTRAMUSCULAR | Status: AC
  Filled 2023-12-27: qty 1

## 2023-12-27 MED ORDER — DIPHENHYDRAMINE HCL 50 MG/ML IJ SOLN
INTRAMUSCULAR | Status: AC
  Filled 2023-12-27: qty 1

## 2023-12-27 MED ORDER — ALBUTEROL SULFATE HFA 108 (90 BASE) MCG/ACT IN AERS
INHALATION_SPRAY | RESPIRATORY_TRACT | Status: DC | PRN
Start: 2023-12-27 — End: 2023-12-27
  Administered 2023-12-27: 6 via RESPIRATORY_TRACT

## 2023-12-27 MED ORDER — GLYCOPYRROLATE 0.2 MG/ML IJ SOLN
INTRAMUSCULAR | Status: DC | PRN
  Administered 2023-12-27: .2 mg via INTRAVENOUS

## 2023-12-27 MED ORDER — PHENYLEPHRINE 80 MCG/ML (10ML) SYRINGE FOR IV PUSH (FOR BLOOD PRESSURE SUPPORT)
PREFILLED_SYRINGE | INTRAVENOUS | Status: AC
Start: 2023-12-27 — End: ?
  Filled 2023-12-27: qty 20

## 2023-12-27 MED ORDER — PROPOFOL 500 MG/50ML IV EMUL
INTRAVENOUS | Status: DC | PRN
  Administered 2023-12-27: 125 ug/kg/min via INTRAVENOUS

## 2023-12-27 MED ORDER — MIDAZOLAM HCL 2 MG/2ML IJ SOLN
INTRAMUSCULAR | Status: AC
  Filled 2023-12-27: qty 2

## 2023-12-27 MED ORDER — LIDOCAINE HCL (PF) 2 % IJ SOLN
INTRAMUSCULAR | Status: AC
  Filled 2023-12-27: qty 5

## 2023-12-27 NOTE — Transfer of Care (Signed)
 Immediate Anesthesia Transfer of Care Note  Patient: Erin [name]  Procedure(s) Performed: COLONOSCOPY EGD (ESOPHAGOGASTRODUODENOSCOPY)  Patient Location: PACU  Anesthesia Type:General  Level of Consciousness: sedated  Airway & Oxygen Therapy: Patient Spontanous Breathing  Post-op Assessment: Report given to RN and Post -op Vital signs reviewed and stable  Post vital signs: Reviewed and stable  Last Vitals:  Vitals Value Taken Time  BP    Temp    Pulse    Resp    SpO2      Last Pain:  Vitals:   12/27/23 1331  TempSrc: Temporal  PainSc: 0-No pain         Complications: No notable events documented.

## 2023-12-27 NOTE — Interval H&P Note (Signed)
 History and Physical Interval Note:  12/27/2023 2:33 PM  Erin [name]  has presented today for surgery, with the diagnosis of 789.06 (ICD-9-CM) - R10.13 (ICD-10-CM) - Epigastric pain V18.51 (ICD-9-CM) - Z83.719 (ICD-10-CM) - Family history of polyps in the colon.  The various methods of treatment have been discussed with the patient and family. After consideration of risks, benefits and other options for treatment, the patient has consented to  Procedure(s): COLONOSCOPY (N/A) EGD (ESOPHAGOGASTRODUODENOSCOPY) (N/A) as a surgical intervention.  The patient's history has been reviewed, patient examined, no change in status, stable for surgery.  I have reviewed the patient's chart and labs.  Questions were answered to the patient's satisfaction.     Regis Bill  Ok to proceed with EGD/Colonoscopy

## 2023-12-27 NOTE — H&P (Signed)
 Outpatient short stay form Pre-procedure 12/27/2023  Regis Bill, MD  Primary Physician: System, Provider Not In  Reason for visit:  Dyspepsia/Colon cancer screening  History of present illness:    53 y/o lady with history of anxiety, alcohol abuse, bipolar, tobacco abuse, and COPD here for EGD for dyspepsia and colon cancer screening. No blood thinners. History of hysterectomy. No first degree relatives with GI malignancies.    Current Facility-Administered Medications:    0.9 %  sodium chloride infusion, , Intravenous, Continuous, Kambra Beachem, Rossie Muskrat, MD, Last Rate: 20 mL/hr at 12/27/23 1345, New Bag at 12/27/23 1345  Medications Prior to Admission  Medication Sig Dispense Refill Last Dose/Taking   famotidine (PEPCID) 20 MG tablet Take 1 tablet (20 mg total) by mouth 2 (two) times daily. 60 tablet 1 12/27/2023 at  6:00 AM   cephALEXin (KEFLEX) 500 MG capsule Take 1 capsule (500 mg total) by mouth 2 (two) times daily. 14 capsule 0    gabapentin (NEURONTIN) 800 MG tablet Take 1 tablet (800 mg total) by mouth daily. 30 tablet 0    mirtazapine (REMERON) 30 MG tablet Take 1 tablet (30 mg total) by mouth at bedtime. 30 tablet 1    sucralfate (CARAFATE) 1 g tablet Take 1 tablet (1 g total) by mouth 4 (four) times daily. 120 tablet 1    traMADol (ULTRAM) 50 MG tablet Take 1 tablet (50 mg total) by mouth every 6 (six) hours as needed. 20 tablet 0    traZODone (DESYREL) 100 MG tablet Take 1 tablet (100 mg total) by mouth at bedtime as needed for sleep. 30 tablet 1      Allergies  Allergen Reactions   Clonidine Derivatives Other (See Comments)    The patch causes seizures   Paxil [Paroxetine Hcl]    Simvastatin    Sulfa Drugs Cross Reactors Other (See Comments)    unknown     Past Medical History:  Diagnosis Date   Anxiety disorder    Arthritis    Back pain, chronic    Bipolar disorder (HCC)    Cellulitis    Cervicalgia    Chicken pox    Chronic back pain    COPD  (chronic obstructive pulmonary disease) (HCC)    Depression    Drug abuse (HCC)    GERD (gastroesophageal reflux disease)    Hx of migraines    Hyperlipidemia    Insomnia    Medical history non-contributory    Migraine    Nicotine dependence    Seizures (HCC)    pt had 1 seizure October 2012- no other history of seizures   Suicide attempt (HCC) 01/02/2020   Vitamin D deficiency     Review of systems:  Otherwise negative.    Physical Exam  Gen: Alert, oriented. Appears stated age.  HEENT: PERRLA. Lungs: No respiratory distress CV: RRR Abd: soft, benign, no masses Ext: No edema    Planned procedures: Proceed with EGD/colonoscopy. The patient understands the nature of the planned procedure, indications, risks, alternatives and potential complications including but not limited to bleeding, infection, perforation, damage to internal organs and possible oversedation/side effects from anesthesia. The patient agrees and gives consent to proceed.  Please refer to procedure notes for findings, recommendations and patient disposition/instructions.     Regis Bill, MD [facility] Gastroenterology

## 2023-12-27 NOTE — Op Note (Signed)
 [facility] Gastroenterology Patient Name: Erin [name] Procedure Date: 12/27/2023 2:34 PM MRN: [id number] Account #: [id number] Date of Birth: 07/23/1971 Admit Type: Outpatient Age: 53 Room: [facility] ENDO ROOM 3 Gender: Female Note Status: Finalized Instrument Name: Patton Salles Endoscope [id number] Procedure:             Upper GI endoscopy Indications:           Epigastric abdominal pain, Dyspepsia Providers:             Eather Colas MD, MD Referring MD:          no PCP Medicines:             Monitored Anesthesia Care Complications:         No immediate complications. Estimated blood loss:                         Minimal. Procedure:             Pre-Anesthesia Assessment:                        - Prior to the procedure, a History and Physical was                         performed, and patient medications and allergies were                         reviewed. The patient is competent. The risks and                         benefits of the procedure and the sedation options and                         risks were discussed with the patient. All questions                         were answered and informed consent was obtained.                         Patient identification and proposed procedure were                         verified by the physician, the nurse, the                         anesthesiologist, the anesthetist and the technician                         in the endoscopy suite. Mental Status Examination:                         alert and oriented. Airway Examination: normal                         oropharyngeal airway and neck mobility. Respiratory                         Examination: clear to auscultation. CV Examination:  normal. Prophylactic Antibiotics: The patient does not                         require prophylactic antibiotics. Prior                         Anticoagulants: The patient has taken no anticoagulant                         or  antiplatelet agents. ASA Grade Assessment: III - A                         patient with severe systemic disease. After reviewing                         the risks and benefits, the patient was deemed in                         satisfactory condition to undergo the procedure. The                         anesthesia plan was to use monitored anesthesia care                         (MAC). Immediately prior to administration of                         medications, the patient was re-assessed for adequacy                         to receive sedatives. The heart rate, respiratory                         rate, oxygen saturations, blood pressure, adequacy of                         pulmonary ventilation, and response to care were                         monitored throughout the procedure. The physical                         status of the patient was re-assessed after the                         procedure.                        After obtaining informed consent, the endoscope was                         passed under direct vision. Throughout the procedure,                         the patient's blood pressure, pulse, and oxygen                         saturations were monitored continuously. The Endoscope  was introduced through the mouth, and advanced to the                         second part of duodenum. The upper GI endoscopy was                         accomplished without difficulty. The patient tolerated                         the procedure well. Findings:      The examined esophagus was normal.      Patchy mildly erythematous mucosa without bleeding was found in the       gastric antrum. Biopsies were taken with a cold forceps for Helicobacter       pylori testing. Estimated blood loss was minimal.      The examined duodenum was normal. Impression:            - Normal esophagus.                        - Erythematous mucosa in the antrum. Biopsied.                         - Normal examined duodenum. Recommendation:        - Discharge patient to home.                        - Resume previous diet.                        - Continue present medications.                        - Await pathology results.                        - Return to referring physician as previously                         scheduled. Procedure Code(s):     --- Professional ---                        [phone], Esophagogastroduodenoscopy, flexible,                         transoral; with biopsy, single or multiple Diagnosis Code(s):     --- Professional ---                        K31.89, Other diseases of stomach and duodenum                        R10.13, Epigastric pain CPT copyright 2022 American [employer] Association. All rights reserved. The codes documented in this report are preliminary and upon coder review may  be revised to meet current compliance requirements. Eather Colas MD, MD 12/27/2023 3:28:34 PM Number of Addenda: 0 Note Initiated On: 12/27/2023 2:34 PM Estimated Blood Loss:  Estimated blood loss was minimal.      [facility]

## 2023-12-27 NOTE — Anesthesia Postprocedure Evaluation (Signed)
 Anesthesia Post Note  Patient: Erin [name]  Procedure(s) Performed: COLONOSCOPY EGD (ESOPHAGOGASTRODUODENOSCOPY)  Patient location during evaluation: Endoscopy Anesthesia Type: General Level of consciousness: awake and alert Pain management: pain level controlled Vital Signs Assessment: post-procedure vital signs reviewed and stable Respiratory status: spontaneous breathing, nonlabored ventilation, respiratory function stable and patient connected to nasal cannula oxygen Cardiovascular status: blood pressure returned to baseline and stable Postop Assessment: no apparent nausea or vomiting Anesthetic complications: no   No notable events documented.   Last Vitals:  Vitals:   12/27/23 1540 12/27/23 1550  BP: 106/74 111/80  Pulse:    Resp:    Temp:    SpO2:      Last Pain:  Vitals:   12/27/23 1550  TempSrc:   PainSc: 0-No pain                 Cleda Mccreedy Ayumi Wangerin

## 2023-12-27 NOTE — Anesthesia Preprocedure Evaluation (Addendum)
 Anesthesia Evaluation  Patient identified by MRN, date of birth, ID band Patient awake    Reviewed: Allergy & Precautions, NPO status , Patient's Chart, lab work & pertinent test results  Airway Mallampati: II  TM Distance: >3 FB Neck ROM: full    Dental  (+) Teeth Intact   Pulmonary COPD,  COPD inhaler, Current Smoker   Pulmonary exam normal  + decreased breath sounds      Cardiovascular Exercise Tolerance: Good negative cardio ROS Normal cardiovascular exam Rhythm:Regular Rate:Normal     Neuro/Psych  Headaches, Seizures -, Well Controlled,   Anxiety  Bipolar Disorder   negative neurological ROS  negative psych ROS   GI/Hepatic negative GI ROS, Neg liver ROS,GERD  Medicated,,  Endo/Other  negative endocrine ROS    Renal/GU negative Renal ROS  negative genitourinary   Musculoskeletal  (+) Arthritis ,    Abdominal Normal abdominal exam  (+)   Peds negative pediatric ROS (+)  Hematology negative hematology ROS (+)   Anesthesia Other Findings Past Medical History: No date: Anxiety disorder No date: Arthritis No date: Back pain, chronic No date: Bipolar disorder (HCC) No date: Cellulitis No date: Cervicalgia No date: Chicken pox No date: Chronic back pain No date: COPD (chronic obstructive pulmonary disease) (HCC) No date: Depression No date: Drug abuse (HCC) No date: GERD (gastroesophageal reflux disease) No date: Hx of migraines No date: Hyperlipidemia No date: Insomnia No date: Medical history non-contributory No date: Migraine No date: Nicotine dependence No date: Seizures [facility])     Comment:  pt had 1 seizure October 2012- no other history of               seizures 01/02/2020: Suicide attempt (HCC) No date: Vitamin D deficiency  Past Surgical History: No date: ABDOMINAL HYSTERECTOMY No date: BREAST SURGERY No date: CESAREAN SECTION No date: CHOLECYSTECTOMY No date:  ESOPHAGOGASTRODUODENOSCOPY 09/23/2018: ESOPHAGOGASTRODUODENOSCOPY (EGD) WITH PROPOFOL; N/A     Comment:  Procedure: ESOPHAGOGASTRODUODENOSCOPY (EGD) WITH               PROPOFOL;  Surgeon: Toney Reil, MD;  Location:               [facility] ENDOSCOPY;  Service: Gastroenterology;  Laterality:               N/A;  BMI    Body Mass Index: 20.53 kg/m      Reproductive/Obstetrics negative OB ROS                              Anesthesia Physical Anesthesia Plan  ASA: 2  Anesthesia Plan: General   Post-op Pain Management:    Induction: Intravenous  PONV Risk Score and Plan: Propofol infusion and TIVA  Airway Management Planned: Natural Airway and Nasal Cannula  Additional Equipment:   Intra-op Plan:   Post-operative Plan:   Informed Consent: I have reviewed the patients History and Physical, chart, labs and discussed the procedure including the risks, benefits and alternatives for the proposed anesthesia with the patient or authorized representative who has indicated his/her understanding and acceptance.     Dental Advisory Given  Plan Discussed with: CRNA  Anesthesia Plan Comments:         Anesthesia Quick Evaluation

## 2023-12-27 NOTE — Op Note (Addendum)
 [facility] Gastroenterology Patient Name: Erin [name] Procedure Date: 12/27/2023 2:32 PM MRN: [id number] Account #: [id number] Date of Birth: 07-16-1971 Admit Type: Outpatient Age: 53 Room: [facility] ENDO ROOM 3 Gender: Female Note Status: Supervisor Override Instrument Name: Prentice Docker [id number] Procedure:             Colonoscopy Indications:           Screening for colorectal malignant neoplasm, Colon                         cancer screening in patient at increased risk: Family                         history of 1st-degree relative with colon polyps Providers:             Eather Colas MD, MD Referring MD:          no PCP Medicines:             Monitored Anesthesia Care Complications:         No immediate complications. Estimated blood loss:                         Minimal. Procedure:             Pre-Anesthesia Assessment:                        - Prior to the procedure, a History and Physical was                         performed, and patient medications and allergies were                         reviewed. The patient is competent. The risks and                         benefits of the procedure and the sedation options and                         risks were discussed with the patient. All questions                         were answered and informed consent was obtained.                         Patient identification and proposed procedure were                         verified by the physician, the nurse, the                         anesthesiologist, the anesthetist and the technician                         in the endoscopy suite. Mental Status Examination:                         alert and oriented. Airway Examination: normal  oropharyngeal airway and neck mobility. Respiratory                         Examination: clear to auscultation. CV Examination:                         normal. Prophylactic Antibiotics: The patient does not                          require prophylactic antibiotics. Prior                         Anticoagulants: The patient has taken no anticoagulant                         or antiplatelet agents. ASA Grade Assessment: III - A                         patient with severe systemic disease. After reviewing                         the risks and benefits, the patient was deemed in                         satisfactory condition to undergo the procedure. The                         anesthesia plan was to use monitored anesthesia care                         (MAC). Immediately prior to administration of                         medications, the patient was re-assessed for adequacy                         to receive sedatives. The heart rate, respiratory                         rate, oxygen saturations, blood pressure, adequacy of                         pulmonary ventilation, and response to care were                         monitored throughout the procedure. The physical                         status of the patient was re-assessed after the                         procedure.                        After obtaining informed consent, the colonoscope was                         passed under direct vision. Throughout the procedure,  the patient's blood pressure, pulse, and oxygen                         saturations were monitored continuously. The                         Colonoscope was introduced through the anus and                         advanced to the the terminal ileum, with                         identification of the appendiceal orifice and IC                         valve. The colonoscopy was performed without                         difficulty. The patient tolerated the procedure well.                         The quality of the bowel preparation was good. The                         terminal ileum, ileocecal valve, appendiceal orifice,                         and rectum were  photographed. Findings:      The perianal and digital rectal examinations were normal.      The terminal ileum appeared normal.      Two sessile polyps were found in the cecum. The polyps were 3 to 5 mm in       size. These polyps were removed with a cold snare. Resection and       retrieval were complete. Estimated blood loss was minimal.      Three sessile polyps were found in the ascending colon. The polyps were       3 to 8 mm in size. These polyps were removed with a cold snare.       Resection and retrieval were complete. Estimated blood loss was minimal.      Two sessile polyps were found in the descending colon. The polyps were 3       to 4 mm in size. These polyps were removed with a cold snare. Resection       and retrieval were complete. Estimated blood loss was minimal.      The exam was otherwise without abnormality on direct and retroflexion       views. Impression:            - The examined portion of the ileum was normal.                        - Two 3 to 5 mm polyps in the cecum, removed with a                         cold snare. Resected and retrieved.                        - Three 3 to 8  mm polyps in the ascending colon,                         removed with a cold snare. Resected and retrieved.                        - Two 3 to 4 mm polyps in the descending colon,                         removed with a cold snare. Resected and retrieved.                        - The examination was otherwise normal on direct and                         retroflexion views. Recommendation:        - Discharge patient to home.                        - Resume previous diet.                        - Continue present medications.                        - Await pathology results.                        - Repeat colonoscopy in 3 years for surveillance.                        - Return to referring physician as previously                         scheduled. Procedure Code(s):     ---  Professional ---                        [phone], Colonoscopy, flexible; with removal of                         tumor(s), polyp(s), or other lesion(s) by snare                         technique Diagnosis Code(s):     --- Professional ---                        Z12.11, Encounter for screening for malignant neoplasm                         of colon                        D12.0, Benign neoplasm of cecum                        D12.2, Benign neoplasm of ascending colon                        D12.4, Benign neoplasm of descending colon CPT copyright 2022 American Medical Association. All rights reserved. The codes documented  in this report are preliminary and upon coder review may  be revised to meet current compliance requirements. Eather Colas MD, MD 12/27/2023 3:31:50 PM Number of Addenda: 0 Note Initiated On: 12/27/2023 2:32 PM Scope Withdrawal Time: 0 hours 11 minutes 1 second  Total Procedure Duration: 0 hours 17 minutes 54 seconds  Estimated Blood Loss:  Estimated blood loss was minimal.      [facility]

## 2023-12-30 ENCOUNTER — Encounter: Payer: Self-pay | Admitting: Gastroenterology

## 2023-12-30 LAB — SURGICAL PATHOLOGY

## 2024-01-21 NOTE — Progress Notes (Deleted)
 New patient visit   Patient: Erin [name]   DOB: 24-Jul-1971   53 y.o. Female  MRN: [id number] Visit Date: 01/24/2024  Today's healthcare provider: Mimi Alt, MD   No chief complaint on file.  Subjective    Erin [name] is a 53 y.o. female who presents today as a new patient to establish care.      Discussed the use of AI scribe software for clinical note transcription with the patient, who gave verbal consent to proceed.  History of Present Illness       Past Medical History:  Diagnosis Date   Anxiety disorder    Arthritis    Back pain, chronic    Bipolar disorder (HCC)    Cellulitis    Cervicalgia    Chicken pox    Chronic back pain    COPD (chronic obstructive pulmonary disease) (HCC)    Depression    Drug abuse (HCC)    GERD (gastroesophageal reflux disease)    Hx of migraines    Hyperlipidemia    Insomnia    Medical history non-contributory    Migraine    Nicotine  dependence    Seizures (HCC)    pt had 1 seizure October 2012- no other history of seizures   Suicide attempt (HCC) 01/02/2020   Vitamin D  deficiency     Outpatient Medications Prior to Visit  Medication Sig   cephALEXin  (KEFLEX ) 500 MG capsule Take 1 capsule (500 mg total) by mouth 2 (two) times daily.   famotidine  (PEPCID ) 20 MG tablet Take 1 tablet (20 mg total) by mouth 2 (two) times daily.   gabapentin  (NEURONTIN ) 800 MG tablet Take 1 tablet (800 mg total) by mouth daily.   mirtazapine  (REMERON ) 30 MG tablet Take 1 tablet (30 mg total) by mouth at bedtime.   sucralfate  (CARAFATE ) 1 g tablet Take 1 tablet (1 g total) by mouth 4 (four) times daily.   traMADol  (ULTRAM ) 50 MG tablet Take 1 tablet (50 mg total) by mouth every 6 (six) hours as needed.   traZODone  (DESYREL ) 100 MG tablet Take 1 tablet (100 mg total) by mouth at bedtime as needed for sleep.   No facility-administered medications prior to visit.    Past Surgical History:  Procedure Laterality Date    ABDOMINAL HYSTERECTOMY     BREAST SURGERY     CESAREAN SECTION     CHOLECYSTECTOMY     COLONOSCOPY N/A 12/27/2023   Procedure: COLONOSCOPY;  Surgeon: Shane Darling, MD;  Location: [facility] ENDOSCOPY;  Service: Endoscopy;  Laterality: N/A;   ESOPHAGOGASTRODUODENOSCOPY     ESOPHAGOGASTRODUODENOSCOPY N/A 12/27/2023   Procedure: EGD (ESOPHAGOGASTRODUODENOSCOPY);  Surgeon: Shane Darling, MD;  Location: [facility] ENDOSCOPY;  Service: Endoscopy;  Laterality: N/A;   ESOPHAGOGASTRODUODENOSCOPY (EGD) WITH PROPOFOL  N/A 09/23/2018   Procedure: ESOPHAGOGASTRODUODENOSCOPY (EGD) WITH PROPOFOL ;  Surgeon: Selena Daily, MD;  Location: [facility] ENDOSCOPY;  Service: Gastroenterology;  Laterality: N/A;   Family Status  Relation Name Status   Mother  Alive   Father  Deceased at age 32       suicide    Sister  Alive   MGM  (Not Specified)   MGF  (Not Specified)   Ala Alice  (Not Specified)   Cousin  (Not Specified)  No partnership data on file   Family History  Problem Relation Age of Onset   Diabetes Mother    Depression Mother    Anxiety disorder Mother    Alcohol abuse Father  Asthma Father    COPD Father    Early death Father    Bipolar disorder Father    Heart failure Sister        from chemotherapy    Arthritis Maternal Grandmother    Stroke Maternal Grandmother    Pancreatic cancer Maternal Grandfather    Anxiety disorder Maternal Grandfather    Depression Maternal Grandfather    Bipolar disorder Paternal Uncle    Bipolar disorder Cousin    Social History   Socioeconomic History   Marital status: Divorced    Spouse name: Not on file   Number of children: Not on file   Years of education: Not on file   Highest education level: Not on file  Occupational History   Not on file  Tobacco Use   Smoking status: Every Day    Current packs/day: 0.50    Average packs/day: 0.5 packs/day for 25.0 years (12.5 ttl pk-yrs)    Types: Cigarettes   Smokeless tobacco: Never  Vaping Use    Vaping status: Never Used  Substance and Sexual Activity   Alcohol use: No    Alcohol/week: 0.0 standard drinks of alcohol    Comment: none for 3 yrs   Drug use: Yes    Types: Methamphetamines    Comment: 6 months ago   Sexual activity: Yes    Birth control/protection: None  Other Topics Concern   Not on file  Social History Narrative   Not on file   Social Drivers of Health   Financial Resource Strain: High Risk (12/19/2023)   Received from [facility]   Overall Financial Resource Strain (CARDIA)    Difficulty of Paying Living Expenses: Hard  Food Insecurity: Food Insecurity Present (12/19/2023)   Received from [facility]   Hunger Vital Sign    Worried About Running Out of Food in the Last Year: Sometimes true    Ran Out of Food in the Last Year: Never true  Transportation Needs: No Transportation Needs (12/19/2023)   Received from [facility] - Transportation    In the past 12 months, has lack of transportation kept you from medical appointments or from getting medications?: No    Lack of Transportation (Non-Medical): No  Physical Activity: Not on file  Stress: Not on file  Social Connections: Not on file     Allergies  Allergen Reactions   Clonidine  Derivatives Other (See Comments)    The patch causes seizures   Paxil [Paroxetine Hcl]    Simvastatin    Sulfa Drugs Cross Reactors Other (See Comments)    unknown    Immunization History  Administered Date(s) Administered   PPD Test 05/21/2023   Tdap 01/02/2020    Health Maintenance  Topic Date Due   HIV Screening  Never done   Hepatitis C Screening  Never done   Pneumococcal Vaccine 64-43 Years old (1 of 2 - PCV) Never done   Cervical Cancer Screening (HPV/Pap Cotest)  Never done   MAMMOGRAM  Never done   Zoster Vaccines- Shingrix (1 of 2) Never done   COVID-19 Vaccine (1 - 2024-25 season) Never done   INFLUENZA VACCINE  05/08/2024    DTaP/Tdap/Td (2 - Td or Tdap) 01/01/2030   Colonoscopy  12/26/2033   HPV VACCINES  Aged Out   Meningococcal B Vaccine  Aged Out    Patient Care Team: System, Provider Not In as PCP - General  Review of Systems  {Insert  previous labs (optional):23779} {See past labs  Heme  Chem  Endocrine  Serology  Results Review (optional):1}   Objective    There were no vitals taken for this visit. {Insert last BP/Wt (optional):23777}{See vitals history (optional):1}    Depression Screen    07/08/2017    3:22 PM  PHQ 2/9 Scores  PHQ - 2 Score 1   No results found for any visits on 01/24/24.   Physical Exam ***    Assessment & Plan      Problem List Items Addressed This Visit   None   Assessment and Plan Assessment & Plan       No follow-ups on file.      Mimi Alt, MD  [facility] [phone] (phone) [phone] (fax)  [facility]

## 2024-01-24 ENCOUNTER — Ambulatory Visit: Payer: MEDICAID | Admitting: Family Medicine

## 2024-03-25 ENCOUNTER — Ambulatory Visit: Payer: Self-pay | Admitting: *Deleted

## 2024-03-25 NOTE — Telephone Encounter (Signed)
 FYI Only or Action Required?: FYI only for provider  Patient called Nurse Triage reporting Urinary Frequency. Symptoms began yesterday. Interventions attempted: Nothing. Symptoms are: unchanged.  Triage Disposition: See Physician Within 24 Hours  Patient/caregiver understands and will follow disposition?: Yes                  Copied from CRM [id number]. Topic: Clinical - Red Word Triage >> Mar 25, 2024 10:44 AM Emylou G wrote: [facility] that prompted transfer to Nurse Triage: UTI: frequency.. very little coming out.. pain in abdomen Reason for Disposition  Side (flank) or lower back pain present  Answer Assessment - Initial Assessment Questions This RN recommends patient goes to urgent care within 24 hours as no appointment availability in office. Patient agreeable.      Urinary frequency started yesterday morning Feels like bladder is full- not much comes out Pain in abdomen, 4/10 pain level Lower back pain, 6/10 pain level, changes pain level Denies blood in urine, fever  Protocols used: Urinary Symptoms-A-AH

## 2024-04-02 ENCOUNTER — Emergency Department
Admission: EM | Admit: 2024-04-02 | Discharge: 2024-04-02 | Disposition: A | Discharge status: Home / Self Care | Payer: MEDICAID | Attending: Emergency Medicine | Admitting: Emergency Medicine

## 2024-04-02 ENCOUNTER — Other Ambulatory Visit: Payer: Self-pay

## 2024-04-02 DIAGNOSIS — F419 Anxiety disorder, unspecified: Secondary | ICD-10-CM | POA: Insufficient documentation

## 2024-04-02 DIAGNOSIS — J449 Chronic obstructive pulmonary disease, unspecified: Secondary | ICD-10-CM | POA: Diagnosis not present

## 2024-04-02 DIAGNOSIS — T360X5A Adverse effect of penicillins, initial encounter: Secondary | ICD-10-CM | POA: Diagnosis not present

## 2024-04-02 DIAGNOSIS — T50905A Adverse effect of unspecified drugs, medicaments and biological substances, initial encounter: Secondary | ICD-10-CM

## 2024-04-02 LAB — COMPREHENSIVE METABOLIC PANEL WITH GFR
ALT: 24 U/L (ref 0–44)
AST: 18 U/L (ref 15–41)
Albumin: 4.6 g/dL (ref 3.5–5.0)
Alkaline Phosphatase: 79 U/L (ref 38–126)
Anion gap: 9 (ref 5–15)
BUN: 12 mg/dL (ref 6–20)
CO2: 27 mmol/L (ref 22–32)
Calcium: 9.6 mg/dL (ref 8.9–10.3)
Chloride: 105 mmol/L (ref 98–111)
Creatinine, Ser: 0.63 mg/dL (ref 0.44–1.00)
GFR, Estimated: >60 mL/min (ref >60)
Glucose, Bld: 121 mg/dL — ABNORMAL HIGH (ref 70–99)
Potassium: 4.1 mmol/L (ref 3.5–5.1)
Sodium: 141 mmol/L (ref 135–145)
Total Bilirubin: 0.6 mg/dL (ref 0.0–1.2)
Total Protein: 7.6 g/dL (ref 6.5–8.1)

## 2024-04-02 LAB — URINALYSIS, ROUTINE W REFLEX MICROSCOPIC
Bilirubin Urine: NEGATIVE
Glucose, UA: NEGATIVE mg/dL
Hgb urine dipstick: NEGATIVE
Ketones, ur: NEGATIVE mg/dL
Leukocytes,Ua: NEGATIVE
Nitrite: NEGATIVE
Protein, ur: NEGATIVE mg/dL
Specific Gravity, Urine: 1.018 (ref 1.005–1.030)
pH: 5 (ref 5.0–8.0)

## 2024-04-02 LAB — CBC
HCT: 39.8 % (ref 36.0–46.0)
Hemoglobin: 13.5 g/dL (ref 12.0–15.0)
MCH: 31 pg (ref 26.0–34.0)
MCHC: 33.9 g/dL (ref 30.0–36.0)
MCV: 91.5 fL (ref 80.0–100.0)
Platelets: 293 10*3/uL (ref 150–400)
RBC: 4.35 MIL/uL (ref 3.87–5.11)
RDW: 11.7 % (ref 11.5–15.5)
WBC: 8.6 10*3/uL (ref 4.0–10.5)
nRBC: 0 % (ref 0.0–0.2)

## 2024-04-02 LAB — LIPASE, BLOOD: Lipase: 27 U/L (ref 11–51)

## 2024-04-02 MED ORDER — HYDROXYZINE HCL 25 MG PO TABS: 25.0000 mg | ORAL_TABLET | Freq: Three times a day (TID) | ORAL | 0 refills | Status: DC | PRN

## 2024-04-02 NOTE — ED Triage Notes (Signed)
 Pt here with abd pain x1 week. Pt states she went to UC and was dx with a UTI and given abx. Pt states she took 2 and then began trembling. Pt now states her new abx makes her nauseous. Pt also thinks she is dehydrated. Pt having pain with urination and lower back pain.

## 2024-04-02 NOTE — ED Provider Notes (Signed)
 [facility] Provider Note    Event Date/Time   First MD Initiated Contact with Patient 04/02/24 1542     (approximate)  History   Chief Complaint: Abdominal Pain  HPI  ALLEX MADIA is a 53 y.o. female with a past medical history of anxiety, bipolar, COPD, gastric reflux, migraines, presents to the emergency department with various complaints.  According to the patient approximately a week and a half ago she was diagnosed with urinary tract infection, was prescribed Augmentin.  Patient states shortly after taking Augmentin she began feeling trembling throughout her body and feeling very hyperactive she called her doctor who switched the Augmentin to amoxicillin .  She has started taking that medication has several doses left but again continues to feel trembling on the inside so the patient came to the emergency department for evaluation.  Physical Exam   Triage Vital Signs: ED Triage Vitals  Encounter Vitals Group     BP 04/02/24 1411 102/76     Girls Systolic BP Percentile --      Girls Diastolic BP Percentile --      Boys Systolic BP Percentile --      Boys Diastolic BP Percentile --      Pulse Rate 04/02/24 1411 (!) 109     Resp 04/02/24 1411 17     Temp 04/02/24 1414 98.4 F (36.9 C)     Temp Source 04/02/24 1414 Oral     SpO2 04/02/24 1411 97 %     Weight 04/02/24 1412 138 lb 14.2 oz (63 kg)     Height 04/02/24 1412 5' 9 (1.753 m)     Head Circumference --      Peak Flow --      Pain Score 04/02/24 1411 8     Pain Loc --      Pain Education --      Exclude from Growth Chart --     Most recent vital signs: Vitals:   04/02/24 1411 04/02/24 1414  BP: 102/76   Pulse: (!) 109   Resp: 17   Temp:  98.4 F (36.9 C)  SpO2: 97%     General: Awake, patient with rapid speech at times, appears quite anxious. CV:  Good peripheral perfusion.  Regular rate and rhythm  Resp:  Normal effort.  Equal breath sounds bilaterally.  Abd:  No distention.   Soft, nontender.  No rebound or guarding.  ED Results / Procedures / Treatments    MEDICATIONS ORDERED IN ED: Medications - No data to display   IMPRESSION / MDM / ASSESSMENT AND PLAN / ED COURSE  I reviewed the triage vital signs and the nursing notes.  Patient's presentation is most consistent with acute presentation with potential threat to life or bodily function.  Patient presents emergency department with complaints of feeling hyperactive with internal trembling after taking Augmentin and amoxicillin .  Patient is very anxious in appearance on examination.  Patient admits she has a history of anxiety but states this feels different than her typical anxiety.  Patient denies any fever or chest pain.  Patient's workup is reassuring with a normal CBC normal chemistry normal LFTs normal lipase.  Normal urinalysis with no signs of urinary tract infection.  I had a long discussion with the patient given her reassuring workup.  I anxious.  I highly suspect this is going to be more anxiety induced symptoms could possibly be a medication reaction.  Given her reassuring labs and reassuring urinalysis I believe  the patient can safely stop the amoxicillin  that she has been on now for a 1 week.  I also discussed with the patient a trial of hydroxyzine  to be used if needed for her symptoms.  Patient states she has called a PCP to get care established and has an appointment in approximately 10 days.  Discussed with patient trial of hydroxyzine  to be used if needed specially at night as she states she has not been sleeping very much.  Patient agreeable to plan of care.  FINAL CLINICAL IMPRESSION(S) / ED DIAGNOSES   Medication reaction Anxiety    Note:  This document was prepared using Dragon voice recognition software and may include unintentional dictation errors.   Dorothyann Drivers, MD 04/02/24 1623

## 2024-04-14 ENCOUNTER — Encounter: Payer: Self-pay | Admitting: Family Medicine

## 2024-04-14 ENCOUNTER — Ambulatory Visit: Payer: MEDICAID | Admitting: Family Medicine

## 2024-04-14 VITALS — BP 113/78 | HR 103 | Ht 69.0 in | Wt 130.0 lb

## 2024-04-14 DIAGNOSIS — N3000 Acute cystitis without hematuria: Secondary | ICD-10-CM

## 2024-04-14 DIAGNOSIS — F319 Bipolar disorder, unspecified: Secondary | ICD-10-CM

## 2024-04-14 DIAGNOSIS — K219 Gastro-esophageal reflux disease without esophagitis: Secondary | ICD-10-CM

## 2024-04-14 DIAGNOSIS — R768 Other specified abnormal immunological findings in serum: Secondary | ICD-10-CM

## 2024-04-14 DIAGNOSIS — E7800 Pure hypercholesterolemia, unspecified: Secondary | ICD-10-CM

## 2024-04-14 DIAGNOSIS — J42 Unspecified chronic bronchitis: Secondary | ICD-10-CM | POA: Diagnosis not present

## 2024-04-14 DIAGNOSIS — R350 Frequency of micturition: Secondary | ICD-10-CM

## 2024-04-14 DIAGNOSIS — R52 Pain, unspecified: Secondary | ICD-10-CM

## 2024-04-14 DIAGNOSIS — Z720 Tobacco use: Secondary | ICD-10-CM

## 2024-04-14 DIAGNOSIS — G43C1 Periodic headache syndromes in child or adult, intractable: Secondary | ICD-10-CM

## 2024-04-14 DIAGNOSIS — F5104 Psychophysiologic insomnia: Secondary | ICD-10-CM

## 2024-04-14 DIAGNOSIS — F1510 Other stimulant abuse, uncomplicated: Secondary | ICD-10-CM

## 2024-04-14 DIAGNOSIS — F1110 Opioid abuse, uncomplicated: Secondary | ICD-10-CM

## 2024-04-14 DIAGNOSIS — F1010 Alcohol abuse, uncomplicated: Secondary | ICD-10-CM

## 2024-04-14 DIAGNOSIS — E785 Hyperlipidemia, unspecified: Secondary | ICD-10-CM

## 2024-04-14 LAB — POCT URINALYSIS DIPSTICK
Blood, UA: NEGATIVE
Glucose, UA: NEGATIVE
Ketones, UA: NEGATIVE
Leukocytes, UA: NEGATIVE
Nitrite, UA: POSITIVE
Protein, UA: POSITIVE — AB
Spec Grav, UA: 1.025 (ref 1.010–1.025)
Urobilinogen, UA: 2 U/dL — AB
pH, UA: 6 (ref 5.0–8.0)

## 2024-04-14 MED ORDER — ESCITALOPRAM OXALATE 10 MG PO TABS: 10.0000 mg | ORAL_TABLET | Freq: Every day | ORAL | 3 refills | Status: DC

## 2024-04-14 MED ORDER — CEPHALEXIN 500 MG PO CAPS: 500.0000 mg | ORAL_CAPSULE | Freq: Three times a day (TID) | ORAL | 0 refills | Status: DC

## 2024-04-14 NOTE — Progress Notes (Signed)
 New patient visit   Patient: Erin [name]   DOB: 1971-01-15   53 y.o. Female  MRN: [id number] Visit Date: 04/14/2024  Today's healthcare provider: Rockie Agent, MD   Chief Complaint  Patient presents with   Establish Care    Discuss treatment for anxiety and depression, she also was treated for UTI she was advised to stop because her urine was clear, once she stopped the symptoms came back, she also thinks she is having an reaction to AUGMENTIN and was given amoxicillin -clavulanate she feels the same with that one (vibrating on the inside)   Subjective    Erin [name] is a 53 y.o. female who presents today as a new patient to establish care.   HPI     Establish Care    Additional comments: Discuss treatment for anxiety and depression, she also was treated for UTI she was advised to stop because her urine was clear, once she stopped the symptoms came back, she also thinks she is having an reaction to AUGMENTIN and was given amoxicillin -clavulanate she feels the same with that one (vibrating on the inside)      Last edited by Erin [name], CMA on 04/14/2024  3:39 PM.       Discussed the use of AI scribe software for clinical note transcription with the patient, who gave verbal consent to proceed.  History of Present Illness Erin [name] is a 53 year old female with anxiety and depression who presents to establish care and address her mental health concerns.  She experiences anxiety as a vibrating sensation inside her body, described as feeling like 'a raging bull,' persisting for at least two weeks. She has a history of being treated with hydroxyzine , which she cannot take during the day due to sedation and lack of efficacy. She has previously been on Zoloft, which was effective, and has also tried Prozac. She has not been on any medication for the past year and reports a lack of excitement or happiness, stating 'nothing makes me happy except my  daughter.'  She recently had a urinary tract infection treated in the emergency department. Initially prescribed Augmentin, she suspected a reaction and was switched to amoxicillin . Despite this, she continues to experience UTI symptoms, including a vibrating sensation and tinnitus. A past workup involved a week-long urine collection to check for protein, which was negative. She has not completed the full course of antibiotics and continues to experience symptoms, including a vibrating sensation and tinnitus.  Her past medical history includes bipolar I disorder. She has been on various medications, including Zoloft, Abilify, and Seroquel, with varying degrees of success and side effects. Abilify made her feel 'very jacked up,' and Seroquel caused significant sedation and weight gain. She has a family history of bipolar disorder on her father's side, with several family members currently taking Lexapro  for anxiety and depression.  Socially, she is a Scientist, research (medical) and reports significant stress related to a recent scam involving her mother, resulting in a financial loss. She describes feeling 'jacked up' since the incident and has had to confront individuals attempting further scams. She does not currently drink alcohol regularly, although she did have a shot of liquor with her mother earlier in the day.      Past Medical History:  Diagnosis Date   Anxiety disorder    Arthritis    Back pain, chronic    Bipolar disorder (HCC)    Cellulitis    Cervicalgia    Chicken  pox    Chronic back pain    COPD (chronic obstructive pulmonary disease) (HCC)    Depression    Drug abuse (HCC)    GERD (gastroesophageal reflux disease)    Hx of migraines    Hyperlipidemia    Insomnia    Medical history non-contributory    Migraine    Nicotine  dependence    Seizures (HCC)    pt had 1 seizure October 2012- no other history of seizures   Suicide attempt (HCC) 01/02/2020   Vitamin D  deficiency      Outpatient Medications Prior to Visit  Medication Sig   [DISCONTINUED] amoxicillin -clavulanate (AUGMENTIN) 875-125 MG tablet Take 1 tablet by mouth 2 (two) times daily.   gabapentin  (NEURONTIN ) 800 MG tablet Take 1 tablet (800 mg total) by mouth daily.   [DISCONTINUED] cephALEXin  (KEFLEX ) 500 MG capsule Take 1 capsule (500 mg total) by mouth 2 (two) times daily.   [DISCONTINUED] famotidine  (PEPCID ) 20 MG tablet Take 1 tablet (20 mg total) by mouth 2 (two) times daily.   [DISCONTINUED] hydrOXYzine  (ATARAX ) 25 MG tablet Take 1 tablet (25 mg total) by mouth 3 (three) times daily as needed for anxiety.   [DISCONTINUED] mirtazapine  (REMERON ) 30 MG tablet Take 1 tablet (30 mg total) by mouth at bedtime.   [DISCONTINUED] sucralfate  (CARAFATE ) 1 g tablet Take 1 tablet (1 g total) by mouth 4 (four) times daily.   [DISCONTINUED] traMADol  (ULTRAM ) 50 MG tablet Take 1 tablet (50 mg total) by mouth every 6 (six) hours as needed.   [DISCONTINUED] traZODone  (DESYREL ) 100 MG tablet Take 1 tablet (100 mg total) by mouth at bedtime as needed for sleep.   No facility-administered medications prior to visit.    Past Surgical History:  Procedure Laterality Date   ABDOMINAL HYSTERECTOMY     BREAST SURGERY     CESAREAN SECTION     CHOLECYSTECTOMY     COLONOSCOPY N/A 12/27/2023   Procedure: COLONOSCOPY;  Surgeon: Erin Ole DASEN, MD;  Location: [facility] ENDOSCOPY;  Service: Endoscopy;  Laterality: N/A;   ESOPHAGOGASTRODUODENOSCOPY     ESOPHAGOGASTRODUODENOSCOPY N/A 12/27/2023   Procedure: EGD (ESOPHAGOGASTRODUODENOSCOPY);  Surgeon: Erin Ole DASEN, MD;  Location: [facility] ENDOSCOPY;  Service: Endoscopy;  Laterality: N/A;   ESOPHAGOGASTRODUODENOSCOPY (EGD) WITH PROPOFOL  N/A 09/23/2018   Procedure: ESOPHAGOGASTRODUODENOSCOPY (EGD) WITH PROPOFOL ;  Surgeon: Erin Corinn Skiff, MD;  Location: [facility] ENDOSCOPY;  Service: Gastroenterology;  Laterality: N/A;   Family Status  Relation Name Status   Mother  Alive    Father  Deceased at age 7       suicide    Sister  Alive   MGM  (Not Specified)   MGF  (Not Specified)   Erin [name]  (Not Specified)   Cousin  (Not Specified)  No partnership data on file   Family History  Problem Relation Age of Onset   Diabetes Mother    Depression Mother    Anxiety disorder Mother    Alcohol abuse Father    Asthma Father    COPD Father    Early death Father    Bipolar disorder Father    Heart failure Sister        from chemotherapy    Arthritis Maternal Grandmother    Stroke Maternal Grandmother    Pancreatic cancer Maternal Grandfather    Anxiety disorder Maternal Grandfather    Depression Maternal Grandfather    Bipolar disorder Paternal Uncle    Bipolar disorder Cousin    Social History   Socioeconomic History  Marital status: Divorced    Spouse name: Not on file   Number of children: Not on file   Years of education: Not on file   Highest education level: Not on file  Occupational History   Not on file  Tobacco Use   Smoking status: Every Day    Current packs/day: 0.50    Average packs/day: 0.5 packs/day for 25.0 years (12.5 ttl pk-yrs)    Types: Cigarettes   Smokeless tobacco: Never  Vaping Use   Vaping status: Never Used  Substance and Sexual Activity   Alcohol use: No    Alcohol/week: 0.0 standard drinks of alcohol    Comment: none for 3 yrs   Drug use: Yes    Types: Methamphetamines    Comment: 6 months ago   Sexual activity: Yes    Birth control/protection: None  Other Topics Concern   Not on file  Social History Narrative   Not on file   Social Drivers of Health   Financial Resource Strain: Medium Risk (02/04/2024)   Received from [facility]   Overall Financial Resource Strain (CARDIA)    Difficulty of Paying Living Expenses: Somewhat hard  Food Insecurity: No Food Insecurity (02/04/2024)   Received from [facility]   Hunger Vital Sign    Within the past 12 months, you worried that  your food would run out before you got the money to buy more.: Never true    Within the past 12 months, the food you bought just didn't last and you didn't have money to get more.: Never true  Recent Concern: Food Insecurity - Food Insecurity Present (12/19/2023)   Received from [facility]   Hunger Vital Sign    Worried About Running Out of Food in the Last Year: Sometimes true    Ran Out of Food in the Last Year: Never true  Transportation Needs: No Transportation Needs (02/04/2024)   Received from [facility] - Transportation    In the past 12 months, has lack of transportation kept you from medical appointments or from getting medications?: No    Lack of Transportation (Non-Medical): No  Physical Activity: Not on file  Stress: Stress Concern Present (04/14/2024)   [employer] - Occupational Stress Questionnaire    Feeling of Stress: To some extent  Social Connections: Not on file     Allergies  Allergen Reactions   Clonidine  Derivatives Other (See Comments)    The patch causes seizures   Paxil [Paroxetine Hcl]    Simvastatin    Sulfa Drugs Cross Reactors     Immunization History  Administered Date(s) Administered   PPD Test 05/21/2023   Tdap 01/02/2020    Health Maintenance  Topic Date Due   HIV Screening  Never done   Hepatitis C Screening  Never done   Pneumococcal Vaccine 32-51 Years old (1 of 2 - PCV) Never done   Hepatitis B Vaccines (1 of 3 - 19+ 3-dose series) Never done   Cervical Cancer Screening (HPV/Pap Cotest)  Never done   MAMMOGRAM  Never done   Zoster Vaccines- Shingrix (1 of 2) Never done   COVID-19 Vaccine (1 - 2024-25 season) Never done   INFLUENZA VACCINE  05/08/2024   DTaP/Tdap/Td (2 - Td or Tdap) 01/01/2030   Colonoscopy  12/26/2033   HPV VACCINES  Aged Out   Meningococcal B Vaccine  Aged Out    Patient Care Team: Simmons-Robinson,  Rockie, MD as PCP - General (Family  Medicine)  Review of Systems  Last CBC Lab Results  Component Value Date   WBC 6.5 04/14/2024   HGB 14.3 04/14/2024   HCT 44.4 04/14/2024   MCV 97 04/14/2024   MCH 31.1 04/14/2024   RDW 12.2 04/14/2024   PLT 330 04/14/2024   Last metabolic panel Lab Results  Component Value Date   GLUCOSE 99 04/14/2024   NA 142 04/14/2024   K 4.3 04/14/2024   CL 103 04/14/2024   CO2 20 04/14/2024   BUN 6 04/14/2024   CREATININE 0.65 04/14/2024   GFRNONAA >60 04/02/2024   CALCIUM 9.9 04/14/2024   PHOS 4.2 03/25/2009   PROT 7.5 04/14/2024   ALBUMIN 4.8 04/14/2024   LABGLOB 2.7 04/14/2024   BILITOT <0.2 04/14/2024   ALKPHOS 75 04/14/2024   AST 12 04/14/2024   ALT 11 04/14/2024   ANIONGAP 9 04/02/2024   Last lipids Lab Results  Component Value Date   CHOL 251 (H) 04/14/2024   HDL 46 04/14/2024   LDLCALC 149 (H) 04/14/2024   TRIG 302 (H) 04/14/2024   CHOLHDL 5.5 (H) 04/14/2024   Last hemoglobin A1c No results found for: HGBA1C Last thyroid  functions Lab Results  Component Value Date   TSH 0.299 (L) 04/14/2024   Last vitamin D  Lab Results  Component Value Date   VD25OH 28 (L) 03/27/2013   Last vitamin B12 and Folate Lab Results  Component Value Date   VITAMINB12 556 03/27/2009   FOLATE  03/27/2009    2.8 (NOTE)  Reference Ranges        Deficient:       0.4 - 3.3 ng/mL        Indeterminate:   3.4 - 5.4 ng/mL        Normal:              > 5.4 ng/mL        Objective    BP 113/78   Pulse (!) 103   Ht 5' 9 (1.753 m)   Wt 130 lb (59 kg)   SpO2 97%   BMI 19.20 kg/m  BP Readings from Last 3 Encounters:  04/14/24 113/78  04/02/24 102/76  12/27/23 111/80   Wt Readings from Last 3 Encounters:  04/14/24 130 lb (59 kg)  04/02/24 138 lb 14.2 oz (63 kg)  12/27/23 139 lb (63 kg)        Depression Screen    04/14/2024    3:43 PM 07/08/2017    3:22 PM  PHQ 2/9 Scores  PHQ - 2 Score 3 1  PHQ- 9 Score 12    Results for orders placed or performed in visit on  04/14/24  Urine Culture   Specimen: Urine   UR  Result Value Ref Range   Urine Culture, Routine Final report    Organism ID, Bacteria Comment   TSH+T4F+T3Free  Result Value Ref Range   TSH 0.299 (L) 0.450 - 4.500 uIU/mL   T3, Free 2.3 2.0 - 4.4 pg/mL   Free T4 1.09 0.82 - 1.77 ng/dL  CBC  Result Value Ref Range   WBC 6.5 3.4 - 10.8 x10E3/uL   RBC 4.60 3.77 - 5.28 x10E6/uL   Hemoglobin 14.3 11.1 - 15.9 g/dL   Hematocrit 55.5 65.9 - 46.6 %   MCV 97 79 - 97 fL   MCH 31.1 26.6 - 33.0 pg   MCHC 32.2 31.5 - 35.7 g/dL   RDW 87.7 88.2 - 84.5 %  Platelets 330 150 - 450 x10E3/uL  CMP14+EGFR  Result Value Ref Range   Glucose 99 70 - 99 mg/dL   BUN 6 6 - 24 mg/dL   Creatinine, Ser 9.34 0.57 - 1.00 mg/dL   eGFR 893 >40 fO/fpw/8.26   BUN/Creatinine Ratio 9 9 - 23   Sodium 142 134 - 144 mmol/L   Potassium 4.3 3.5 - 5.2 mmol/L   Chloride 103 96 - 106 mmol/L   CO2 20 20 - 29 mmol/L   Calcium 9.9 8.7 - 10.2 mg/dL   Total Protein 7.5 6.0 - 8.5 g/dL   Albumin 4.8 3.8 - 4.9 g/dL   Globulin, Total 2.7 1.5 - 4.5 g/dL   Bilirubin Total <9.7 0.0 - 1.2 mg/dL   Alkaline Phosphatase 75 44 - 121 IU/L   AST 12 0 - 40 IU/L   ALT 11 0 - 32 IU/L  Lipid panel  Result Value Ref Range   Cholesterol, Total 251 (H) 100 - 199 mg/dL   Triglycerides 697 (H) 0 - 149 mg/dL   HDL 46 >60 mg/dL   VLDL Cholesterol Cal 56 (H) 5 - 40 mg/dL   LDL Chol Calc (NIH) 850 (H) 0 - 99 mg/dL   Chol/HDL Ratio 5.5 (H) 0.0 - 4.4 ratio  POCT Urinalysis Dipstick  Result Value Ref Range   Color, UA yellow    Clarity, UA clear    Glucose, UA Negative Negative   Bilirubin, UA small    Ketones, UA neg    Spec Grav, UA 1.025 1.010 - 1.025   Blood, UA neg    pH, UA 6.0 5.0 - 8.0   Protein, UA Positive (A) Negative   Urobilinogen, UA 2.0 (A) 0.2 or 1.0 E.U./dL   Nitrite, UA positive    Leukocytes, UA Negative Negative   Appearance     Odor       Physical Exam Vitals reviewed.  Constitutional:      General: She is  not in acute distress.    Appearance: Normal appearance. She is not ill-appearing.  Cardiovascular:     Rate and Rhythm: Normal rate and regular rhythm.  Pulmonary:     Effort: Pulmonary effort is normal. No respiratory distress.     Breath sounds: No wheezing, rhonchi or rales.  Neurological:     Mental Status: She is alert and oriented to person, place, and time.  Psychiatric:        Mood and Affect: Mood is anxious.        Behavior: Behavior is hyperactive.        Thought Content: Thought content is not paranoid. Thought content does not include homicidal or suicidal ideation.    Physical Exam        Assessment & Plan      Problem List Items Addressed This Visit       Cardiovascular and Mediastinum   Migraines   Noted hx  Pt did not report symptoms today       Relevant Medications   escitalopram  (LEXAPRO ) 10 MG tablet     Respiratory   COPD (chronic obstructive pulmonary disease) (HCC)   No current medications for this problem  Continue to monitor for symptoms         Digestive   GERD (gastroesophageal reflux disease)   Chronic  No symptoms reported today       Relevant Orders   CBC (Completed)     Other   Tobacco use   Advise smoking cessation  Opiate abuse, episodic (HCC)   Relevant Orders   Pain Management Screening Profile (10S)   Nondependent alcohol abuse, continuous drinking behavior   Pt reports limiting alcohol consumption to single shot recently  Advised continued abstinence       Relevant Orders   Pain Management Screening Profile (10S)   Insomnia   Chronic  tSH collected today due to symptoms of being on edge       Relevant Orders   TSH+T4F+T3Free (Completed)   HLD (hyperlipidemia)   Chronic  Lipid panel ordered today       Relevant Orders   Lipid panel (Completed)   HCV antibody positive   Relevant Orders   CMP14+EGFR (Completed)   Dyslipidemia   Drug abuse, opioid type (HCC)   Chronic  Pt reports  abstinence from substances  Toxicology ordered today, pt voices interest in resuming Xanax prescription for anxiety management       Relevant Orders   Pain Management Screening Profile (10S)   Bipolar 1 disorder, depressed (HCC)   Chronic Uncontrolled Bipolar I Disorder with anxiety and depression symptoms. Previously on Zoloft, Prozac, Seroquel, and Abilify with varying success and side effects. Currently not on psychiatric medications and requires treatment to manage mood symptoms. Emphasized managing both depressive and manic symptoms to avoid exacerbation. Referral to psychiatry is necessary for specialized management.  Patient is hopeful to resume Xanax prescription, hesitant to start this medication given development of dependence and patient's history of substance abuse,I would prefer that this prescription if deemed appropriate be managed by psychiatry - Refer to psychiatry for specialized management - Prescribe Lexapro  10 mg daily - Order baseline labs including thyroid  function tests      Relevant Medications   escitalopram  (LEXAPRO ) 10 MG tablet   Other Relevant Orders   Ambulatory referral to Psychiatry   TSH+T4F+T3Free (Completed)   CBC (Completed)   Pain Management Screening Profile (10S)   Amphetamine abuse (HCC)   Noted hx  Pt has been rehabilitated, denies substance abuse  Toxicology panel ordered today       Relevant Orders   Pain Management Screening Profile (10S)   Alcohol abuse, daily use   Relevant Orders   Pain Management Screening Profile (10S)   Other Visit Diagnoses       Acute cystitis without hematuria    -  Primary   Relevant Medications   cephALEXin  (KEFLEX ) 500 MG capsule   Other Relevant Orders   Urine Culture (Completed)     Frequency of urination       Relevant Orders   POCT Urinalysis Dipstick (Completed)     Pain       Relevant Orders   POCT Urinalysis Dipstick (Completed)        Assessment & Plan Urinary Tract Infection  (UTI) Acute Ongoing UTI symptoms despite previous treatment with Augmentin and amoxicillin . Urinalysis shows positive nitrates. Previous urine culture showed Proteus mirabilis, resistant to nitrofurantoin  and Bactrim, sensitive to ciprofloxacin. Did not complete antibiotics due to ER advice and medication reaction concerns. Current symptoms may be exacerbated by medication reactions rather than the UTI itself. - Order urine culture - Prescribe Keflex  500 mg three times a day for one week - Discontinue amoxicillin  and Augmentin     Anxiety Significant anxiety symptoms, including a vibrating sensation and tinnitus, not attributed to usual anxiety experiences. Previous treatments with hydroxyzine  and buspirone were ineffective. Expresses need for immediate relief and inquires about benzodiazepines. Discussed potential dependency issues with benzodiazepines and need for psychiatric  input before initiation. Lexapro  chosen to address both anxiety and depression, with expected onset in 4-6 weeks. - Prescribe Lexapro  10 mg daily - Refer to psychiatry for further evaluation and management - Discuss potential risks of benzodiazepines and emphasize need for psychiatric input before initiation  General Health Maintenance Due for routine health screenings, including mammogram. Recently had colonoscopy and endoscopy. - Order mammogram - Check cholesterol levels  Follow-up Follow-up required to assess effectiveness of new treatment regimen and ensure proper management of psychiatric and UTI symptoms. - Schedule follow-up appointment in 2-3 weeks - Coordinate with psychiatry for appointment scheduling      Return in about 3 weeks (around 05/05/2024) for bipolar d/o .      Erin Agent, MD  [facility] [phone] (phone) [phone] (fax)  [facility]

## 2024-04-15 LAB — LIPID PANEL

## 2024-04-16 ENCOUNTER — Ambulatory Visit: Payer: Self-pay | Admitting: Family Medicine

## 2024-04-16 LAB — CBC
Hematocrit: 44.4 % (ref 34.0–46.6)
Hemoglobin: 14.3 g/dL (ref 11.1–15.9)
MCH: 31.1 pg (ref 26.6–33.0)
MCHC: 32.2 g/dL (ref 31.5–35.7)
MCV: 97 fL (ref 79–97)
Platelets: 330 x10E3/uL (ref 150–450)
RBC: 4.6 x10E6/uL (ref 3.77–5.28)
RDW: 12.2 % (ref 11.7–15.4)
WBC: 6.5 x10E3/uL (ref 3.4–10.8)

## 2024-04-16 LAB — CMP14+EGFR
ALT: 11 IU/L (ref 0–32)
AST: 12 IU/L (ref 0–40)
Albumin: 4.8 g/dL (ref 3.8–4.9)
Alkaline Phosphatase: 75 IU/L (ref 44–121)
BUN/Creatinine Ratio: 9 (ref 9–23)
BUN: 6 mg/dL (ref 6–24)
Bilirubin Total: <0.2 mg/dL (ref 0.0–1.2)
CO2: 20 mmol/L (ref 20–29)
Calcium: 9.9 mg/dL (ref 8.7–10.2)
Chloride: 103 mmol/L (ref 96–106)
Creatinine, Ser: 0.65 mg/dL (ref 0.57–1.00)
Globulin, Total: 2.7 g/dL (ref 1.5–4.5)
Glucose: 99 mg/dL (ref 70–99)
Potassium: 4.3 mmol/L (ref 3.5–5.2)
Sodium: 142 mmol/L (ref 134–144)
Total Protein: 7.5 g/dL (ref 6.0–8.5)
eGFR: 106 mL/min/1.73 (ref >59)

## 2024-04-16 LAB — TSH+T4F+T3FREE
Free T4: 1.09 ng/dL (ref 0.82–1.77)
T3, Free: 2.3 pg/mL (ref 2.0–4.4)
TSH: 0.299 u[IU]/mL — AB (ref 0.450–4.500)

## 2024-04-16 LAB — LIPID PANEL
Cholesterol, Total: 251 mg/dL — AB (ref 100–199)
HDL: 46 mg/dL (ref >39)
LDL CALC COMMENT:: 5.5 ratio — AB (ref 0.0–4.4)
LDL Chol Calc (NIH): 149 mg/dL — AB (ref 0–99)
Triglycerides: 302 mg/dL — AB (ref 0–149)
VLDL Cholesterol Cal: 56 mg/dL — AB (ref 5–40)

## 2024-04-16 NOTE — Telephone Encounter (Signed)
 Copied from CRM [id number]. Topic: Clinical - Lab/Test Results >> Apr 16, 2024 11:06 AM Ber[place] MATSU wrote: Reason for CRM: Patient called in requesting for MD  to call her and go over lab results with her in more detailed vs her symptoms. Per patient she knows that the nurse called in to speak to her but she is requesting to speak with Dr. Lang.   May you please assist.

## 2024-04-17 ENCOUNTER — Ambulatory Visit: Payer: Self-pay

## 2024-04-17 LAB — PMP SCREEN PROFILE (10S), URINE
Amphetamine Scrn, Ur: NEGATIVE ng/mL
BARBITURATE SCREEN URINE: NEGATIVE ng/mL
BENZODIAZEPINE SCREEN, URINE: NEGATIVE ng/mL
CANNABINOIDS UR QL SCN: NEGATIVE ng/mL
Cocaine (Metab) Scrn, Ur: NEGATIVE ng/mL
Creatinine(Crt), U: 98.5 mg/dL (ref 20.0–300.0)
Methadone Screen, Urine: NEGATIVE ng/mL
OXYCODONE+OXYMORPHONE UR QL SCN: POSITIVE ng/mL — AB
Opiate Scrn, Ur: NEGATIVE ng/mL
Ph of Urine: 5.9 (ref 4.5–8.9)
Phencyclidine Qn, Ur: NEGATIVE ng/mL
Propoxyphene Scrn, Ur: NEGATIVE ng/mL

## 2024-04-17 LAB — URINE CULTURE

## 2024-04-17 MED ORDER — ONDANSETRON 4 MG PO TBDP: 4.0000 mg | ORAL_TABLET | Freq: Three times a day (TID) | ORAL | 0 refills | Status: AC | PRN

## 2024-04-17 NOTE — Assessment & Plan Note (Signed)
 Pt reports limiting alcohol consumption to single shot recently  Advised continued abstinence

## 2024-04-17 NOTE — Assessment & Plan Note (Signed)
 Chronic  No symptoms reported today

## 2024-04-17 NOTE — Assessment & Plan Note (Signed)
 Noted hx  Pt has been rehabilitated, denies substance abuse  Toxicology panel ordered today

## 2024-04-17 NOTE — Telephone Encounter (Signed)
 Called, but was only able to leave a voice message asking her to contact the office back regarding the message per Dr R, if she returns the call ok to relay message from Dr JONELLE

## 2024-04-17 NOTE — Assessment & Plan Note (Signed)
 Chronic  Pt reports abstinence from substances  Toxicology ordered today, pt voices interest in resuming Xanax prescription for anxiety management

## 2024-04-17 NOTE — Telephone Encounter (Signed)
 Zofran  Rx sent to patient's pharmacy. If unable to tolerate oral fluids while taking nausea medication, I recommend ED evaluation for IV fluids and medication

## 2024-04-17 NOTE — Assessment & Plan Note (Signed)
 Chronic  tSH collected today due to symptoms of being on edge

## 2024-04-17 NOTE — Assessment & Plan Note (Signed)
Advise smoking cessation 

## 2024-04-17 NOTE — Telephone Encounter (Signed)
 FYI Only or Action Required?: FYI only for provider.  Patient was last seen in primary care on 04/14/2024 by Sharma Coyer, MD.  Called Nurse Triage reporting Emesis.  Symptoms began several weeks ago.  Interventions attempted: Nothing.  Symptoms are: gradually worsening.  Triage Disposition: No disposition on file.  Patient/caregiver understands and will follow disposition?:   To ER  Copied from CRM [id number]. Topic: Clinical - Red Word Triage >> Apr 17, 2024 11:15 AM Erin [name] wrote: Red Word that prompted transfer to Nurse Triage: The patient called stating she has not been able to eat and keep it down for days now. She says she is only drinking water and ginger ale due to her severe nausea. She is on Cipro for Uti and Zofran  for the nausea. She doesn't know what else to do stating I know I may end up in the ER but I don't know exactly what to tell them. I will transfer her to E2C2 NT Reason for Disposition  [1] Drinking very little AND [2] dehydration suspected (e.g., no urine > 12 hours, very dry mouth, very lightheaded)  Answer Assessment - Initial Assessment Questions 1. VOMITING SEVERITY: How many times have you vomited in the past 24 hours?      once 2. ONSET: When did the vomiting begin?      Four days ago 3. FLUIDS: What fluids or food have you vomited up today? Have you been able to keep any fluids down?     Water and ginger ale 4. ABDOMEN PAIN: Are your having any abdomen pain? If Yes : How bad is it and what does it feel like? (e.g., crampy, dull, intermittent, constant)      A little bit last night 5. DIARRHEA: Is there any diarrhea? If Yes, ask: How many times today?      denies 6. CONTACTS: Is there anyone else in the family with the same symptoms?      no 7. CAUSE: What do you think is causing your vomiting?     unknown 8. HYDRATION STATUS: Any signs of dehydration? (e.g., dry mouth [not only dry lips], too weak to stand) When did  you last urinate?     Yes, states laying in bed for four days 9. OTHER SYMPTOMS: Do you have any other symptoms? (e.g., fever, headache, vertigo, vomiting blood or coffee grounds, recent head injury)     States can't keep anything down 10. PREGNANCY: Is there any chance you are pregnant? When was your last menstrual period?       na  Protocols used: Vomiting-A-AH

## 2024-04-17 NOTE — Telephone Encounter (Signed)
 Please advise patient to sign up for MyChart which will help with reviewing results and explanations for follow up questions. It is difficult for me to personally call patients in order to review labs and answer questions given clinic responsibilities and time constraints with appointments throughout the day.   Please review results note with patient and advise her to follow up for thyroid  testing as previously recommended to help understand more of why her levels may be abnormal.

## 2024-04-17 NOTE — Telephone Encounter (Signed)
 Reviewed agree with urgent assessment

## 2024-04-17 NOTE — Assessment & Plan Note (Signed)
 No current medications for this problem  Continue to monitor for symptoms

## 2024-04-17 NOTE — Assessment & Plan Note (Signed)
 Noted hx  Pt did not report symptoms today

## 2024-04-17 NOTE — Assessment & Plan Note (Signed)
 Chronic  Lipid panel ordered today

## 2024-04-17 NOTE — Assessment & Plan Note (Signed)
 Chronic Uncontrolled Bipolar I Disorder with anxiety and depression symptoms. Previously on Zoloft, Prozac, Seroquel, and Abilify with varying success and side effects. Currently not on psychiatric medications and requires treatment to manage mood symptoms. Emphasized managing both depressive and manic symptoms to avoid exacerbation. Referral to psychiatry is necessary for specialized management.  Patient is hopeful to resume Xanax prescription, hesitant to start this medication given development of dependence and patient's history of substance abuse,I would prefer that this prescription if deemed appropriate be managed by psychiatry - Refer to psychiatry for specialized management - Prescribe Lexapro  10 mg daily - Order baseline labs including thyroid  function tests

## 2024-04-17 NOTE — Telephone Encounter (Signed)
 FYI Only or Action Required?: Action required by provider: update on patient condition.  Patient was last seen in primary care on 04/14/2024 by Sharma Coyer, MD.  Called Nurse Triage reporting Nausea.  Symptoms began a week ago.  Interventions attempted: Prescription medications: Cipro, Zofran  (mother's Rx - provides relief for 4 hours).  Symptoms are: unchanged.  Triage Disposition: Call PCP Within 24 Hours  Patient/caregiver understands and will follow disposition?: Yes    Copied from CRM [id number]. Topic: Clinical - Red Word Triage >> Apr 17, 2024  9:18 AM Turkey A wrote: [facility] that prompted transfer to Nurse Triage: Patient is not able to eat due to being very nauseas. Patient is currently on Cipro for UTI but not feeling any relief. Patient thinks it could also be her thyroid  Reason for Disposition  Taking prescription medication that could cause nausea (e.g., narcotics/opiates, antibiotics, OCPs, many others)  Answer Assessment - Initial Assessment Questions 1. NAUSEA SEVERITY: How bad is the nausea? (e.g., mild, moderate, severe; dehydration, weight loss)     It's bad I've been taking my mother's zofran  with relief - triager endorses to not take another person's Rx 2. ONSET: When did the nausea begin?     X 1 week 3. VOMITING: Any vomiting? If Yes, ask: How many times today?     Yes, x 1 - reports threw up medication  4. RECURRENT SYMPTOM: Have you had nausea before? If Yes, ask: When was the last time? What happened that time?     Yes, but I don't know it's what it is 5. CAUSE: What do you think is causing the nausea?     Unknown if UTI related or abx 6. PREGNANCY: Is there any chance you are pregnant? (e.g., unprotected intercourse, missed birth control pill, broken condom)     denies    Pt recently seen by PCP a few days ago and dx with UTI, now on abx. Pt reports that nausea started a few days before UTI sx and is unable to  tolerate abx/eat. Triager will forward encounter for Dr. Sharma 's office to review and advise. Patient verbalized understanding and is expecting call back from office for next steps. Triager also advised that if pt does not hear back from office, to follow disposition for further evaluation/treatment.  Answer Assessment - Initial Assessment Questions 1. VOMITING SEVERITY: How many times have you vomited in the past 24 hours?      X 1 2. ONSET: When did the vomiting begin?      X 1 day 3. FLUIDS: What fluids or food have you vomited up today? Have you been able to keep any fluids down?     yes 4. ABDOMEN PAIN: Are your having any abdomen pain? If Yes : How bad is it and what does it feel like? (e.g., crampy, dull, intermittent, constant)      Not really 5. DIARRHEA: Is there any diarrhea? If Yes, ask: How many times today?      denies 6. CONTACTS: Is there anyone else in the family with the same symptoms?      denies 7. CAUSE: What do you think is causing your vomiting?     unknown 8. HYDRATION STATUS: Any signs of dehydration? (e.g., dry mouth [not only dry lips], too weak to stand) When did you last urinate?     Denies UOP x 2 since this AM 9. OTHER SYMPTOMS: Do you have any other symptoms? (e.g., fever, headache, vertigo, vomiting blood or coffee grounds,  recent head injury)     denies 10. PREGNANCY: Is there any chance you are pregnant? When was your last menstrual period?       N/a  Protocols used: Nausea-A-AH, Vomiting-A-AH

## 2024-04-18 ENCOUNTER — Emergency Department: Payer: MEDICAID

## 2024-04-18 ENCOUNTER — Observation Stay
Admission: EM | Admit: 2024-04-18 | Discharge: 2024-04-20 | Disposition: A | Discharge status: Home / Self Care | Payer: MEDICAID | Attending: Internal Medicine | Admitting: Internal Medicine

## 2024-04-18 ENCOUNTER — Other Ambulatory Visit: Payer: Self-pay

## 2024-04-18 DIAGNOSIS — F1590 Other stimulant use, unspecified, uncomplicated: Secondary | ICD-10-CM | POA: Diagnosis not present

## 2024-04-18 DIAGNOSIS — E785 Hyperlipidemia, unspecified: Secondary | ICD-10-CM | POA: Diagnosis not present

## 2024-04-18 DIAGNOSIS — R112 Nausea with vomiting, unspecified: Principal | ICD-10-CM | POA: Diagnosis present

## 2024-04-18 DIAGNOSIS — J449 Chronic obstructive pulmonary disease, unspecified: Secondary | ICD-10-CM | POA: Diagnosis not present

## 2024-04-18 DIAGNOSIS — F418 Other specified anxiety disorders: Secondary | ICD-10-CM | POA: Diagnosis not present

## 2024-04-18 DIAGNOSIS — G4700 Insomnia, unspecified: Secondary | ICD-10-CM | POA: Diagnosis present

## 2024-04-18 DIAGNOSIS — F17220 Nicotine dependence, chewing tobacco, uncomplicated: Secondary | ICD-10-CM | POA: Diagnosis not present

## 2024-04-18 DIAGNOSIS — K219 Gastro-esophageal reflux disease without esophagitis: Secondary | ICD-10-CM | POA: Diagnosis not present

## 2024-04-18 DIAGNOSIS — M791 Myalgia, unspecified site: Secondary | ICD-10-CM | POA: Insufficient documentation

## 2024-04-18 DIAGNOSIS — F329 Major depressive disorder, single episode, unspecified: Secondary | ICD-10-CM | POA: Diagnosis not present

## 2024-04-18 DIAGNOSIS — F419 Anxiety disorder, unspecified: Secondary | ICD-10-CM | POA: Diagnosis present

## 2024-04-18 DIAGNOSIS — F1914 Other psychoactive substance abuse with psychoactive substance-induced mood disorder: Secondary | ICD-10-CM | POA: Diagnosis not present

## 2024-04-18 DIAGNOSIS — Z87440 Personal history of urinary (tract) infections: Secondary | ICD-10-CM | POA: Diagnosis not present

## 2024-04-18 DIAGNOSIS — Z720 Tobacco use: Secondary | ICD-10-CM | POA: Diagnosis present

## 2024-04-18 DIAGNOSIS — F32A Depression, unspecified: Secondary | ICD-10-CM | POA: Diagnosis present

## 2024-04-18 DIAGNOSIS — N3000 Acute cystitis without hematuria: Secondary | ICD-10-CM | POA: Insufficient documentation

## 2024-04-18 DIAGNOSIS — R1030 Lower abdominal pain, unspecified: Secondary | ICD-10-CM | POA: Diagnosis not present

## 2024-04-18 DIAGNOSIS — F17210 Nicotine dependence, cigarettes, uncomplicated: Secondary | ICD-10-CM | POA: Insufficient documentation

## 2024-04-18 DIAGNOSIS — R10819 Abdominal tenderness, unspecified site: Secondary | ICD-10-CM | POA: Insufficient documentation

## 2024-04-18 DIAGNOSIS — F410 Panic disorder [episodic paroxysmal anxiety] without agoraphobia: Secondary | ICD-10-CM | POA: Diagnosis present

## 2024-04-18 DIAGNOSIS — F1994 Other psychoactive substance use, unspecified with psychoactive substance-induced mood disorder: Secondary | ICD-10-CM | POA: Diagnosis present

## 2024-04-18 DIAGNOSIS — R52 Pain, unspecified: Secondary | ICD-10-CM | POA: Insufficient documentation

## 2024-04-18 LAB — URINE DRUG SCREEN, QUALITATIVE (ARMC ONLY)
Amphetamines, Ur Screen: NOT DETECTED
Barbiturates, Ur Screen: NOT DETECTED
Benzodiazepine, Ur Scrn: NOT DETECTED
Cannabinoid 50 Ng, Ur ~~LOC~~: NOT DETECTED
Cocaine Metabolite,Ur ~~LOC~~: NOT DETECTED
MDMA (Ecstasy)Ur Screen: NOT DETECTED
Methadone Scn, Ur: NOT DETECTED
Opiate, Ur Screen: NOT DETECTED
Phencyclidine (PCP) Ur S: NOT DETECTED
Tricyclic, Ur Screen: NOT DETECTED

## 2024-04-18 LAB — SARS CORONAVIRUS 2 BY RT PCR: SARS Coronavirus 2 by RT PCR: NEGATIVE

## 2024-04-18 LAB — CBC
HCT: 45.3 % (ref 36.0–46.0)
Hemoglobin: 15.5 g/dL — ABNORMAL HIGH (ref 12.0–15.0)
MCH: 30.8 pg (ref 26.0–34.0)
MCHC: 34.2 g/dL (ref 30.0–36.0)
MCV: 90.1 fL (ref 80.0–100.0)
Platelets: 352 K/uL (ref 150–400)
RBC: 5.03 MIL/uL (ref 3.87–5.11)
RDW: 11.7 % (ref 11.5–15.5)
WBC: 8 K/uL (ref 4.0–10.5)
nRBC: 0 % (ref 0.0–0.2)

## 2024-04-18 LAB — URINALYSIS, ROUTINE W REFLEX MICROSCOPIC
Bilirubin Urine: NEGATIVE
Glucose, UA: NEGATIVE mg/dL
Hgb urine dipstick: NEGATIVE
Ketones, ur: 5 mg/dL — AB
Leukocytes,Ua: NEGATIVE
Nitrite: NEGATIVE
Protein, ur: NEGATIVE mg/dL
Specific Gravity, Urine: 1.015 (ref 1.005–1.030)
pH: 6 (ref 5.0–8.0)

## 2024-04-18 LAB — CK: Total CK: 32 U/L — ABNORMAL LOW (ref 38–234)

## 2024-04-18 LAB — COMPREHENSIVE METABOLIC PANEL WITH GFR
ALT: 105 U/L — ABNORMAL HIGH (ref 0–44)
AST: 43 U/L — ABNORMAL HIGH (ref 15–41)
Albumin: 4.6 g/dL (ref 3.5–5.0)
Alkaline Phosphatase: 95 U/L (ref 38–126)
Anion gap: 10 (ref 5–15)
BUN: 11 mg/dL (ref 6–20)
CO2: 28 mmol/L (ref 22–32)
Calcium: 10 mg/dL (ref 8.9–10.3)
Chloride: 99 mmol/L (ref 98–111)
Creatinine, Ser: 0.73 mg/dL (ref 0.44–1.00)
GFR, Estimated: >60 mL/min (ref >60)
Glucose, Bld: 133 mg/dL — ABNORMAL HIGH (ref 70–99)
Potassium: 4 mmol/L (ref 3.5–5.1)
Sodium: 137 mmol/L (ref 135–145)
Total Bilirubin: 0.7 mg/dL (ref 0.0–1.2)
Total Protein: 7.9 g/dL (ref 6.5–8.1)

## 2024-04-18 LAB — TROPONIN I (HIGH SENSITIVITY): Troponin I (High Sensitivity): 3 ng/L (ref <18)

## 2024-04-18 LAB — T4, FREE: Free T4: 0.77 ng/dL (ref 0.61–1.12)

## 2024-04-18 LAB — HIV ANTIBODY (ROUTINE TESTING W REFLEX): HIV Screen 4th Generation wRfx: NONREACTIVE

## 2024-04-18 LAB — LIPASE, BLOOD: Lipase: 31 U/L (ref 11–51)

## 2024-04-18 LAB — TSH: TSH: 0.299 u[IU]/mL — ABNORMAL LOW (ref 0.350–4.500)

## 2024-04-18 MED ORDER — HEPARIN SODIUM (PORCINE) 5000 UNIT/ML IJ SOLN
5000.0000 [IU] | Freq: Three times a day (TID) | INTRAMUSCULAR | Status: DC
  Filled 2024-04-18: qty 1

## 2024-04-18 MED ORDER — SODIUM CHLORIDE 0.9 % IV BOLUS
1000.0000 mL | Freq: Once | INTRAVENOUS | Status: AC
  Administered 2024-04-18: 1000 mL via INTRAVENOUS

## 2024-04-18 MED ORDER — ESCITALOPRAM OXALATE 10 MG PO TABS
10.0000 mg | ORAL_TABLET | Freq: Every day | ORAL | Status: DC
  Administered 2024-04-19 – 2024-04-20 (×2): 10 mg via ORAL
  Filled 2024-04-18 (×2): qty 1

## 2024-04-18 MED ORDER — LORAZEPAM 1 MG PO TABS
1.0000 mg | ORAL_TABLET | Freq: Four times a day (QID) | ORAL | Status: AC | PRN
  Administered 2024-04-18 (×2): 1 mg via ORAL
  Filled 2024-04-18 (×2): qty 1

## 2024-04-18 MED ORDER — IOHEXOL 300 MG/ML  SOLN
100.0000 mL | Freq: Once | INTRAMUSCULAR | Status: AC | PRN
  Administered 2024-04-18: 100 mL via INTRAVENOUS

## 2024-04-18 MED ORDER — ONDANSETRON HCL 4 MG PO TABS
4.0000 mg | ORAL_TABLET | Freq: Four times a day (QID) | ORAL | Status: DC | PRN
  Administered 2024-04-18 – 2024-04-19 (×2): 4 mg via ORAL
  Filled 2024-04-18 (×2): qty 1

## 2024-04-18 MED ORDER — NICOTINE 14 MG/24HR TD PT24: 14.0000 mg | MEDICATED_PATCH | Freq: Every day | TRANSDERMAL | Status: DC | PRN

## 2024-04-18 MED ORDER — MELATONIN 5 MG PO TABS
5.0000 mg | ORAL_TABLET | Freq: Every evening | ORAL | Status: DC | PRN
  Administered 2024-04-18: 5 mg via ORAL
  Filled 2024-04-18: qty 1

## 2024-04-18 MED ORDER — KETOROLAC TROMETHAMINE 15 MG/ML IJ SOLN
15.0000 mg | Freq: Four times a day (QID) | INTRAMUSCULAR | Status: AC | PRN
  Administered 2024-04-18: 15 mg via INTRAVENOUS
  Filled 2024-04-18: qty 1

## 2024-04-18 MED ORDER — IPRATROPIUM-ALBUTEROL 0.5-2.5 (3) MG/3ML IN SOLN: 3.0000 mL | Freq: Four times a day (QID) | RESPIRATORY_TRACT | Status: DC | PRN

## 2024-04-18 MED ORDER — ACETAMINOPHEN 325 MG PO TABS
650.0000 mg | ORAL_TABLET | Freq: Four times a day (QID) | ORAL | Status: DC | PRN
  Administered 2024-04-18 – 2024-04-20 (×3): 650 mg via ORAL
  Filled 2024-04-18 (×3): qty 2

## 2024-04-18 MED ORDER — KETOROLAC TROMETHAMINE 15 MG/ML IJ SOLN
15.0000 mg | Freq: Once | INTRAMUSCULAR | Status: AC
  Administered 2024-04-18: 15 mg via INTRAVENOUS
  Filled 2024-04-18: qty 1

## 2024-04-18 MED ORDER — NICOTINE 21 MG/24HR TD PT24: 21.0000 mg | MEDICATED_PATCH | Freq: Every day | TRANSDERMAL | Status: DC | PRN

## 2024-04-18 MED ORDER — ONDANSETRON HCL 4 MG/2ML IJ SOLN
4.0000 mg | Freq: Once | INTRAMUSCULAR | Status: AC
  Administered 2024-04-18: 4 mg via INTRAVENOUS
  Filled 2024-04-18: qty 2

## 2024-04-18 MED ORDER — ACETAMINOPHEN 650 MG RE SUPP: 650.0000 mg | Freq: Four times a day (QID) | RECTAL | Status: DC | PRN

## 2024-04-18 MED ORDER — LORAZEPAM 2 MG/ML IJ SOLN
0.5000 mg | Freq: Once | INTRAMUSCULAR | Status: AC
  Administered 2024-04-18: 0.5 mg via INTRAVENOUS
  Filled 2024-04-18: qty 1

## 2024-04-18 MED ORDER — SODIUM CHLORIDE 0.9 % IV SOLN: INTRAVENOUS | Status: AC

## 2024-04-18 MED ORDER — PANTOPRAZOLE SODIUM 40 MG PO TBEC
40.0000 mg | DELAYED_RELEASE_TABLET | Freq: Two times a day (BID) | ORAL | Status: AC
  Administered 2024-04-18 – 2024-04-19 (×2): 40 mg via ORAL
  Filled 2024-04-18 (×2): qty 1

## 2024-04-18 MED ORDER — SENNOSIDES-DOCUSATE SODIUM 8.6-50 MG PO TABS: 1.0000 | ORAL_TABLET | Freq: Every evening | ORAL | Status: DC | PRN

## 2024-04-18 MED ORDER — LORAZEPAM 0.5 MG PO TABS: 0.5000 mg | ORAL_TABLET | Freq: Four times a day (QID) | ORAL | Status: DC | PRN

## 2024-04-18 MED ORDER — SODIUM CHLORIDE 0.9 % IV SOLN
1.0000 g | Freq: Once | INTRAVENOUS | Status: AC
  Administered 2024-04-18: 1 g via INTRAVENOUS
  Filled 2024-04-18: qty 10

## 2024-04-18 MED ORDER — ONDANSETRON HCL 4 MG/2ML IJ SOLN: 4.0000 mg | Freq: Four times a day (QID) | INTRAMUSCULAR | Status: DC | PRN

## 2024-04-18 MED ORDER — LORAZEPAM 2 MG/ML IJ SOLN
1.0000 mg | Freq: Once | INTRAMUSCULAR | Status: AC
  Administered 2024-04-18: 1 mg via INTRAVENOUS
  Filled 2024-04-18: qty 1

## 2024-04-18 MED ORDER — PROMETHAZINE HCL 25 MG PO TABS
12.5000 mg | ORAL_TABLET | Freq: Four times a day (QID) | ORAL | Status: AC | PRN
  Administered 2024-04-19: 12.5 mg via ORAL
  Filled 2024-04-18 (×2): qty 1

## 2024-04-18 NOTE — Assessment & Plan Note (Signed)
 Escitalopram  10 mg daily resumed Lorazepam  1 mg p.o. every 6 hours as needed for anxiety, 2 doses ordered

## 2024-04-18 NOTE — ED Notes (Signed)
 Pt stating she has been taking zofran  at home which has stopped working for her. Advised pt we can try it this time and if it does not help we could try something different.

## 2024-04-18 NOTE — Assessment & Plan Note (Addendum)
 UA was negative for leukocytes and nitrites.  The appearance was yellow and clear in color with appropriate specific gravity. Patient has taken 4 days of Keflex  500 mg 3 times daily that was prescribed on 04/14/2024 and patient received 1 dose of ceftriaxone  in the ED today I do not believe patient would benefit from further antibiotics at this time therefore I have not resumed antibiotics on admission  Counseling: Advised that patient should wipe from the front to the back after urination and bowel movements.  If patient should have sexual intercourse to urinate after sexual intercourse.  And patient is to stop using soap or personal hygiene products to wipe/clean her labia, vaginal area.  The use of soap even if it is not non-antibacterial would prevent the growth of healthy flora that is part of the human body's natural defense against infection including UTI.  Counseled patient on trying to take pre and post biotic that is encapsulated so that it would travel to her colon.  Personal hygiene especially cleaning the labia and vaginal area should be done with pain water and can be lukewarm.  Avoid using excessively hot water.  Patient endorses understanding and compliance.

## 2024-04-18 NOTE — ED Notes (Signed)
 Pt taken to CT.

## 2024-04-18 NOTE — ED Triage Notes (Signed)
 Pt to ED for nausea since 5 days. Some vomiting last 2 mornings (twice each day). Pt has been getting UTI tx with 3 different abx for past month. Saw PCP on 7/8. States has also had recurrent anxiety.

## 2024-04-18 NOTE — Assessment & Plan Note (Addendum)
 As nicotine  patch ordered Counseling at bedside

## 2024-04-18 NOTE — Assessment & Plan Note (Signed)
 Patient appears calm at this time Lorazepam  1 mg p.o. every 6 hours.  For anxiety, 2 doses ordered

## 2024-04-18 NOTE — Assessment & Plan Note (Signed)
 -  Melatonin 5 mg nightly as needed for sleep

## 2024-04-18 NOTE — ED Notes (Signed)
 Pt given cup of water for PO challenge. Pt stating If I could get another round of ativan  for the anxiety and nausea I think I might be able to eat.

## 2024-04-18 NOTE — ED Provider Notes (Signed)
 [facility] Provider Note    Event Date/Time   First MD Initiated Contact with Patient 04/18/24 [phone]     (approximate)   History   Nausea and Recurrent UTI   HPI  Erin [name] is a 53 y.o. female who comes in with nausea over the past 5 days with occasional vomiting.  Patient is currently on antibiotics for UTI.  Patient does report recurrent anxiety as well.  Patient reports previously being on Augmentin and then switching to amoxicillin  and now currently being on Keflex .  She reports taking the antibiotics but having continued nausea, vomiting.  She reports jitteriness all over.  She states that she just does not know if she can keep taking it.  She reports that she has been on the antibiotics for some issues with dysuria.  Patient does report feeling more anxious lately.  Denies any SI, HI.  She denies any chest pain, shortness of breath.  She does report some occasional lower abdominal pain associated with this.  Patient reports that she has Zofran  at home that she has been taking it like candy   Physical Exam   Triage Vital Signs: ED Triage Vitals  Encounter Vitals Group     BP 04/18/24 0852 126/80     Girls Systolic BP Percentile --      Girls Diastolic BP Percentile --      Boys Systolic BP Percentile --      Boys Diastolic BP Percentile --      Pulse Rate 04/18/24 0852 (!) 128     Resp 04/18/24 0852 20     Temp 04/18/24 0855 98.3 F (36.8 C)     Temp Source 04/18/24 0855 Oral     SpO2 04/18/24 0852 100 %     Weight 04/18/24 0854 130 lb (59 kg)     Height 04/18/24 0854 5' 9 (1.753 m)     Head Circumference --      Peak Flow --      Pain Score --      Pain Loc --      Pain Education --      Exclude from Growth Chart --     Most recent vital signs: Vitals:   04/18/24 0852 04/18/24 0855  BP: 126/80   Pulse: (!) 128   Resp: 20   Temp:  98.3 F (36.8 C)  SpO2: 100%      General: Awake, no distress.  CV:  Good peripheral  perfusion.  Resp:  Normal effort.  Abd:  No distention.  Soft and nontender. Other:  No swelling in legs.  No calf tenderness   ED Results / Procedures / Treatments   Labs (all labs ordered are listed, but only abnormal results are displayed) Labs Reviewed  COMPREHENSIVE METABOLIC PANEL WITH GFR - Abnormal; Notable for the following components:      Result Value   Glucose, Bld 133 (*)    AST 43 (*)    ALT 105 (*)    All other components within normal limits  CBC - Abnormal; Notable for the following components:   Hemoglobin 15.5 (*)    All other components within normal limits  URINALYSIS, ROUTINE W REFLEX MICROSCOPIC - Abnormal; Notable for the following components:   Color, Urine YELLOW (*)    APPearance CLEAR (*)    Ketones, ur 5 (*)    All other components within normal limits  LIPASE, BLOOD     EKG  My interpretation of  EKG:  Normal sinus rhythm 94 without any ST elevation or T wave inversions, normal intervals.  RADIOLOGY I have reviewed the CT personally and interpreted no evidence of any kidney stones   PROCEDURES:  Critical Care performed: No  Procedures   MEDICATIONS ORDERED IN ED: Medications  cefTRIAXone  (ROCEPHIN ) 1 g in sodium chloride  0.9 % 100 mL IVPB (1 g Intravenous New Bag/Given 04/18/24 1248)  sodium chloride  0.9 % bolus 1,000 mL (0 mLs Intravenous Stopped 04/18/24 1209)  LORazepam  (ATIVAN ) injection 0.5 mg (0.5 mg Intravenous Given 04/18/24 1000)  ketorolac  (TORADOL ) 15 MG/ML injection 15 mg (15 mg Intravenous Given 04/18/24 1000)  iohexol  (OMNIPAQUE ) 300 MG/ML solution 100 mL (100 mLs Intravenous Contrast Given 04/18/24 1034)  LORazepam  (ATIVAN ) injection 1 mg (1 mg Intravenous Given 04/18/24 1055)  ondansetron  (ZOFRAN ) injection 4 mg (4 mg Intravenous Given 04/18/24 1245)     IMPRESSION / MDM / ASSESSMENT AND PLAN / ED COURSE  I reviewed the triage vital signs and the nursing notes.   Patient's presentation is most consistent with acute  presentation with potential threat to life or bodily function.   Patient comes in with intractable nausea, vomiting the setting of treatment for UTI.  Will get CT imaging to ensure no kidney stones, pyelonephritis, abscess or other acute pathology will recheck of blood work.  She denies any chest pain, shortness of breath.  Patient does seem a little bit anxious.  She reports that Zofran  has not helped with nausea and vomiting at home.  While waiting EKG to evaluate for QTc given she reports taking more than the prescribed amount of Zofran  will start up with Ativan  IV that help with some of the anxiety as well as some of the nausea, vomiting.  UA without evidence of UTI does show some ketones concerning for some dehydration.  Lipase normal CMP shows some slight elevation of LFTs.  CBC is reassuring.  Thyroid  testing is reassuring  EKG does show normal QTc will trial some Zofran .  Patient is drinking some water.  Recommend trialing to take her Keflex  but patient declined stating she still felt very nauseous.  Explained to patient that her workup so far was reassuring and that we could try discharging patient with some Ativan  at home to help take with her Keflex .  We also discussed reduced dosing of Keflex  to 500 twice daily as this would adequately treat UTI as well.  Patient states that she does not feel comfortable going home and that she would prefer admission to the hospital.  Explained to patient that this would be based upon just her symptoms and that her CT scan and blood work is reassuring.  I explained to patient that this could be an observation stay that was not covered by her insurance and she expressed understanding but did not want to go home and did not want to try outpatient Ativan  to help with getting her Keflex  in.  Will discuss the hospitalist for evaluation for admission.    The patient is on the cardiac monitor to evaluate for evidence of arrhythmia and/or significant heart rate  changes.      FINAL CLINICAL IMPRESSION(S) / ED DIAGNOSES   Final diagnoses:  Intractable nausea and vomiting  Acute cystitis without hematuria     Rx / DC Orders   ED Discharge Orders     None        Note:  This document was prepared using Dragon voice recognition software and may include unintentional dictation errors.  Ernest Ronal BRAVO, MD 04/18/24 1254

## 2024-04-18 NOTE — Plan of Care (Signed)

## 2024-04-18 NOTE — Assessment & Plan Note (Signed)
 Not in acute exacerbation at this time Counseling on cessation of tobacco products DuoNebs every 6 hours as needed for shortness of breath, wheezing, 3 days ordered

## 2024-04-18 NOTE — ED Notes (Signed)
 Pt stating she has not been able to eat for the last 5 days, has been feeling nauseated and when she tried to force herself to eat she had episodes of emesis while taking her prescribed abx. Also stating has been out of work for the last week and hasn't showered since stating has had a lot of anxiety and unsure whether it is the abx or anxiety making her feel this way.

## 2024-04-18 NOTE — Assessment & Plan Note (Addendum)
 With nausea and vomiting for 1 week Check CK, respiratory 20 pathogen panel CK was not elevated

## 2024-04-18 NOTE — Assessment & Plan Note (Signed)
 Unclear etiology at this time No pain response with mild suprapubic palpation and general abdominal palpation at bedside May be related to patient not eating for about 1 week Toradol  15 mg IV every 6 hours as needed for moderate pain, 1 day ordered

## 2024-04-18 NOTE — Assessment & Plan Note (Signed)
 Symptomatic support, query gastroenteritis from viral infection Continue sodium chloride  infusion at 100 mL/h Ondansetron  4 mg p.o./IV as needed for nausea and vomiting, 5 days ordered; Phenergan  12.5 mg p.o. as needed for refractory nausea and vomiting, 1 day ordered Check BMP in a.m.

## 2024-04-18 NOTE — Hospital Course (Signed)
 Ms. Erin [name] is a 53 year old female with history of anxiety with panic attacks, neuropathy, bipolar, COPD, gastric reflux, migraines, presents emergency department for chief concerns of abdominal pain, nausea and vomiting for the last 5 days.  Vitals in the ED showed T of 98.3, RR 20, heart rate 128, blood pressure 126/80, SpO2 100% on room air.  Serum sodium is 137, potassium 4.0, chloride 99, bicarb 28, BUN 11, serum creatinine 0.73, EGFR greater than 60, nonfasting blood glucose 133, WBC 8.0, hemoglobin 15.5, platelets of 352.  TSH level was low and free T4 was within normal limits.  UA was negative for leukocytes and nitrates.  CT abdomen pelvis was negative.  ED treatment: Toradol  50 mg IV one-time dose, Ativan  0.5 mg IV one-time dose, Ativan  1 mg IV one-time dose, ondansetron  4 mg IV, sodium chloride  1 L bolus, ceftriaxone  1 g IV.

## 2024-04-18 NOTE — ED Notes (Signed)
 Pt stating has been able to take a few sips of water so far. No emesis at this time.

## 2024-04-18 NOTE — H&P (Addendum)
 History and Physical   Erin [name] FMW:[id number] DOB: [birth year] DOA: 04/18/2024  PCP: Sharma Coyer, MD Patient coming from: Home  I have personally briefly reviewed patient's old medical records in [facility] EMR.  Chief Concern: Nausea, vomiting  HPI: Ms. Erin [name] is a 53 year old female with history of anxiety with panic attacks, neuropathy, bipolar, COPD, gastric reflux, migraines, presents emergency department for chief concerns of abdominal pain, nausea and vomiting for the last 5 days.  Vitals in the ED showed T of 98.3, RR 20, heart rate 128, blood pressure 126/80, SpO2 100% on room air.  Serum sodium is 137, potassium 4.0, chloride 99, bicarb 28, BUN 11, serum creatinine 0.73, EGFR greater than 60, nonfasting blood glucose 133, WBC 8.0, hemoglobin 15.5, platelets of 352.  TSH level was low and free T4 was within normal limits.  UA was negative for leukocytes and nitrates.  CT abdomen pelvis was negative.  ED treatment: Toradol  50 mg IV one-time dose, Ativan  0.5 mg IV one-time dose, Ativan  1 mg IV one-time dose, ondansetron  4 mg IV, sodium chloride  1 L bolus, ceftriaxone  1 g IV. ---------------------------------------- At bedside, patient able to tell me her first and last name, her age, location, current calendar year.  She reports that she has not been able to eat or drink anything well for about 1 week.  She reports she has been too scared.  She reports that she has been prescribed ondansetron  every 8 hours as needed however she feels like she gets about 4 hours of not feeling nauseous and then the nausea and occasional vomiting returns.  She has been too scared therefore to eat while on home ondansetron .  She reports suprapubic tenderness and fear of going home and having the symptoms returned.  She reports subjective fever yesterday however did not check her temperature.  She denies any dysuria, hematuria at this time.  She reports that dysuria has  resolved.  She denies no known contacts.  She reports generalized body aches, muscle aches for about 1 week during this time.  Social history: She lives at home with her mother and she helps care for her 75 year old mother.  She endorses smoking half a pack per day and vaping nicotine  daily.  She denies frequent EtOH use and does not remember the last time she had an alcoholic beverage.  She denies recreational drug use.  She works as a Scientist, research (medical).  ROS: Constitutional: no weight change, no fever ENT/Mouth: no sore throat, no rhinorrhea Eyes: no eye pain, no vision changes Cardiovascular: no chest pain, no dyspnea,  no edema, no palpitations Respiratory: no cough, no sputum, no wheezing Gastrointestinal: + nausea, + vomiting, no diarrhea, no constipation Genitourinary: no urinary incontinence, no dysuria, no hematuria Musculoskeletal: no arthralgias, no myalgias Skin: no skin lesions, no pruritus, Neuro: + weakness, no loss of consciousness, no syncope Psych: no anxiety, no depression, + decrease appetite Heme/Lymph: no bruising, no bleeding  ED Course: Discussed with EDP, patient requiring hospitalization for chief concerns of intractable nausea and vomiting.  Assessment/Plan  Principal Problem:   Intractable nausea and vomiting Active Problems:   Anxiety and depression   GERD (gastroesophageal reflux disease)   HLD (hyperlipidemia)   Insomnia   COPD (chronic obstructive pulmonary disease) (HCC)   Dyslipidemia   Tobacco abuse   Panic attack   Substance induced mood disorder (HCC)   MDD (major depressive disorder)   Suprapubic tenderness   History of UTI   Generalized body aches   Assessment and  Plan:  * Intractable nausea and vomiting Symptomatic support, query gastroenteritis from viral infection Continue sodium chloride  infusion at 100 mL/h Ondansetron  4 mg p.o./IV as needed for nausea and vomiting, 5 days ordered; Phenergan  12.5 mg p.o. as needed for refractory  nausea and vomiting, 1 day ordered Check BMP in a.m.  Generalized body aches With nausea and vomiting for 1 week Check CK, respiratory 20 pathogen panel CK was not elevated  History of UTI UA was negative for leukocytes and nitrites.  The appearance was yellow and clear in color with appropriate specific gravity. Patient has taken 4 days of Keflex  500 mg 3 times daily that was prescribed on 04/14/2024 and patient received 1 dose of ceftriaxone  in the ED today I do not believe patient would benefit from further antibiotics at this time therefore I have not resumed antibiotics on admission  Counseling: Advised that patient should wipe from the front to the back after urination and bowel movements.  If patient should have sexual intercourse to urinate after sexual intercourse.  And patient is to stop using soap or personal hygiene products to wipe/clean her labia, vaginal area.  The use of soap even if it is not non-antibacterial would prevent the growth of healthy flora that is part of the human body's natural defense against infection including UTI.  Counseled patient on trying to take pre and post biotic that is encapsulated so that it would travel to her colon.  Personal hygiene especially cleaning the labia and vaginal area should be done with pain water and can be lukewarm.  Avoid using excessively hot water.  Patient endorses understanding and compliance.  Suprapubic tenderness Unclear etiology at this time No pain response with mild suprapubic palpation and general abdominal palpation at bedside May be related to patient not eating for about 1 week Toradol  15 mg IV every 6 hours as needed for moderate pain, 1 day ordered  MDD (major depressive disorder) Escitalopram  10 mg daily resumed Lorazepam  1 mg p.o. every 6 hours as needed for anxiety, 2 doses ordered  Panic attack Patient appears calm at this time Lorazepam  1 mg p.o. every 6 hours.  For anxiety, 2 doses ordered  Tobacco  abuse As nicotine  patch ordered Counseling at bedside  COPD (chronic obstructive pulmonary disease) (HCC) Not in acute exacerbation at this time Counseling on cessation of tobacco products DuoNebs every 6 hours as needed for shortness of breath, wheezing, 3 days ordered  Insomnia Melatonin 5 mg nightly as needed for sleep  GERD (gastroesophageal reflux disease) Protonix  40 mg p.o. twice daily, 2 doses ordered  Chart reviewed.   DVT prophylaxis: Heparin  5000 units subcutaneous every 8 hours Code Status: Full code Diet: Regular diet Family Communication: No Disposition Plan: Pending clinical course, anticipate discharge in 04/19/2024 Consults called: None at this time Admission status: Telemetry medical, observation  Past Medical History:  Diagnosis Date   Anxiety disorder    Arthritis    Back pain, chronic    Bipolar disorder (HCC)    Cellulitis    Cervicalgia    Chicken pox    Chronic back pain    COPD (chronic obstructive pulmonary disease) (HCC)    Depression    Drug abuse (HCC)    GERD (gastroesophageal reflux disease)    Hx of migraines    Hyperlipidemia    Insomnia    Medical history non-contributory    Migraine    Nicotine  dependence    Seizures (HCC)    pt had 1 seizure October  2012- no other history of seizures   Suicide attempt (HCC) 01/02/2020   Vitamin D  deficiency    Past Surgical History:  Procedure Laterality Date   ABDOMINAL HYSTERECTOMY     BREAST SURGERY     CESAREAN SECTION     CHOLECYSTECTOMY     COLONOSCOPY N/A 12/27/2023   Procedure: COLONOSCOPY;  Surgeon: Maryruth Ole DASEN, MD;  Location: [facility] ENDOSCOPY;  Service: Endoscopy;  Laterality: N/A;   ESOPHAGOGASTRODUODENOSCOPY     ESOPHAGOGASTRODUODENOSCOPY N/A 12/27/2023   Procedure: EGD (ESOPHAGOGASTRODUODENOSCOPY);  Surgeon: Maryruth Ole DASEN, MD;  Location[facility] ENDOSCOPY;  Service: Endoscopy;  Laterality: N/A;   ESOPHAGOGASTRODUODENOSCOPY (EGD) WITH PROPOFOL  N/A 09/23/2018    Procedure: ESOPHAGOGASTRODUODENOSCOPY (EGD) WITH PROPOFOL ;  Surgeon: Unk Corinn Skiff, MD;  Location: [facility] ENDOSCOPY;  Service: Gastroenterology;  Laterality: N/A;   Social History:  reports that she has been smoking cigarettes. She has a 12.5 pack-year smoking history. She has never used smokeless tobacco. She reports current drug use. Drug: Methamphetamines. She reports that she does not drink alcohol.  Allergies  Allergen Reactions   Paxil [Paroxetine Hcl]    Simvastatin    Sulfa Drugs Cross Reactors    Family History  Problem Relation Age of Onset   Diabetes Mother    Depression Mother    Anxiety disorder Mother    Alcohol abuse Father    Asthma Father    COPD Father    Early death Father    Bipolar disorder Father    Heart failure Sister        from chemotherapy    Arthritis Maternal Grandmother    Stroke Maternal Grandmother    Pancreatic cancer Maternal Grandfather    Anxiety disorder Maternal Grandfather    Depression Maternal Grandfather    Bipolar disorder Paternal Uncle    Bipolar disorder Cousin    Family history: Family history reviewed and not pertinent.  Prior to Admission medications   Medication Sig Start Date End Date Taking? Authorizing Provider  cephALEXin  (KEFLEX ) 500 MG capsule Take 1 capsule (500 mg total) by mouth 3 (three) times daily for 7 days. 04/14/24 04/21/24 Yes Simmons-Robinson, Makiera, MD  escitalopram  (LEXAPRO ) 10 MG tablet Take 1 tablet (10 mg total) by mouth daily. 04/14/24  Yes Simmons-Robinson, Rockie, MD  ondansetron  (ZOFRAN -ODT) 4 MG disintegrating tablet Take 1 tablet (4 mg total) by mouth every 8 (eight) hours as needed for nausea or vomiting. 04/17/24  Yes Simmons-Robinson, Makiera, MD  gabapentin  (NEURONTIN ) 800 MG tablet Take 1 tablet (800 mg total) by mouth daily. 06/10/20 07/10/20  Catha Reche PARAS, PA   Physical Exam: Vitals:   04/18/24 1240 04/18/24 1245 04/18/24 1416 04/18/24 1509  BP: 130/87   108/75  Pulse: (!) 102 93  95   Resp: (!) 22 19  17   Temp:   98.2 F (36.8 C) 98.4 F (36.9 C)  TempSrc:   Oral Oral  SpO2: 97% 99%  99%  Weight:      Height:       Constitutional: appears older than chronological age, NAD, calm Eyes: PERRL, lids and conjunctivae normal ENMT: Mucous membranes are moist. Posterior pharynx clear of any exudate or lesions. Age-appropriate dentition. Hearing appropriate. Neck: normal, supple, no masses, no thyromegaly Respiratory: clear to auscultation bilaterally, no wheezing, no crackles. Normal respiratory effort. No accessory muscle use.  Cardiovascular: Regular rate and rhythm, no murmurs / rubs / gallops. No extremity edema. 2+ pedal pulses. No carotid bruits.  Abdomen: no tenderness, no masses palpated, no hepatosplenomegaly. Bowel  sounds positive.  Musculoskeletal: no clubbing / cyanosis. No joint deformity upper and lower extremities. Good ROM, no contractures, no atrophy. Normal muscle tone.  Skin: no rashes, lesions, ulcers. No induration Neurologic: Sensation intact. Strength 5/5 in all 4.  Psychiatric: Normal judgment and insight. Alert and oriented x 3. Normal mood.   EKG: independently reviewed, showing sinus rhythm with rate of 94, QTc 478  Chest x-ray on Admission: I personally reviewed and I agree with radiologist reading as below.  CT ABDOMEN PELVIS W CONTRAST Result Date: 04/18/2024 CLINICAL DATA:  Acute abdominal pain. Nausea and vomiting. Recent antibiotic therapy for urinary tract infection. EXAM: CT ABDOMEN AND PELVIS WITH CONTRAST TECHNIQUE: Multidetector CT imaging of the abdomen and pelvis was performed using the standard protocol following bolus administration of intravenous contrast. RADIATION DOSE REDUCTION: This exam was performed according to the departmental dose-optimization program which includes automated exposure control, adjustment of the mA and/or kV according to patient size and/or use of iterative reconstruction technique. CONTRAST:  OMNIPAQUE   IOHEXOL  300 MG/ML  SOLN COMPARISON:  08/28/2018 FINDINGS: Lower chest: Bilateral breast implants partially included. Hepatobiliary: Cholecystectomy. No significant hepatic parenchymal lesion identified. Common bile duct 0.7 cm in diameter, previously 0.9 cm, mild prominence likely a physiologic response to cholecystectomy. Pancreas: Unremarkable Spleen: Unremarkable Adrenals/Urinary Tract: Unremarkable Stomach/Bowel: Unremarkable. No findings of colitis. Normal appendix. Vascular/Lymphatic: Minimal abdominal aortic atheromatous vascular calcification. Reproductive: Hysterectomy with chronically stable soft tissue prominence of the vaginal cuff likely from retained cervix in the setting of partial hysterectomy. Other: No supplemental non-categorized findings. Musculoskeletal: Degenerative endplate findings substantial degenerative loss of intervertebral disc height at the L2-3 level without overt impingement. Left inferior foraminal disc protrusion at L2-3. IMPRESSION: 1. No acute findings. 2. Cholecystectomy. 3. Hysterectomy with chronically stable soft tissue prominence of the vaginal cuff likely from retained cervix in the setting of partial hysterectomy. 4. Substantial degenerative loss of intervertebral disc height at L2-3 without overt impingement. Left inferior foraminal disc protrusion at L2-3. 5. Mild aortic Atherosclerosis (ICD10-I70.0). Electronically Signed   By: Ryan Salvage M.D.   On: 04/18/2024 11:23   Labs on Admission: I have personally reviewed following labs  CBC: Recent Labs  Lab 04/14/24 1645 04/18/24 0856  WBC 6.5 8.0  HGB 14.3 15.5*  HCT 44.4 45.3  MCV 97 90.1  PLT 330 352   Basic Metabolic Panel: Recent Labs  Lab 04/14/24 1645 04/18/24 0856  NA 142 137  K 4.3 4.0  CL 103 99  CO2 20 28  GLUCOSE 99 133*  BUN 6 11  CREATININE 0.65 0.73  CALCIUM 9.9 10.0   GFR: Estimated Creatinine Clearance: 76.6 mL/min (by C-G formula based on SCr of 0.73 mg/dL).  Liver  Function Tests: Recent Labs  Lab 04/14/24 1645 04/18/24 0856  AST 12 43*  ALT 11 105*  ALKPHOS 75 95  BILITOT <0.2 0.7  PROT 7.5 7.9  ALBUMIN 4.8 4.6   Recent Labs  Lab 04/18/24 0856  LIPASE 31   Cardiac Enzymes: Recent Labs  Lab 04/18/24 0856  CKTOTAL 32*   Thyroid  Function Tests: Recent Labs    04/18/24 0856  TSH 0.299*  FREET4 0.77   Urine analysis:    Component Value Date/Time   COLORURINE YELLOW (A) 04/18/2024 0856   APPEARANCEUR CLEAR (A) 04/18/2024 0856   APPEARANCEUR Hazy 08/13/2013 2301   LABSPEC 1.015 04/18/2024 0856   LABSPEC 1.005 08/13/2013 2301   PHURINE 6.0 04/18/2024 0856   GLUCOSEU NEGATIVE 04/18/2024 0856   GLUCOSEU Negative 08/13/2013  2301   HGBUR NEGATIVE 04/18/2024 0856   BILIRUBINUR NEGATIVE 04/18/2024 0856   BILIRUBINUR small 04/14/2024 1552   BILIRUBINUR Negative 08/13/2013 2301   KETONESUR 5 (A) 04/18/2024 0856   PROTEINUR NEGATIVE 04/18/2024 0856   UROBILINOGEN 2.0 (A) 04/14/2024 1552   UROBILINOGEN 0.2 07/27/2011 1840   NITRITE NEGATIVE 04/18/2024 0856   LEUKOCYTESUR NEGATIVE 04/18/2024 0856   LEUKOCYTESUR Trace 08/13/2013 2301   This document was prepared using Dragon Voice Recognition software and may include unintentional dictation errors.  Dr. Sherre [facility]  If 7PM-7AM, please contact overnight-coverage provider If 7AM-7PM, please contact day attending provider [email or web address]  04/18/2024, 7:03 PM

## 2024-04-18 NOTE — Assessment & Plan Note (Signed)
 Protonix  40 mg p.o. twice daily, 2 doses ordered

## 2024-04-19 DIAGNOSIS — R112 Nausea with vomiting, unspecified: Secondary | ICD-10-CM | POA: Diagnosis not present

## 2024-04-19 LAB — RESPIRATORY PANEL BY PCR

## 2024-04-19 LAB — BASIC METABOLIC PANEL WITH GFR
Anion gap: 7 (ref 5–15)
BUN: 17 mg/dL (ref 6–20)
CO2: 25 mmol/L (ref 22–32)
Calcium: 8.8 mg/dL — ABNORMAL LOW (ref 8.9–10.3)
Chloride: 109 mmol/L (ref 98–111)
Creatinine, Ser: 0.6 mg/dL (ref 0.44–1.00)
GFR, Estimated: >60 mL/min (ref >60)
Glucose, Bld: 97 mg/dL (ref 70–99)
Potassium: 4.8 mmol/L (ref 3.5–5.1)
Sodium: 141 mmol/L (ref 135–145)

## 2024-04-19 LAB — CBC
HCT: 36.5 % (ref 36.0–46.0)
Hemoglobin: 12.1 g/dL (ref 12.0–15.0)
MCH: 30.6 pg (ref 26.0–34.0)
MCHC: 33.2 g/dL (ref 30.0–36.0)
MCV: 92.4 fL (ref 80.0–100.0)
Platelets: 262 K/uL (ref 150–400)
RBC: 3.95 MIL/uL (ref 3.87–5.11)
RDW: 11.6 % (ref 11.5–15.5)
WBC: 6 K/uL (ref 4.0–10.5)
nRBC: 0 % (ref 0.0–0.2)

## 2024-04-19 LAB — HEPATIC FUNCTION PANEL
ALT: 54 U/L — ABNORMAL HIGH (ref 0–44)
AST: 21 U/L (ref 15–41)
Albumin: 3.4 g/dL — ABNORMAL LOW (ref 3.5–5.0)
Alkaline Phosphatase: 61 U/L (ref 38–126)
Bilirubin, Direct: <0.1 mg/dL (ref 0.0–0.2)
Total Bilirubin: 0.3 mg/dL (ref 0.0–1.2)
Total Protein: 5.7 g/dL — ABNORMAL LOW (ref 6.5–8.1)

## 2024-04-19 MED ORDER — LORAZEPAM 1 MG PO TABS
1.0000 mg | ORAL_TABLET | Freq: Two times a day (BID) | ORAL | Status: DC | PRN
  Administered 2024-04-19 – 2024-04-20 (×3): 1 mg via ORAL
  Filled 2024-04-19 (×3): qty 1

## 2024-04-19 MED ORDER — PANTOPRAZOLE SODIUM 40 MG PO TBEC
40.0000 mg | DELAYED_RELEASE_TABLET | Freq: Every day | ORAL | Status: DC
  Administered 2024-04-20: 40 mg via ORAL
  Filled 2024-04-19: qty 1

## 2024-04-19 MED ORDER — ENOXAPARIN SODIUM 40 MG/0.4ML IJ SOSY
40.0000 mg | PREFILLED_SYRINGE | INTRAMUSCULAR | Status: DC
  Administered 2024-04-19: 40 mg via SUBCUTANEOUS
  Filled 2024-04-19: qty 0.4

## 2024-04-19 MED ORDER — GABAPENTIN 100 MG PO CAPS: 100.0000 mg | ORAL_CAPSULE | Freq: Three times a day (TID) | ORAL | Status: DC

## 2024-04-19 MED ORDER — MAGNESIUM OXIDE -MG SUPPLEMENT 400 (240 MG) MG PO TABS
400.0000 mg | ORAL_TABLET | Freq: Two times a day (BID) | ORAL | Status: DC
  Administered 2024-04-19 – 2024-04-20 (×3): 400 mg via ORAL
  Filled 2024-04-19 (×3): qty 1

## 2024-04-19 MED ORDER — GABAPENTIN 100 MG PO CAPS
200.0000 mg | ORAL_CAPSULE | Freq: Three times a day (TID) | ORAL | Status: DC
  Administered 2024-04-19 – 2024-04-20 (×4): 200 mg via ORAL
  Filled 2024-04-19 (×4): qty 2

## 2024-04-19 NOTE — Progress Notes (Addendum)
 Progress Note   Patient: Erin [name] FMW:[id number] DOB: 1970/10/13 DOA: 04/18/2024     0 DOS: the patient was seen and examined on 04/19/2024   Brief hospital course:  Erin [name] is a 53 year old female with history of anxiety with panic attacks, neuropathy, bipolar, COPD, gastric reflux, migraines, presents emergency department for chief concerns of abdominal pain, nausea and vomiting for the last 5 days...  See H&P for full HPI on admission & ED course.  Patient was admitted for further evaluation and management as outlined in detail below.  UA was checked and negative for evidence of UTI.  Antibiotics were given in the ED but not continued for admission.  Pt denying UTI symptoms.    Assessment and Plan:  * Intractable nausea and vomiting - suspect viral gastroenteritis vs oral antibiotic side effects.  Clinically improving, not vomiting this AM, but pt reports persistent nausea. --Continue IV fluids through this afternoon --PRN IV Zofran  or Phenergan  --Monitor renal function & electrolytes  Generalized body aches -- suspect related to acute illness causing N/V, and possibly from vomiting for 1 week.  CK was low at 32 on admission.  Negative respiratory viral studies. --Supportive care  History of UTI UA was negative for leukocytes and nitrites.  The appearance was yellow and clear in color with appropriate specific gravity.  Prior to admission, pt had taken 4 days of Keflex  prescribed on 04/14/2024 and was given a dose of Rocephin  in the ED. Pt denying UTI symptoms --No indication for further antibiotics at this time --Monitor clinically for symptoms  Suprapubic tenderness - resolved - not reported 7/13 --PRN Toradol   MDD (major depressive disorder) Generalized anxiety with panic attacks Insomnia --Continue home Lexapro   --Trial low dose gabapentin  200 mg TID for anxiety & sleep --PRN low dose PO Ativan  for anxiety despite gabapentin  --Slo-Mag at  bedtime --Melatonin QHS  Tobacco abuse --Nicotine  patch  --Pt was counseled on importance of cessation  COPD - stable, not exacerbated --PRN Duonebs  GERD  --Protonix   Hyperlipidemia -- new diagnosis, recent lipid panel ordered by PCP --Follow up with PCP to start therapy     Subjective: Pt seen this AM at bedside.  She denies vomiting but has persistent nausea.  She reports uncontrolled anxiety as well as the sensation of inner vibration or buzzing in both legs.  She reports this and her nausea improved after Ativan  was given yesterday.  We discussed trial of gabapentin  since she just started Lexapro  and it's effects will take a few weeks.  She states at times the internal buzzing sensation involves her whole body, and usually at times she is more anxiety/panicked feeling.  No fever or chill or dysuria.   Physical Exam: Vitals:   04/18/24 2113 04/19/24 0005 04/19/24 0420 04/19/24 0838  BP: 107/86 91/61 107/69 107/72  Pulse: 100 78 78 91  Resp: 18 20 18 16   Temp: 98 F (36.7 C) 98 F (36.7 C) 97.6 F (36.4 C) 98.9 F (37.2 C)  TempSrc:      SpO2: 98% 96% 99% 98%  Weight:      Height:       General exam: awake, alert, no acute distress HEENT: moist mucus membranes, hearing grossly normal  Respiratory system: CTAB, no wheezes, rales or rhonchi, normal respiratory effort. Cardiovascular system: normal S1/S2, RRR, no pedal edema.   Gastrointestinal system: soft, NT, ND Central nervous system: A&O x 3. no gross focal neurologic deficits, normal speech Extremities: moves all, no edema, normal tone Skin:  dry, intact, normal temperature Psychiatry: anxious mood, congruent affect, judgement and insight appear normal   Data Reviewed:  Notable labs -- Ca 8.8, albumin 3.4, AST normalized 43 >> 21, ALT improved 105 >> 54  Family Communication: None. Pt updated in detail  Disposition: Status is: Observation Pt remains admitted pending further clinical improvement, remains on  IV fluids given persistent nausea and tolerating minimal PO intake at this time    Planned Discharge Destination: Home    Time spent: 45 minutes  Author: Burnard DELENA Cunning, DO 04/19/2024 1:42 PM  For on call review [email or web address].

## 2024-04-19 NOTE — Plan of Care (Signed)
  Problem: Coping: Goal: Level of anxiety will decrease Outcome: Not Progressing   Problem: Education: Goal: Knowledge of General Education information will improve Description: Including pain rating scale, medication(s)/side effects and non-pharmacologic comfort measures Outcome: Progressing   Problem: Health Behavior/Discharge Planning: Goal: Ability to manage health-related needs will improve Outcome: Progressing   Problem: Clinical Measurements: Goal: Ability to maintain clinical measurements within normal limits will improve Outcome: Progressing Goal: Will remain free from infection Outcome: Progressing Goal: Diagnostic test results will improve Outcome: Progressing Goal: Respiratory complications will improve Outcome: Progressing Goal: Cardiovascular complication will be avoided Outcome: Progressing   Problem: Activity: Goal: Risk for activity intolerance will decrease Outcome: Progressing   Problem: Nutrition: Goal: Adequate nutrition will be maintained Outcome: Progressing   Problem: Elimination: Goal: Will not experience complications related to bowel motility Outcome: Progressing Goal: Will not experience complications related to urinary retention Outcome: Progressing   Problem: Pain Managment: Goal: General experience of comfort will improve and/or be controlled Outcome: Progressing   Problem: Safety: Goal: Ability to remain free from injury will improve Outcome: Progressing   Problem: Skin Integrity: Goal: Risk for impaired skin integrity will decrease Outcome: Progressing

## 2024-04-20 DIAGNOSIS — R112 Nausea with vomiting, unspecified: Secondary | ICD-10-CM | POA: Diagnosis not present

## 2024-04-20 LAB — VITAMIN B12: Vitamin B-12: 434 pg/mL (ref 180–914)

## 2024-04-20 LAB — MAGNESIUM: Magnesium: 1.9 mg/dL (ref 1.7–2.4)

## 2024-04-20 MED ORDER — MAGNESIUM GLYCINATE 100 MG PO CAPS: 200.0000 mg | ORAL_CAPSULE | Freq: Every day | ORAL | Status: AC

## 2024-04-20 MED ORDER — DIAZEPAM 10 MG PO TABS: 10.0000 mg | ORAL_TABLET | Freq: Two times a day (BID) | ORAL | 0 refills | Status: AC | PRN

## 2024-04-20 MED ORDER — GABAPENTIN 100 MG PO CAPS: 200.0000 mg | ORAL_CAPSULE | Freq: Three times a day (TID) | ORAL | 0 refills | Status: DC

## 2024-04-20 NOTE — Discharge Summary (Signed)
 Physician Discharge Summary   Patient: Erin [name] MRN: [id number] DOB: February 05, 1971  Admit date:     04/18/2024  Discharge date: 04/20/2024  Discharge Physician: Burnard DELENA Cunning   PCP: Sharma Coyer, MD   Recommendations at discharge:   Follow up with Primary Care in 1-2 weeks Repeat CBC, CMP at follow up Follow up on recent lipid panel and recommend initiating lipid lowering therapy Follow up with Psychiatry and also recommend Psychologist with somatic focus for management of anxiety and panic symptoms  Discharge Diagnoses: Principal Problem:   Intractable nausea and vomiting Active Problems:   Anxiety and depression   GERD (gastroesophageal reflux disease)   HLD (hyperlipidemia)   Insomnia   COPD (chronic obstructive pulmonary disease) (HCC)   Dyslipidemia   Tobacco abuse   Panic attack   Substance induced mood disorder (HCC)   MDD (major depressive disorder)   Suprapubic tenderness   History of UTI   Generalized body aches  Resolved Problems:   * No resolved hospital problems. *  Hospital Course:  Erin [name] is a 53 year old female with history of anxiety with panic attacks, neuropathy, bipolar, COPD, gastric reflux, migraines, presents emergency department for chief concerns of abdominal pain, nausea and vomiting for the last 5 days...  See H&P for full HPI on admission & ED course.   Patient was admitted for further evaluation and management as outlined in detail below.   UA was checked and negative for evidence of UTI.  Antibiotics were given in the ED but not continued for admission.  Pt denying UTI symptoms.     7/14 -- pt feeling better this AM. Tolerating meals, no nausea/vomiting.  Pt continues to report high anxiety and body symptoms of internal vibrating, buzzing and shaking.  At it's best, only affects the legs, but when more anxious feels throughout her torso as well.  Improves after given anxiety medication.  Pt was just recently  resumed on low dose Lexapro , not yet helping.      Assessment and Plan:  * Intractable nausea and vomiting - suspect viral gastroenteritis vs oral antibiotic side effects.  Clinically improved, no longer vomiting and nausea better today. Tolerating meals. Treated with supportive care -- IV fluids, PRN IV Zofran  or Phenergan  --Monitor renal function & electrolytes at follow up --Consider GI follow up / referral if persistent or recurrent but suspect this was acute illness that will resolve   Generalized body aches -- suspect related to acute illness causing N/V, and possibly from vomiting for 1 week.  CK was low at 32 on admission.  Negative respiratory viral studies. --Supportive care   History of UTI UA was negative for leukocytes and nitrites.  The appearance was yellow and clear in color with appropriate specific gravity.  Prior to admission, pt had taken 4 days of Keflex  prescribed on 04/14/2024 and was given a dose of Rocephin  in the ED. Pt denying UTI symptoms --No indication for further antibiotics at this time --Monitor clinically for symptoms   Suprapubic tenderness - resolved - not reported 7/13 Given PRN Toradol  with improvement   MDD (major depressive disorder) Generalized anxiety with panic attacks Insomnia --Continue home Lexapro   --Started trial of low dose gabapentin  200 mg TID for anxiety & sleep --PRN low dose PO Ativan  for anxiety despite gabapentin  --Slo-Mag at bedtime --Melatonin at bedtime --Outpatient follow up with Psychiatry --Recommend also Psychotherapy with provider focusing on somatics    Tobacco abuse --Nicotine  patch  --Pt was counseled on importance of cessation  COPD - stable, not exacerbated --PRN Duonebs   GERD  --Protonix    Hyperlipidemia -- new diagnosis, recent lipid panel ordered by PCP --Follow up with PCP to start therapy       Consultants: None Procedures performed: None  Disposition: Home Diet recommendation:  Regular  diet DISCHARGE MEDICATION: Allergies as of 04/20/2024       Reactions   Paxil [paroxetine Hcl]    Simvastatin    Sulfa Drugs Cross Reactors         Medication List     STOP taking these medications    cephALEXin  500 MG capsule Commonly known as: KEFLEX    gabapentin  800 MG tablet Commonly known as: Neurontin  Replaced by: gabapentin  100 MG capsule       TAKE these medications    escitalopram  10 MG tablet Commonly known as: Lexapro  Take 1 tablet (10 mg total) by mouth daily.   gabapentin  100 MG capsule Commonly known as: NEURONTIN  Take 2 capsules (200 mg total) by mouth 3 (three) times daily. Replaces: gabapentin  800 MG tablet   Magnesium  Glycinate 100 MG Caps Take 200 mg by mouth at bedtime.   ondansetron  4 MG disintegrating tablet Commonly known as: ZOFRAN -ODT Take 1 tablet (4 mg total) by mouth every 8 (eight) hours as needed for nausea or vomiting.       ASK your doctor about these medications    diazepam  10 MG tablet Commonly known as: Valium  Take 1 tablet (10 mg total) by mouth 2 (two) times daily as needed for up to 8 days for anxiety. Ask about: Should I take this medication?        Follow-up Information     Simmons-Robinson, Rockie, MD Follow up.   Specialty: Family Medicine Why: Hospital follow up Contact information: [address] [place] [address] [phone]                Discharge Exam: Erin [name]   04/18/24 0854  Weight: 59 kg   General exam: awake, alert, no acute distress Respiratory system: CTAB, no wheezes, rales or rhonchi, normal respiratory effort. Cardiovascular system: normal S1/S2, RRR,no pedal edema.   Gastrointestinal system: soft, NT, ND, no HSM felt, +bowel sounds. Central nervous system: A&O x3. no gross focal neurologic deficits, normal speech Extremities: moves all, no edema, normal tone Skin: dry, intact, normal temperature Psychiatry: normal mood, congruent affect,  judgement and insight appear normal   Condition at discharge: stable  The results of significant diagnostics from this hospitalization (including imaging, microbiology, ancillary and laboratory) are listed below for reference.   Imaging Studies: CT ABDOMEN PELVIS W CONTRAST Result Date: 04/18/2024 CLINICAL DATA:  Acute abdominal pain. Nausea and vomiting. Recent antibiotic therapy for urinary tract infection. EXAM: CT ABDOMEN AND PELVIS WITH CONTRAST TECHNIQUE: Multidetector CT imaging of the abdomen and pelvis was performed using the standard protocol following bolus administration of intravenous contrast. RADIATION DOSE REDUCTION: This exam was performed according to the departmental dose-optimization program which includes automated exposure control, adjustment of the mA and/or kV according to patient size and/or use of iterative reconstruction technique. CONTRAST:  OMNIPAQUE  IOHEXOL  300 MG/ML  SOLN COMPARISON:  08/28/2018 FINDINGS: Lower chest: Bilateral breast implants partially included. Hepatobiliary: Cholecystectomy. No significant hepatic parenchymal lesion identified. Common bile duct 0.7 cm in diameter, previously 0.9 cm, mild prominence likely a physiologic response to cholecystectomy. Pancreas: Unremarkable Spleen: Unremarkable Adrenals/Urinary Tract: Unremarkable Stomach/Bowel: Unremarkable. No findings of colitis. Normal appendix. Vascular/Lymphatic: Minimal abdominal aortic atheromatous vascular calcification. Reproductive: Hysterectomy  with chronically stable soft tissue prominence of the vaginal cuff likely from retained cervix in the setting of partial hysterectomy. Other: No supplemental non-categorized findings. Musculoskeletal: Degenerative endplate findings substantial degenerative loss of intervertebral disc height at the L2-3 level without overt impingement. Left inferior foraminal disc protrusion at L2-3. IMPRESSION: 1. No acute findings. 2. Cholecystectomy. 3. Hysterectomy  with chronically stable soft tissue prominence of the vaginal cuff likely from retained cervix in the setting of partial hysterectomy. 4. Substantial degenerative loss of intervertebral disc height at L2-3 without overt impingement. Left inferior foraminal disc protrusion at L2-3. 5. Mild aortic Atherosclerosis (ICD10-I70.0). Electronically Signed   By: Ryan Salvage M.D.   On: 04/18/2024 11:23    Microbiology: Results for orders placed or performed during the hospital encounter of 04/18/24  Respiratory (~20 pathogens) panel by PCR     Status: None   Collection Time: 04/18/24  7:03 PM   Specimen: Nasopharyngeal Swab; Respiratory  Result Value Ref Range Status   Adenovirus NOT DETECTED NOT DETECTED Final   Coronavirus 229E NOT DETECTED NOT DETECTED Final    Comment: (NOTE) The Coronavirus on the Respiratory Panel, DOES NOT test for the novel  Coronavirus (2019 nCoV)    Coronavirus HKU1 NOT DETECTED NOT DETECTED Final   Coronavirus NL63 NOT DETECTED NOT DETECTED Final   Coronavirus OC43 NOT DETECTED NOT DETECTED Final   Metapneumovirus NOT DETECTED NOT DETECTED Final   Rhinovirus / Enterovirus NOT DETECTED NOT DETECTED Final   Influenza A NOT DETECTED NOT DETECTED Final   Influenza B NOT DETECTED NOT DETECTED Final   Parainfluenza Virus 1 NOT DETECTED NOT DETECTED Final   Parainfluenza Virus 2 NOT DETECTED NOT DETECTED Final   Parainfluenza Virus 3 NOT DETECTED NOT DETECTED Final   Parainfluenza Virus 4 NOT DETECTED NOT DETECTED Final   Respiratory Syncytial Virus NOT DETECTED NOT DETECTED Final   Bordetella pertussis NOT DETECTED NOT DETECTED Final   Bordetella Parapertussis NOT DETECTED NOT DETECTED Final   Chlamydophila pneumoniae NOT DETECTED NOT DETECTED Final   Mycoplasma pneumoniae NOT DETECTED NOT DETECTED Final    Comment: Performed at [facility], [address], [place], [place] [address]  SARS Coronavirus 2 by RT PCR (hospital order, performed in [facility]  hospital lab) *cepheid single result test*     Status: None   Collection Time: 04/18/24  7:03 PM  Result Value Ref Range Status   SARS Coronavirus 2 by RT PCR NEGATIVE NEGATIVE Final    Comment: (NOTE) SARS-CoV-2 target nucleic acids are NOT DETECTED.  The SARS-CoV-2 RNA is generally detectable in upper and lower respiratory specimens during the acute phase of infection. The lowest concentration of SARS-CoV-2 viral copies this assay can detect is 250 copies / mL. A negative result does not preclude SARS-CoV-2 infection and should not be used as the sole basis for treatment or other patient management decisions.  A negative result may occur with improper specimen collection / handling, submission of specimen other than nasopharyngeal swab, presence of viral mutation(s) within the areas targeted by this assay, and inadequate number of viral copies (<250 copies / mL). A negative result must be combined with clinical observations, patient history, and epidemiological information.  Fact Sheet for Patients:   RoadLapTop.co.za  Fact Sheet for Healthcare Providers: http://kim-miller.com/  This test is not yet approved or  cleared by the [place]  FDA and has been authorized for detection and/or diagnosis of SARS-CoV-2 by FDA under an Emergency Use Authorization (EUA).  This EUA  will remain in effect (meaning this test can be used) for the duration of the COVID-19 declaration under Section 564(b)(1) of the Act, 21 U.S.C. section 360bbb-3(b)(1), unless the authorization is terminated or revoked sooner.  Performed at [facility], [address] [place], [place] [address]     Labs: CBC: No results for input(s): WBC, NEUTROABS, HGB, HCT, MCV, PLT in the last 168 hours.  Basic Metabolic Panel: No results for input(s): NA, K, CL, CO2, GLUCOSE, BUN, CREATININE, CALCIUM, MG, PHOS in the last 168  hours.  Liver Function Tests: No results for input(s): AST, ALT, ALKPHOS, BILITOT, PROT, ALBUMIN in the last 168 hours.  CBG: No results for input(s): GLUCAP in the last 168 hours.  Discharge time spent: greater than 30 minutes.  Signed: Burnard DELENA Cunning, DO [facility] 05/01/2024

## 2024-04-20 NOTE — Plan of Care (Deleted)
 Patient remains free from any noted signs of acute distress.  Rested throughout current shift.  Had prn ativan  medication to assist with reported anxiety, with noted effective outcome.  No additional medical interventions required.  Patient to continue to be monitored by hospital staff until discharged.

## 2024-04-20 NOTE — Progress Notes (Signed)
 Patient remains free from any noted signs of acute distress.  Rested throughout current shift.  Had prn ativan  medication to assist with reported anxiety, with noted effective outcome.  No additional medical interventions required.  Patient to continue to be monitored by hospital staff until discharged.

## 2024-04-21 ENCOUNTER — Ambulatory Visit: Payer: Self-pay | Admitting: Family Medicine

## 2024-05-07 ENCOUNTER — Ambulatory Visit: Payer: MEDICAID | Admitting: Family Medicine

## 2024-05-12 ENCOUNTER — Ambulatory Visit: Payer: MEDICAID | Admitting: Family Medicine

## 2024-06-22 ENCOUNTER — Ambulatory Visit: Payer: MEDICAID | Admitting: Family Medicine

## 2024-06-22 NOTE — Progress Notes (Deleted)
      Established patient visit   Patient: Erin [name]   DOB: 1970-11-03   53 y.o. Female  MRN: [id number] Visit Date: 06/22/2024  Today's healthcare provider: Rockie Agent, MD   No chief complaint on file.  Subjective       Discussed the use of AI scribe software for clinical note transcription with the patient, who gave verbal consent to proceed.  History of Present Illness      Past Medical History:  Diagnosis Date   Anxiety disorder    Arthritis    Back pain, chronic    Bipolar disorder (HCC)    Cellulitis    Cervicalgia    Chicken pox    Chronic back pain    COPD (chronic obstructive pulmonary disease) (HCC)    Depression    Drug abuse (HCC)    GERD (gastroesophageal reflux disease)    Hx of migraines    Hyperlipidemia    Insomnia    Medical history non-contributory    Migraine    Nicotine  dependence    Seizures (HCC)    pt had 1 seizure October 2012- no other history of seizures   Suicide attempt (HCC) 01/02/2020   Vitamin D  deficiency     Medications: Outpatient Medications Prior to Visit  Medication Sig   escitalopram  (LEXAPRO ) 10 MG tablet Take 1 tablet (10 mg total) by mouth daily.   gabapentin  (NEURONTIN ) 100 MG capsule Take 2 capsules (200 mg total) by mouth 3 (three) times daily.   Magnesium  Glycinate 100 MG CAPS Take 200 mg by mouth at bedtime.   ondansetron  (ZOFRAN -ODT) 4 MG disintegrating tablet Take 1 tablet (4 mg total) by mouth every 8 (eight) hours as needed for nausea or vomiting.   No facility-administered medications prior to visit.    Review of Systems  {Insert previous labs (optional):23779} {See past labs  Heme  Chem  Endocrine  Serology  Results Review (optional):1}   Objective    There were no vitals taken for this visit. {Insert last BP/Wt (optional):23777}{See vitals history (optional):1}    Physical Exam  ***  No results found for any visits on 06/22/24.  Assessment & Plan     Problem  List Items Addressed This Visit   None   Assessment and Plan Assessment & Plan      No follow-ups on file.         Rockie Agent, MD  [facility] [phone] (phone) [phone] (fax)  [facility]

## 2024-06-24 ENCOUNTER — Emergency Department: Payer: MEDICAID

## 2024-06-24 ENCOUNTER — Ambulatory Visit: Payer: MEDICAID | Admitting: Family Medicine

## 2024-06-24 ENCOUNTER — Other Ambulatory Visit: Payer: Self-pay

## 2024-06-24 ENCOUNTER — Emergency Department
Admission: EM | Admit: 2024-06-24 | Discharge: 2024-06-24 | Disposition: A | Discharge status: Home / Self Care | Payer: MEDICAID | Attending: Emergency Medicine | Admitting: Emergency Medicine

## 2024-06-24 DIAGNOSIS — M25551 Pain in right hip: Secondary | ICD-10-CM | POA: Diagnosis present

## 2024-06-24 DIAGNOSIS — R030 Elevated blood-pressure reading, without diagnosis of hypertension: Secondary | ICD-10-CM | POA: Diagnosis not present

## 2024-06-24 DIAGNOSIS — J449 Chronic obstructive pulmonary disease, unspecified: Secondary | ICD-10-CM | POA: Insufficient documentation

## 2024-06-24 DIAGNOSIS — M7631 Iliotibial band syndrome, right leg: Secondary | ICD-10-CM

## 2024-06-24 MED ORDER — LIDOCAINE 5 % EX PTCH: 1.0000 | MEDICATED_PATCH | CUTANEOUS | 0 refills | Status: AC

## 2024-06-24 MED ORDER — IBUPROFEN 600 MG PO TABS
600.0000 mg | ORAL_TABLET | Freq: Once | ORAL | Status: AC
  Administered 2024-06-24: 600 mg via ORAL
  Filled 2024-06-24: qty 1

## 2024-06-24 MED ORDER — METHOCARBAMOL 500 MG PO TABS
500.0000 mg | ORAL_TABLET | Freq: Once | ORAL | Status: AC
  Administered 2024-06-24: 500 mg via ORAL
  Filled 2024-06-24: qty 1

## 2024-06-24 MED ORDER — MELOXICAM 15 MG PO TABS
15.0000 mg | ORAL_TABLET | Freq: Every day | ORAL | 0 refills | Status: AC
Start: 2024-06-24 — End: 2024-07-08

## 2024-06-24 MED ORDER — LIDOCAINE 5 % EX PTCH
1.0000 | MEDICATED_PATCH | CUTANEOUS | Status: DC
  Administered 2024-06-24: 1 via TRANSDERMAL
  Filled 2024-06-24: qty 1

## 2024-06-24 NOTE — Discharge Instructions (Addendum)
 We believe that your symptoms are caused by musculoskeletal strain.  Please read through the included information about additional care such as heating pads, over-the-counter pain medicine. Remember that early mobility and using the affected part of your body is actually better than keeping it immobile.  Please take Tylenol  as needed for pain, but only as written on the box.  You were prescribed Meloxicam  (antiinflammatory) and lidocaine  patches to help with your pain.  Please take these medications only as prescribed.  Please do not take Ibuprofen , Aleve , Advil , Motrin , Naproxen , Aspirin, or any other non-steroidal antiinflammatory drug (NSAID) while taking the Meloxicam . Please stop taking the Meloxicam  if you experience any stomach cramping.   Follow-up with the doctor listed as recommended if you are not feeling better in 1 week or return to the emergency department with new or worsening symptoms that concern you.  Blood pressure was also elevated today.  Please follow-up with your primary care doctor for recheck of your blood pressure and to see if you need to start blood pressure medications.  I have also provided you information on sciatica to read on if you would like.

## 2024-06-24 NOTE — ED Provider Notes (Signed)
 [facility] Provider Note    Event Date/Time   First MD Initiated Contact with Patient 06/24/24 1217     (approximate)   History   Hip Pain   HPI  Erin [name] is a 53 y.o. female  with a past medical history of seizures, depression, bipolar disorder, anxiety, migraines, COPD, opioid and alcohol abuse presents to the emergency department with right hip pain x 1 day.  Described as tearing.  Patient denies back pain, any pain radiating down her leg, bowel or bladder incontinence, urinary retention, dysuria, abdominal pain, vomiting, nausea, fever, chills, fall or injury, SOB, chest pain. Patient has a history of abdominal hysterectomy.  Patient has been using ibuprofen  and Tylenol  at home without much relief of her pain.  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 No prior hip surgeries.    Physical Exam   Triage Vital Signs: ED Triage Vitals  Encounter Vitals Group     BP 06/24/24 1145 (!) 140/86     Girls Systolic BP Percentile --      Girls Diastolic BP Percentile --      Boys Systolic BP Percentile --      Boys Diastolic BP Percentile --      Pulse Rate 06/24/24 1145 86     Resp 06/24/24 1145 18     Temp 06/24/24 1145 98 F (36.7 C)     Temp Source 06/24/24 1145 Oral     SpO2 06/24/24 1145 98 %     Weight --      Height --      Head Circumference --      Peak Flow --      Pain Score 06/24/24 1142 8     Pain Loc --      Pain Education --      Exclude from Growth Chart --     Most recent vital signs: Vitals:   06/24/24 1145  BP: (!) 140/86  Pulse: 86  Resp: 18  Temp: 98 F (36.7 C)  SpO2: 98%    General: Well-appearing, in no acute distress. Appears stated age. Head: Normocephalic, atraumatic. CV: Regular rate, 86 bpm. Dorsalis pedis pulses 2+ bilaterally. <2 second capillary refill. Respiratory: Breath sounds clear b/l. No  wheezes, rales, or rhonchi. No respiratory distress. Normal respiratory effort. Skin:Warm, dry, intact. No rashes, lesions, or ecchymosis.  Neurological: A&Ox4 to person, place, time, and situation. Sensation intact to L4, L5, and S1.  GI: Soft, non-distended, non-tender. No rebound or guarding.  MSK: No spinal tenderness or paraspinal tenderness. Normal ROM with knee extension, dorsiflexion, and plantarflexion and 5/5 strength in bilateral lower extremities. No calf swelling or obvious deformities. Negative Homan's sign. Straight leg raise negative b/l. Able to do all hip ROM passively and actively; endorses lateral right thigh pain with hip abduction. No CVA tenderness bilaterally. Ambulatory with normal gait pattern. No specific tenderness along SI joint or trochanteric bursitis.    ED Results / Procedures / Treatments   Labs (all labs ordered are listed, but only abnormal results are displayed) Labs Reviewed - No data to display   EKG     RADIOLOGY  Right hip FINDINGS: There is no evidence of hip fracture or dislocation. There is no evidence of arthropathy. A benign 6 mm bone island is seen near the symphysis pubis on the right.   IMPRESSION: Negative.  PROCEDURES:  Critical Care performed: No   Procedures   MEDICATIONS ORDERED IN ED: Medications  lidocaine  (LIDODERM ) 5 % 1 patch (1 patch Transdermal Patch Applied 06/24/24 1336)  methocarbamol  (ROBAXIN ) tablet 500 mg (500 mg Oral Given 06/24/24 1336)  ibuprofen  (ADVIL ) tablet 600 mg (600 mg Oral Given 06/24/24 1336)     IMPRESSION / MDM / ASSESSMENT AND PLAN / ED COURSE  I reviewed the triage vital signs and the nursing notes.                              Differential diagnosis includes, but is not limited to, IT band syndrome, trochanteric bursitis, SI joint dysfunction, sciatica, hip osteoarthritis, labral tear  Patient's presentation is most consistent with acute complicated illness / injury requiring  diagnostic workup.  Patient is a 53 year old female presenting with right lateral thigh pain x 1 day.  Has tried OTC medications at home without any improvement.  She has normal range of motion of her bilateral hips right hip x-ray was ordered. I independently viewed the x-ray and radiologist's report.  I agree with the radiologist's report there are no acute findings other than a benign bony finding.  Patient's pain seems most consistent with IT band syndrome versus trochanteric bursitis.  Will send her with lidocaine  patches and meloxicam  prescription.  Discussed precautions regarding taking this medication.  She will follow-up with orthopedics as needed if she is not feeling better within the next week.   I did give her information on sciatica as she wanted to read more about it, but I do not think this is what is going on at this time.  She also had an elevated blood pressure reading.  I will have her follow-up with her PCP following today's visit for blood pressure recheck.  The patient may return to the emergency department for any new, worsening, or concerning symptoms. Patient was given the opportunity to ask questions; all questions were answered. Emergency department return precautions were discussed with the patient.  Patient is in agreement to the treatment plan.  Patient is stable for discharge.   FINAL CLINICAL IMPRESSION(S) / ED DIAGNOSES   Final diagnoses:  Right hip pain  Elevated blood pressure reading     Rx / DC Orders   ED Discharge Orders          Ordered    lidocaine  (LIDODERM ) 5 %  Every 24 hours        06/24/24 1325    meloxicam  (MOBIC ) 15 MG tablet  Daily        06/24/24 1325             Note:  This document was prepared using Dragon voice recognition software and may include unintentional dictation errors.     Sheron Salm, PA-C 06/24/24 1513    Dorothyann Drivers, MD 06/24/24 1844

## 2024-06-24 NOTE — ED Triage Notes (Addendum)
 Pt to ED via POV from home. Pt ambulatory to triage. Pt reports right  hip pain. Pt reports possible sciatica. Pt reports burning numbness and tingling. Denies injury

## 2024-06-25 ENCOUNTER — Telehealth: Payer: Self-pay

## 2024-06-25 ENCOUNTER — Ambulatory Visit: Payer: MEDICAID | Admitting: Family Medicine

## 2024-06-25 ENCOUNTER — Encounter: Payer: Self-pay | Admitting: Family Medicine

## 2024-06-25 VITALS — BP 109/79 | HR 75 | Temp 97.9°F | Ht 69.0 in | Wt 124.7 lb

## 2024-06-25 DIAGNOSIS — M25551 Pain in right hip: Secondary | ICD-10-CM | POA: Diagnosis not present

## 2024-06-25 DIAGNOSIS — R030 Elevated blood-pressure reading, without diagnosis of hypertension: Secondary | ICD-10-CM

## 2024-06-25 DIAGNOSIS — F319 Bipolar disorder, unspecified: Secondary | ICD-10-CM

## 2024-06-25 MED ORDER — METHYLPREDNISOLONE ACETATE 40 MG/ML IJ SUSP
40.0000 mg | Freq: Once | INTRAMUSCULAR | Status: AC
  Administered 2024-06-25: 40 mg via INTRAMUSCULAR

## 2024-06-25 MED ORDER — TRAMADOL HCL 50 MG PO TABS: 50.0000 mg | ORAL_TABLET | Freq: Three times a day (TID) | ORAL | 0 refills | Status: AC | PRN

## 2024-06-25 MED ORDER — ESCITALOPRAM OXALATE 20 MG PO TABS: 20.0000 mg | ORAL_TABLET | Freq: Every day | ORAL | 1 refills | Status: DC

## 2024-06-25 MED ORDER — GABAPENTIN 100 MG PO CAPS: 200.0000 mg | ORAL_CAPSULE | Freq: Three times a day (TID) | ORAL | 0 refills | Status: DC

## 2024-06-25 NOTE — Progress Notes (Signed)
 Established patient visit   Patient: Erin [name]   DOB: Mar 07, 1971   53 y.o. Female  MRN: [id number] Visit Date: 06/25/2024  Today's healthcare provider: Rockie Agent, MD   Chief Complaint  Patient presents with   Follow-up    Patient is present for follow up of ED visit yesterday. Elevated BP and hip pain. Patient is in pain today in her hip, moves to the buttock area. Patient is doing tylenol  and ibuprofen  and it is not helping at all- patient is a 9/10 pain today, pain is making her feel nauseous.  Patient is requesting some zofran  to have on hand if able, states when her anxiety gets bad she will get nauseous.    Medication Problem    Patient reports that she feels the lexapro  is not working. She is currently taking 10mg  and would like to discuss this today    Subjective     HPI     Follow-up    Additional comments: Patient is present for follow up of ED visit yesterday. Elevated BP and hip pain. Patient is in pain today in her hip, moves to the buttock area. Patient is doing tylenol  and ibuprofen  and it is not helping at all- patient is a 9/10 pain today, pain is making her feel nauseous.  Patient is requesting some zofran  to have on hand if able, states when her anxiety gets bad she will get nauseous.         Medication Problem    Additional comments: Patient reports that she feels the lexapro  is not working. She is currently taking 10mg  and would like to discuss this today       Last edited by Cherry Chiquita CHRISTELLA, CMA on 06/25/2024 11:17 AM.       Discussed the use of AI scribe software for clinical note transcription with the patient, who gave verbal consent to proceed.  History of Present Illness Erin [name] is a 53 year old female who presents with severe right hip pain.  She experiences severe right hip pain, rated as 9 out of 10, which is persistent and debilitating. Initially localized to the hip, the pain has since radiated to the buttock  area. Described as a 'tearing away' sensation, it significantly affects her ability to work, sleep, and perform daily activities such as standing and showering.  She has attempted to manage the pain with Tylenol , ibuprofen , and a 4% Salonpas pain patch, but these have provided minimal relief. She was prescribed meloxicam , which she has taken for two days without noticeable improvement. She reports that she has been on gabapentin  for over twenty years for back pain and is currently taking 100 mg capsules as prescribed by the ER doctor, but she is trying to get back to her previous dose of 200 mg capsules, two capsules three times a day.  She recalls a similar episode of hip pain approximately six to seven years ago, diagnosed as sciatica, though she does not remember the specific treatment provided at that time. 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  In addition to the hip pain, she experiences nausea due to discomfort and has difficulty sleeping. The pain is more constant today despite using a pain patch and taking Tylenol  and Aleve .  She also has a history of bipolar disorder and has been taking Lexapro  10 mg daily for the past two months without noticing any improvement in her symptoms. She has previously tried  Zoloft, Prozac, Seroquel, and Abilify without success. Evaluated in the ED on 06/24/24 for right thigh pain  Noted to have elevated blood pressure 140/86, BP improved today     Past Medical History:  Diagnosis Date   Anxiety disorder    Arthritis    Back pain, chronic    Bipolar disorder (HCC)    Cellulitis    Cervicalgia    Chicken pox    Chronic back pain    COPD (chronic obstructive pulmonary disease) (HCC)    Depression    Drug abuse (HCC)    GERD (gastroesophageal reflux disease)    Hx of migraines    Hyperlipidemia    Insomnia    Medical history non-contributory    Migraine    Nicotine  dependence    Seizures (HCC)    pt had 1 seizure  October 2012- no other history of seizures   Suicide attempt (HCC) 01/02/2020   Vitamin D  deficiency     Medications: Outpatient Medications Prior to Visit  Medication Sig   lidocaine  (LIDODERM ) 5 % Place 1 patch onto the skin daily for 10 days. Remove & Discard patch within 12 hours or as directed by MD   Magnesium  Glycinate 100 MG CAPS Take 200 mg by mouth at bedtime.   meloxicam  (MOBIC ) 15 MG tablet Take 1 tablet (15 mg total) by mouth daily for 14 days.   ondansetron  (ZOFRAN -ODT) 4 MG disintegrating tablet Take 1 tablet (4 mg total) by mouth every 8 (eight) hours as needed for nausea or vomiting.   [DISCONTINUED] escitalopram  (LEXAPRO ) 10 MG tablet Take 1 tablet (10 mg total) by mouth daily.   [DISCONTINUED] gabapentin  (NEURONTIN ) 100 MG capsule Take 2 capsules (200 mg total) by mouth 3 (three) times daily.   No facility-administered medications prior to visit.    Review of Systems  Last CBC Lab Results  Component Value Date   WBC 6.0 04/19/2024   HGB 12.1 04/19/2024   HCT 36.5 04/19/2024   MCV 92.4 04/19/2024   MCH 30.6 04/19/2024   RDW 11.6 04/19/2024   PLT 262 04/19/2024   Last metabolic panel Lab Results  Component Value Date   GLUCOSE 97 04/19/2024   NA 141 04/19/2024   K 4.8 04/19/2024   CL 109 04/19/2024   CO2 25 04/19/2024   BUN 17 04/19/2024   CREATININE 0.60 04/19/2024   GFRNONAA >60 04/19/2024   CALCIUM 8.8 (L) 04/19/2024   PHOS 4.2 03/25/2009   PROT 5.7 (L) 04/19/2024   ALBUMIN 3.4 (L) 04/19/2024   LABGLOB 2.7 04/14/2024   BILITOT 0.3 04/19/2024   ALKPHOS 61 04/19/2024   AST 21 04/19/2024   ALT 54 (H) 04/19/2024   ANIONGAP 7 04/19/2024   Last lipids Lab Results  Component Value Date   CHOL 251 (H) 04/14/2024   HDL 46 04/14/2024   LDLCALC 149 (H) 04/14/2024   TRIG 302 (H) 04/14/2024   CHOLHDL 5.5 (H) 04/14/2024   Last hemoglobin A1c No results found for: HGBA1C Last thyroid  functions Lab Results  Component Value Date   TSH 0.299 (L)  04/18/2024   Last vitamin D  Lab Results  Component Value Date   VD25OH 28 (L) 03/27/2013   Last vitamin B12 and Folate Lab Results  Component Value Date   VITAMINB12 434 04/20/2024   FOLATE  03/27/2009    2.8 (NOTE)  Reference Ranges        Deficient:       0.4 - 3.3 ng/mL  Indeterminate:   3.4 - 5.4 ng/mL        Normal:              > 5.4 ng/mL        Objective    BP 109/79 (BP Location: Left Arm, Patient Position: Sitting, Cuff Size: Normal)   Pulse 75   Temp 97.9 F (36.6 C) (Oral)   Ht 5' 9 (1.753 m)   Wt 124 lb 11.2 oz (56.6 kg)   SpO2 100%   BMI 18.41 kg/m  BP Readings from Last 3 Encounters:  06/25/24 109/79  06/24/24 (!) 140/86  04/20/24 121/85   Wt Readings from Last 3 Encounters:  06/25/24 124 lb 11.2 oz (56.6 kg)  04/18/24 130 lb (59 kg)  04/14/24 130 lb (59 kg)        Physical Exam  Physical Exam VITALS: BP- 109/79 MUSCULOSKELETAL: Normal range of motion of the hips and knees. 5/5 strength in bilateral lower extremities against resistance. No tenderness over the right greater trochanter. Negative bilateral straight leg raise tests. Negative piriformis test.    No results found for any visits on 06/25/24.  Assessment & Plan     Problem List Items Addressed This Visit     Bipolar 1 disorder, depressed (HCC)    Bipolar disorder Chronic condition  Bipolar disorder with inadequate response to Lexapro  10 mg daily over two months. Previous trials of Zoloft, Prozac, Seroquel, and Abilify. No contact from psychiatry for referral since July. - Increase Lexapro  to 20 mg daily. - Follow up on psychiatry referral for further managemen      Relevant Medications   escitalopram  (LEXAPRO ) 20 MG tablet   Other Visit Diagnoses       Acute right hip pain    -  Primary   Relevant Medications   gabapentin  (NEURONTIN ) 100 MG capsule   methylPREDNISolone  acetate (DEPO-MEDROL ) injection 40 mg (Completed)   traMADol  (ULTRAM ) 50 MG tablet   Other  Relevant Orders   AMB referral to orthopedics     Elevated blood pressure reading           Assessment and Plan Assessment & Plan Right hip and pelvic pain, possible labral tear Severe right hip and pelvic pain, rated 9/10, extending into the buttock with a tearing sensation. Unresponsive to Tylenol , ibuprofen , or meloxicam , affecting sleep and work. X-ray shows no fracture or dislocation, but a 6mm bone island near the pubis. Differential includes labral tear or soft tissue injury not visible on X-ray. Pain not consistent with sciatica or trochanteric bursitis. Hesitant about prednisone  due to previous side effects but agrees to steroid injection. - Administer Depo-Medrol  injection in office. - Prescribe tramadol  50 mg every 8 hours as needed for severe pain. - Refer urgently to orthopedics for evaluation of possible labral tear or soft tissue injury.      No follow-ups on file.         Rockie Agent, MD  [facility] [phone] (phone) [phone] (fax)  [facility]

## 2024-06-25 NOTE — Telephone Encounter (Unsigned)
 Copied from CRM #[id number]. Topic: Clinical - Medication Question >> Jun 25, 2024  4:09 PM [name] wrote: Reason for CRM: Pt states that she was prescribed prednizone to treat her sciatica as a trial for a few days and called to place the full refill, call her back if needed at [phone].

## 2024-06-26 ENCOUNTER — Telehealth: Payer: Self-pay

## 2024-06-26 MED ORDER — PREDNISONE 20 MG PO TABS: 40.0000 mg | ORAL_TABLET | Freq: Every day | ORAL | 0 refills | Status: AC

## 2024-06-26 NOTE — Telephone Encounter (Signed)
 Prednisone  40mg  daily for 5 days prescribed for hip pain

## 2024-06-26 NOTE — Telephone Encounter (Signed)
 Copied from CRM #[id number]. Topic: Clinical - Medication Question >> Jun 25, 2024  4:09 PM [name] wrote: Reason for CRM: Pt states that she was prescribed prednizone to treat her sciatica as a trial for a few days and called to place the full refill, call her back if needed at [phone]. >> Jun 26, 2024 12:49 PM [name] wrote: Erin [name] decided that she does want prednisone  pills to help with sciatica nerve. She had a prednisone  shot and it is not helping. Please call her at [phone] to discuss if she can have the medication or not. Thanks

## 2024-06-26 NOTE — Addendum Note (Signed)
 Addended by: SIMMONS-ROBINSON, Aleicia Kenagy L on: 06/26/2024 01:18 PM   Modules accepted: Orders

## 2024-06-26 NOTE — Telephone Encounter (Signed)
 Pt reports she has already picked up rx

## 2024-06-27 NOTE — Assessment & Plan Note (Signed)
  Bipolar disorder Chronic condition  Bipolar disorder with inadequate response to Lexapro  10 mg daily over two months. Previous trials of Zoloft, Prozac, Seroquel, and Abilify. No contact from psychiatry for referral since July. - Increase Lexapro  to 20 mg daily. - Follow up on psychiatry referral for further managemen

## 2024-07-06 ENCOUNTER — Telehealth: Payer: Self-pay | Admitting: Family Medicine

## 2024-07-06 NOTE — Telephone Encounter (Unsigned)
 Copied from CRM [id number]. Topic: Clinical - Medication Refill >> Jul 06, 2024 10:26 AM Avram MATSU wrote: Medication: traMADol  (ULTRAM ) 50 MG tablet [[id number]] ENDED  Has the patient contacted their pharmacy? No (Agent: If no, request that the patient contact the pharmacy for the refill. If patient does not wish to contact the pharmacy document the reason why and proceed with request.) (Agent: If yes, when and what did the pharmacy advise?)  This is the patient's preferred pharmacy:   [facility] [phone], [place] - [address] [place] [address] Phone: [phone] Fax: [phone]  Is this the correct pharmacy for this prescription? Yes If no, delete pharmacy and type the correct one.   Has the prescription been filled recently? Yes  Is the patient out of the medication? Yes  Has the patient been seen for an appointment in the last year OR does the patient have an upcoming appointment? Yes  Can we respond through MyChart? No  Agent: Please be advised that Rx refills may take up to 3 business days. We ask that you follow-up with your pharmacy.

## 2024-07-07 ENCOUNTER — Ambulatory Visit: Payer: MEDICAID | Admitting: Family Medicine

## 2024-10-06 ENCOUNTER — Ambulatory Visit: Payer: Self-pay | Admitting: Family Medicine

## 2024-10-06 ENCOUNTER — Other Ambulatory Visit: Payer: Self-pay | Admitting: Family Medicine

## 2024-10-06 DIAGNOSIS — M25551 Pain in right hip: Secondary | ICD-10-CM

## 2024-10-06 DIAGNOSIS — F319 Bipolar disorder, unspecified: Secondary | ICD-10-CM

## 2024-10-06 NOTE — Telephone Encounter (Signed)
 FYI Only or Action Required?: Action required by provider: medication refill request.  Patient was last seen in primary care on 06/25/2024 by Sharma Coyer, MD.  Called Nurse Triage reporting Medication Refill and Dizziness.  Symptoms began yesterday.  Interventions attempted: Rest, hydration, or home remedies.  Symptoms are: unchanged.  Triage Disposition: See PCP When Office is Open (Within 3 Days)  Patient/caregiver understands and will follow disposition?: Unsure    Reason for Disposition  [1] MILD dizziness (e.g., walking normally) AND [2] has NOT been evaluated by doctor (or NP/PA) for this  (Exception: Dizziness caused by heat exposure, sudden standing, or poor fluid intake.)  Answer Assessment - Initial Assessment Questions Additional info:  Patient calling in to request refill of Lexapro  and Gabapentin . She mentioned lightheadedness from being without medication and sent for nurse triage.  Erin [name] reports due to the holidays she had forgotten to request medication refills, she is on her 3rd day without SSRI and feeling effects of withdrawal she reports very mild lightheadedness that she typically will experience when out of medications. She denies all other symptoms.  Patient is willing for appointment if needed for med refills, next available appointment with pcp is December 22 2024. Please advise if appointment is needed for refill, patient requests call back today, she doesn't have MyChart access.    1. DESCRIPTION: Describe your dizziness.     Lightheaded  2. LIGHTHEADED: Do you feel lightheaded? (e.g., somewhat faint, woozy, weak upon standing)     Very slight lightheaded  3. VERTIGO: Do you feel like either you or the room is spinning or tilting? (i.e., vertigo)     denies 4. SEVERITY: How bad is it?  Do you feel like you are going to faint? Can you stand and walk?     mild 5. ONSET:  When did the dizziness begin?     2 days  6. AGGRAVATING  FACTORS: Does anything make it worse? (e.g., standing, change in head position)     No  7. HEART RATE: Can you tell me your heart rate? How many beats in 15 seconds?  (Note: Not all patients can do this.)       Feels normal 8. CAUSE: What do you think is causing the dizziness? (e.g., decreased fluids or food, diarrhea, emotional distress, heat exposure, new medicine, sudden standing, vomiting; unknown)     Out of medication 9. RECURRENT SYMPTOM: Have you had dizziness before? If Yes, ask: When was the last time? What happened that time?     denies 10. OTHER SYMPTOMS: Do you have any other symptoms? (e.g., fever, chest pain, vomiting, diarrhea, bleeding)       denies  Protocols used: Dizziness - Lightheadedness-A-AH  Copied from CRM #[id number]. Topic: Clinical - Red Word Triage >> Oct 06, 2024  8:35 AM Wess RAMAN wrote: Red Word that prompted transfer to Nurse Triage: Patient states she is feeling weird and light-headed and would like a refill of escitalopram  (LEXAPRO ) 20 MG tablet  Pharmacy: [facility] - [place], [place] - [address] [place] [address] Phone: [phone] Fax: [phone] Hours: Not open 24 hours

## 2024-10-06 NOTE — Telephone Encounter (Signed)
 LOV 06/25/24 NOV no new OV scheduled LRF 06/25/24 qty:120 r:0

## 2024-10-08 MED ORDER — ESCITALOPRAM OXALATE 20 MG PO TABS: 20.0000 mg | ORAL_TABLET | Freq: Every day | ORAL | 1 refills | Status: AC

## 2024-10-08 MED ORDER — GABAPENTIN 100 MG PO CAPS: 200.0000 mg | ORAL_CAPSULE | Freq: Three times a day (TID) | ORAL | 0 refills | Status: DC

## 2024-10-25 ENCOUNTER — Other Ambulatory Visit: Payer: Self-pay

## 2024-10-25 ENCOUNTER — Emergency Department
Admission: EM | Admit: 2024-10-25 | Discharge: 2024-10-25 | Disposition: A | Discharge status: Home / Self Care | Payer: MEDICAID | Attending: Emergency Medicine | Admitting: Emergency Medicine

## 2024-10-25 DIAGNOSIS — G43009 Migraine without aura, not intractable, without status migrainosus: Secondary | ICD-10-CM | POA: Insufficient documentation

## 2024-10-25 DIAGNOSIS — J449 Chronic obstructive pulmonary disease, unspecified: Secondary | ICD-10-CM | POA: Insufficient documentation

## 2024-10-25 DIAGNOSIS — R519 Headache, unspecified: Secondary | ICD-10-CM | POA: Diagnosis present

## 2024-10-25 MED ORDER — KETOROLAC TROMETHAMINE 15 MG/ML IJ SOLN
15.0000 mg | Freq: Once | INTRAMUSCULAR | Status: AC
  Administered 2024-10-25: 15 mg via INTRAVENOUS
  Filled 2024-10-25: qty 1

## 2024-10-25 MED ORDER — SODIUM CHLORIDE 0.9 % IV BOLUS
1000.0000 mL | Freq: Once | INTRAVENOUS | Status: AC
  Administered 2024-10-25: 1000 mL via INTRAVENOUS

## 2024-10-25 MED ORDER — METOCLOPRAMIDE HCL 5 MG/ML IJ SOLN
10.0000 mg | Freq: Once | INTRAMUSCULAR | Status: AC
  Administered 2024-10-25: 10 mg via INTRAVENOUS
  Filled 2024-10-25: qty 2

## 2024-10-25 MED ORDER — DIPHENHYDRAMINE HCL 50 MG/ML IJ SOLN
25.0000 mg | Freq: Once | INTRAMUSCULAR | Status: AC
  Administered 2024-10-25: 25 mg via INTRAVENOUS
  Filled 2024-10-25: qty 1

## 2024-10-25 NOTE — ED Triage Notes (Signed)
 Ptd to ED for headache since 2 days. Had influenza B before this and was getting better and then HA returned and she thinks this may be because she forgot to take her last Tamiflu dose.

## 2024-10-25 NOTE — ED Provider Notes (Signed)
 "  [facility] Provider Note    Event Date/Time   First MD Initiated Contact with Patient 10/25/24 1107     (approximate)   History   Headache   HPI  IONIA SCHEY is a 54 y.o. female   presents to the ED with complaint of headache for the last 2 days.  Patient was diagnosed with influenza B and was getting better.  She was placed on Tamiflu at the time she was diagnosed and missed 1 dose.  She states that the headache that she is experiencing today is more like her migraine headache.  Patient has history of migraines, anxiety and depression, history of polysubstance abuse, GERD, bipolar 1 disorder, COPD.      Physical Exam   Triage Vital Signs: ED Triage Vitals  Encounter Vitals Group     BP 10/25/24 1017 124/83     Girls Systolic BP Percentile --      Girls Diastolic BP Percentile --      Boys Systolic BP Percentile --      Boys Diastolic BP Percentile --      Pulse Rate 10/25/24 1016 (!) 105     Resp 10/25/24 1016 20     Temp 10/25/24 1017 97.7 F (36.5 C)     Temp Source 10/25/24 1017 Oral     SpO2 10/25/24 1016 97 %     Weight 10/25/24 1018 120 lb (54.4 kg)     Height 10/25/24 1018 5' 9 (1.753 m)     Head Circumference --      Peak Flow --      Pain Score 10/25/24 1016 8     Pain Loc --      Pain Education --      Exclude from Growth Chart --     Most recent vital signs: Vitals:   10/25/24 1016 10/25/24 1017  BP:  124/83  Pulse: (!) 105   Resp: 20   Temp:  97.7 F (36.5 C)  SpO2: 97%      General: Awake, no distress.  Positive photosensitivity.  Speech is normal and patient is able answer questions appropriately. CV:  Good peripheral perfusion.  Heart rate and rate rhythm. Resp:  Normal effort.  Lungs are clear bilaterally. Abd:  No distention.  Other:  PERRLA, EOMI's, cranial nerves II through XII grossly intact.  Patient is ambulatory without any assistance.   ED Results / Procedures / Treatments   Labs (all labs  ordered are listed, but only abnormal results are displayed) Labs Reviewed - No data to display    PROCEDURES:  Critical Care performed:   Procedures   MEDICATIONS ORDERED IN ED: Medications  sodium chloride  0.9 % bolus 1,000 mL (0 mLs Intravenous Stopped 10/25/24 1411)  metoCLOPramide  (REGLAN ) injection 10 mg (10 mg Intravenous Given 10/25/24 1258)  diphenhydrAMINE  (BENADRYL ) injection 25 mg (25 mg Intravenous Given 10/25/24 1258)  ketorolac  (TORADOL ) 15 MG/ML injection 15 mg (15 mg Intravenous Given 10/25/24 1258)     IMPRESSION / MDM / ASSESSMENT AND PLAN / ED COURSE  I reviewed the triage vital signs and the nursing notes.   Differential diagnosis includes, but is not limited to, migraine headache, cluster headache, generalized headache secondary to influenza B, dehydration.  54 year old female presents to the ED with complaint of headache for the last 2 days.  Patient was diagnosed with influenza B and has been taking Tamiflu.  Patient states that the headache she is experiencing at this time is  more like her migraine headache.  She is continue to be ambulatory without any assistance and drove herself to the emergency department.  Patient was given migraine cocktail and normal saline with cocktail including Reglan , Benadryl  and Toradol .  Patient voiced that she was feeling much better and arranged for a family member to pick her up.  She was instructed follow-up with her PCP if any continued problems.      Patient's presentation is most consistent with acute complicated illness / injury requiring diagnostic workup.  FINAL CLINICAL IMPRESSION(S) / ED DIAGNOSES   Final diagnoses:  Migraine without aura and without status migrainosus, not intractable     Rx / DC Orders   ED Discharge Orders     None        Note:  This document was prepared using Dragon voice recognition software and may include unintentional dictation errors.   Saunders Shona CROME, PA-C 10/25/24  1412    Jacolyn Pae, MD 10/25/24 1523  "

## 2024-10-25 NOTE — Discharge Instructions (Signed)
 Follow-up with your primary care provider if any continued problems or concerns.  Increase fluids to stay hydrated which also help with your headache.

## 2024-11-06 ENCOUNTER — Other Ambulatory Visit: Payer: Self-pay | Admitting: Family Medicine

## 2024-11-06 DIAGNOSIS — M25551 Pain in right hip: Secondary | ICD-10-CM

## 2024-11-06 NOTE — Telephone Encounter (Unsigned)
 Copied from CRM #[id number]. Topic: Clinical - Medication Refill >> Nov 06, 2024 11:16 AM Kendralyn S wrote: Medication: gabapentin  (NEURONTIN ) 100 MG capsule  Has the patient contacted their pharmacy? Yes (Agent: If no, request that the patient contact the pharmacy for the refill. If patient does not wish to contact the pharmacy document the reason why and proceed with request.) (Agent: If yes, when and what did the pharmacy advise?)  This is the patient's preferred pharmacy:  [facility] - [place], [place] - [address] [place] [address] Phone: [phone] Fax: [phone]   Is this the correct pharmacy for this prescription? Yes If no, delete pharmacy and type the correct one.   Has the prescription been filled recently? No  Is the patient out of the medication? Yes  Has the patient been seen for an appointment in the last year OR does the patient have an upcoming appointment? Yes  Can we respond through MyChart? Yes  Agent: Please be advised that Rx refills may take up to 3 business days. We ask that you follow-up with your pharmacy.

## 2024-11-09 ENCOUNTER — Telehealth: Payer: MEDICAID | Admitting: Internal Medicine

## 2024-11-09 MED ORDER — GABAPENTIN 100 MG PO CAPS: 200.0000 mg | ORAL_CAPSULE | Freq: Three times a day (TID) | ORAL | 0 refills | Status: AC

## 2024-11-10 NOTE — Progress Notes (Signed)
 This encounter was created in error - please disregard.
